# Patient Record
Sex: Female | Born: 1945 | Race: White | Hispanic: No | Smoking: Former smoker
Health system: Southern US, Community
[De-identification: ages and names within clinical notes are randomized; demographics above are authoritative.]

## PROBLEM LIST (undated history)

## (undated) DIAGNOSIS — N390 Urinary tract infection, site not specified: Secondary | ICD-10-CM

## (undated) DIAGNOSIS — M25519 Pain in unspecified shoulder: Secondary | ICD-10-CM

## (undated) DIAGNOSIS — M26609 Unspecified temporomandibular joint disorder, unspecified side: Secondary | ICD-10-CM

## (undated) DIAGNOSIS — E663 Overweight: Secondary | ICD-10-CM

## (undated) DIAGNOSIS — Z1211 Encounter for screening for malignant neoplasm of colon: Secondary | ICD-10-CM

## (undated) DIAGNOSIS — E785 Hyperlipidemia, unspecified: Secondary | ICD-10-CM

## (undated) DIAGNOSIS — K219 Gastro-esophageal reflux disease without esophagitis: Secondary | ICD-10-CM

## (undated) DIAGNOSIS — M1990 Unspecified osteoarthritis, unspecified site: Secondary | ICD-10-CM

## (undated) DIAGNOSIS — R739 Hyperglycemia, unspecified: Secondary | ICD-10-CM

## (undated) DIAGNOSIS — M542 Cervicalgia: Secondary | ICD-10-CM

## (undated) DIAGNOSIS — R945 Abnormal results of liver function studies: Secondary | ICD-10-CM

## (undated) DIAGNOSIS — N200 Calculus of kidney: Secondary | ICD-10-CM

## (undated) DIAGNOSIS — Z8619 Personal history of other infectious and parasitic diseases: Secondary | ICD-10-CM

## (undated) HISTORY — DX: Calculus of kidney: N20.0

## (undated) HISTORY — DX: Encounter for screening for malignant neoplasm of colon: Z12.11

## (undated) HISTORY — DX: Hyperlipidemia, unspecified: E78.5

## (undated) HISTORY — DX: Personal history of other infectious and parasitic diseases: Z86.19

## (undated) HISTORY — DX: Pain in unspecified shoulder: M25.519

## (undated) HISTORY — DX: Cervicalgia: M54.2

## (undated) HISTORY — DX: Unspecified temporomandibular joint disorder, unspecified side: M26.609

## (undated) HISTORY — DX: Gastro-esophageal reflux disease without esophagitis: K21.9

## (undated) HISTORY — DX: Urinary tract infection, site not specified: N39.0

## (undated) HISTORY — DX: Unspecified osteoarthritis, unspecified site: M19.90

## (undated) HISTORY — DX: Overweight: E66.3

## (undated) HISTORY — DX: Hyperglycemia, unspecified: R73.9

## (undated) HISTORY — DX: Abnormal results of liver function studies: R94.5

---

## 1964-11-29 HISTORY — PX: APPENDECTOMY: SHX54

## 1997-08-28 HISTORY — PX: OTHER SURGICAL HISTORY: SHX169

## 2000-07-22 ENCOUNTER — Emergency Department (HOSPITAL_COMMUNITY): Admission: EM | Admit: 2000-07-22 | Discharge: 2000-07-22 | Payer: Self-pay | Admitting: Emergency Medicine

## 2000-07-23 ENCOUNTER — Encounter: Payer: Self-pay | Admitting: Emergency Medicine

## 2002-08-28 HISTORY — PX: ULNAR SHORTENING WITH BONE GRAFT: SHX6330

## 2004-09-15 HISTORY — PX: CARPAL TUNNEL RELEASE: SHX101

## 2005-06-21 HISTORY — PX: ENDOSCOPIC PLANTAR FASCIOTOMY: SUR443

## 2006-05-12 HISTORY — PX: OTHER SURGICAL HISTORY: SHX169

## 2007-11-02 HISTORY — PX: OTHER SURGICAL HISTORY: SHX169

## 2008-08-07 ENCOUNTER — Encounter
Admission: RE | Admit: 2008-08-07 | Discharge: 2008-09-04 | Payer: Self-pay | Admitting: Physical Medicine and Rehabilitation

## 2008-10-02 ENCOUNTER — Encounter
Admission: RE | Admit: 2008-10-02 | Discharge: 2008-11-27 | Payer: Self-pay | Admitting: Physical Medicine & Rehabilitation

## 2008-12-04 ENCOUNTER — Encounter
Admission: RE | Admit: 2008-12-04 | Discharge: 2009-03-04 | Payer: Self-pay | Admitting: Physical Medicine & Rehabilitation

## 2009-02-27 HISTORY — PX: EXTRACORPOREAL SHOCK WAVE LITHOTRIPSY: SHX1557

## 2009-03-03 HISTORY — PX: CATARACT EXTRACTION: SUR2

## 2009-03-03 HISTORY — PX: OTHER SURGICAL HISTORY: SHX169

## 2009-03-17 HISTORY — PX: CATARACT EXTRACTION: SUR2

## 2009-04-07 ENCOUNTER — Encounter
Admission: RE | Admit: 2009-04-07 | Discharge: 2009-07-06 | Payer: Self-pay | Admitting: Physical Medicine & Rehabilitation

## 2010-03-11 HISTORY — PX: SEPTOPLASTY: SUR1290

## 2010-12-09 HISTORY — PX: SHOULDER SURGERY: SHX246

## 2010-12-22 ENCOUNTER — Encounter
Admission: RE | Admit: 2010-12-22 | Discharge: 2010-12-29 | Payer: Self-pay | Source: Home / Self Care | Attending: Sports Medicine | Admitting: Sports Medicine

## 2010-12-31 ENCOUNTER — Ambulatory Visit: Payer: Managed Care, Other (non HMO) | Attending: Sports Medicine | Admitting: Physical Therapy

## 2010-12-31 DIAGNOSIS — IMO0001 Reserved for inherently not codable concepts without codable children: Secondary | ICD-10-CM | POA: Insufficient documentation

## 2010-12-31 DIAGNOSIS — M6281 Muscle weakness (generalized): Secondary | ICD-10-CM | POA: Insufficient documentation

## 2010-12-31 DIAGNOSIS — M25519 Pain in unspecified shoulder: Secondary | ICD-10-CM | POA: Insufficient documentation

## 2010-12-31 DIAGNOSIS — M542 Cervicalgia: Secondary | ICD-10-CM | POA: Insufficient documentation

## 2010-12-31 DIAGNOSIS — M25619 Stiffness of unspecified shoulder, not elsewhere classified: Secondary | ICD-10-CM | POA: Insufficient documentation

## 2011-01-05 ENCOUNTER — Ambulatory Visit: Payer: Managed Care, Other (non HMO) | Admitting: Physical Therapy

## 2011-01-08 ENCOUNTER — Ambulatory Visit: Payer: Managed Care, Other (non HMO) | Admitting: Physical Therapy

## 2011-01-11 ENCOUNTER — Ambulatory Visit: Payer: Managed Care, Other (non HMO) | Admitting: Physical Therapy

## 2011-01-12 ENCOUNTER — Ambulatory Visit: Payer: Managed Care, Other (non HMO) | Admitting: Physical Therapy

## 2011-01-15 ENCOUNTER — Ambulatory Visit: Payer: Managed Care, Other (non HMO) | Admitting: Physical Therapy

## 2011-01-20 ENCOUNTER — Ambulatory Visit: Payer: Managed Care, Other (non HMO) | Admitting: Physical Therapy

## 2011-01-22 ENCOUNTER — Ambulatory Visit: Payer: Managed Care, Other (non HMO) | Admitting: Physical Therapy

## 2011-01-25 ENCOUNTER — Ambulatory Visit: Payer: Managed Care, Other (non HMO) | Admitting: Physical Therapy

## 2011-01-27 ENCOUNTER — Ambulatory Visit: Payer: Managed Care, Other (non HMO) | Admitting: Physical Therapy

## 2011-02-01 ENCOUNTER — Ambulatory Visit: Attending: Sports Medicine | Admitting: Physical Therapy

## 2011-02-01 DIAGNOSIS — M4802 Spinal stenosis, cervical region: Secondary | ICD-10-CM | POA: Insufficient documentation

## 2011-02-01 DIAGNOSIS — M25519 Pain in unspecified shoulder: Secondary | ICD-10-CM | POA: Insufficient documentation

## 2011-02-01 DIAGNOSIS — IMO0001 Reserved for inherently not codable concepts without codable children: Secondary | ICD-10-CM | POA: Insufficient documentation

## 2011-02-01 DIAGNOSIS — M6281 Muscle weakness (generalized): Secondary | ICD-10-CM | POA: Insufficient documentation

## 2011-02-01 DIAGNOSIS — M25619 Stiffness of unspecified shoulder, not elsewhere classified: Secondary | ICD-10-CM | POA: Insufficient documentation

## 2011-02-03 ENCOUNTER — Ambulatory Visit: Admitting: Physical Therapy

## 2011-02-08 ENCOUNTER — Ambulatory Visit: Admitting: Physical Therapy

## 2011-02-10 ENCOUNTER — Ambulatory Visit: Admitting: Physical Therapy

## 2011-02-15 ENCOUNTER — Ambulatory Visit: Admitting: Physical Therapy

## 2011-02-17 ENCOUNTER — Ambulatory Visit: Admitting: Physical Therapy

## 2011-02-23 ENCOUNTER — Ambulatory Visit: Admitting: Physical Therapy

## 2011-02-26 ENCOUNTER — Ambulatory Visit: Admitting: Physical Therapy

## 2011-03-15 ENCOUNTER — Ambulatory Visit: Attending: Physical Medicine & Rehabilitation | Admitting: Physical Therapy

## 2011-03-15 DIAGNOSIS — M25519 Pain in unspecified shoulder: Secondary | ICD-10-CM | POA: Insufficient documentation

## 2011-03-15 DIAGNOSIS — M4802 Spinal stenosis, cervical region: Secondary | ICD-10-CM | POA: Insufficient documentation

## 2011-03-15 DIAGNOSIS — M6281 Muscle weakness (generalized): Secondary | ICD-10-CM | POA: Insufficient documentation

## 2011-03-15 DIAGNOSIS — IMO0001 Reserved for inherently not codable concepts without codable children: Secondary | ICD-10-CM | POA: Insufficient documentation

## 2011-03-15 DIAGNOSIS — M25619 Stiffness of unspecified shoulder, not elsewhere classified: Secondary | ICD-10-CM | POA: Insufficient documentation

## 2011-03-19 ENCOUNTER — Ambulatory Visit: Admitting: Physical Therapy

## 2011-03-22 ENCOUNTER — Ambulatory Visit: Admitting: Physical Therapy

## 2011-03-25 ENCOUNTER — Ambulatory Visit: Admitting: Physical Therapy

## 2011-03-30 ENCOUNTER — Ambulatory Visit: Attending: Physical Medicine & Rehabilitation | Admitting: Physical Therapy

## 2011-03-30 DIAGNOSIS — M25619 Stiffness of unspecified shoulder, not elsewhere classified: Secondary | ICD-10-CM | POA: Insufficient documentation

## 2011-03-30 DIAGNOSIS — M542 Cervicalgia: Secondary | ICD-10-CM | POA: Insufficient documentation

## 2011-03-30 DIAGNOSIS — IMO0001 Reserved for inherently not codable concepts without codable children: Secondary | ICD-10-CM | POA: Insufficient documentation

## 2011-03-30 DIAGNOSIS — M25519 Pain in unspecified shoulder: Secondary | ICD-10-CM | POA: Insufficient documentation

## 2011-03-30 DIAGNOSIS — M6281 Muscle weakness (generalized): Secondary | ICD-10-CM | POA: Insufficient documentation

## 2011-03-30 DIAGNOSIS — M4802 Spinal stenosis, cervical region: Secondary | ICD-10-CM | POA: Insufficient documentation

## 2011-04-02 ENCOUNTER — Ambulatory Visit: Admitting: Physical Therapy

## 2011-04-05 ENCOUNTER — Ambulatory Visit: Attending: Physical Medicine & Rehabilitation | Admitting: Physical Therapy

## 2011-04-07 ENCOUNTER — Ambulatory Visit: Admitting: Physical Therapy

## 2011-05-10 ENCOUNTER — Ambulatory Visit: Attending: Physical Medicine & Rehabilitation | Admitting: Physical Therapy

## 2011-05-10 DIAGNOSIS — M25619 Stiffness of unspecified shoulder, not elsewhere classified: Secondary | ICD-10-CM | POA: Insufficient documentation

## 2011-05-10 DIAGNOSIS — M6281 Muscle weakness (generalized): Secondary | ICD-10-CM | POA: Insufficient documentation

## 2011-05-10 DIAGNOSIS — M25519 Pain in unspecified shoulder: Secondary | ICD-10-CM | POA: Insufficient documentation

## 2011-05-10 DIAGNOSIS — M4802 Spinal stenosis, cervical region: Secondary | ICD-10-CM | POA: Insufficient documentation

## 2011-05-10 DIAGNOSIS — IMO0001 Reserved for inherently not codable concepts without codable children: Secondary | ICD-10-CM | POA: Insufficient documentation

## 2011-05-13 ENCOUNTER — Ambulatory Visit: Admitting: Physical Therapy

## 2011-05-18 ENCOUNTER — Ambulatory Visit: Admitting: Physical Therapy

## 2011-05-20 ENCOUNTER — Ambulatory Visit: Admitting: Physical Therapy

## 2011-05-24 ENCOUNTER — Ambulatory Visit: Admitting: Physical Therapy

## 2011-05-25 ENCOUNTER — Encounter: Payer: Self-pay | Admitting: Physical Therapy

## 2011-05-27 ENCOUNTER — Ambulatory Visit: Admitting: Physical Therapy

## 2011-06-01 ENCOUNTER — Ambulatory Visit: Attending: Physical Medicine & Rehabilitation | Admitting: Physical Therapy

## 2011-06-01 DIAGNOSIS — M4802 Spinal stenosis, cervical region: Secondary | ICD-10-CM | POA: Insufficient documentation

## 2011-06-01 DIAGNOSIS — M25519 Pain in unspecified shoulder: Secondary | ICD-10-CM | POA: Insufficient documentation

## 2011-06-01 DIAGNOSIS — M25619 Stiffness of unspecified shoulder, not elsewhere classified: Secondary | ICD-10-CM | POA: Insufficient documentation

## 2011-06-01 DIAGNOSIS — M6281 Muscle weakness (generalized): Secondary | ICD-10-CM | POA: Insufficient documentation

## 2011-06-01 DIAGNOSIS — IMO0001 Reserved for inherently not codable concepts without codable children: Secondary | ICD-10-CM | POA: Insufficient documentation

## 2011-06-03 ENCOUNTER — Ambulatory Visit: Admitting: Physical Therapy

## 2011-07-27 ENCOUNTER — Ambulatory Visit: Attending: Physical Medicine & Rehabilitation | Admitting: Physical Therapy

## 2011-07-27 DIAGNOSIS — M25619 Stiffness of unspecified shoulder, not elsewhere classified: Secondary | ICD-10-CM | POA: Insufficient documentation

## 2011-07-27 DIAGNOSIS — M6281 Muscle weakness (generalized): Secondary | ICD-10-CM | POA: Insufficient documentation

## 2011-07-27 DIAGNOSIS — M542 Cervicalgia: Secondary | ICD-10-CM | POA: Insufficient documentation

## 2011-07-27 DIAGNOSIS — M25519 Pain in unspecified shoulder: Secondary | ICD-10-CM | POA: Insufficient documentation

## 2011-07-27 DIAGNOSIS — M4802 Spinal stenosis, cervical region: Secondary | ICD-10-CM | POA: Insufficient documentation

## 2011-07-27 DIAGNOSIS — IMO0001 Reserved for inherently not codable concepts without codable children: Secondary | ICD-10-CM | POA: Insufficient documentation

## 2011-07-29 ENCOUNTER — Ambulatory Visit: Attending: Physical Medicine & Rehabilitation | Admitting: Physical Therapy

## 2011-07-29 DIAGNOSIS — M6281 Muscle weakness (generalized): Secondary | ICD-10-CM | POA: Insufficient documentation

## 2011-07-29 DIAGNOSIS — R293 Abnormal posture: Secondary | ICD-10-CM | POA: Insufficient documentation

## 2011-07-29 DIAGNOSIS — M546 Pain in thoracic spine: Secondary | ICD-10-CM | POA: Insufficient documentation

## 2011-07-29 DIAGNOSIS — M4802 Spinal stenosis, cervical region: Secondary | ICD-10-CM | POA: Insufficient documentation

## 2011-07-29 DIAGNOSIS — M542 Cervicalgia: Secondary | ICD-10-CM | POA: Insufficient documentation

## 2011-07-29 DIAGNOSIS — IMO0001 Reserved for inherently not codable concepts without codable children: Secondary | ICD-10-CM | POA: Insufficient documentation

## 2011-08-03 ENCOUNTER — Ambulatory Visit: Attending: Physical Medicine & Rehabilitation | Admitting: Physical Therapy

## 2011-08-03 DIAGNOSIS — R293 Abnormal posture: Secondary | ICD-10-CM | POA: Insufficient documentation

## 2011-08-03 DIAGNOSIS — M546 Pain in thoracic spine: Secondary | ICD-10-CM | POA: Insufficient documentation

## 2011-08-03 DIAGNOSIS — IMO0001 Reserved for inherently not codable concepts without codable children: Secondary | ICD-10-CM | POA: Insufficient documentation

## 2011-08-03 DIAGNOSIS — M542 Cervicalgia: Secondary | ICD-10-CM | POA: Insufficient documentation

## 2011-08-03 DIAGNOSIS — M6281 Muscle weakness (generalized): Secondary | ICD-10-CM | POA: Insufficient documentation

## 2011-08-03 DIAGNOSIS — M4802 Spinal stenosis, cervical region: Secondary | ICD-10-CM | POA: Insufficient documentation

## 2011-08-06 ENCOUNTER — Ambulatory Visit: Admitting: Physical Therapy

## 2011-08-10 ENCOUNTER — Ambulatory Visit: Admitting: Physical Therapy

## 2011-08-12 ENCOUNTER — Ambulatory Visit: Admitting: Physical Therapy

## 2011-08-16 ENCOUNTER — Ambulatory Visit: Admitting: Physical Therapy

## 2011-08-19 ENCOUNTER — Ambulatory Visit: Admitting: Physical Therapy

## 2011-08-24 ENCOUNTER — Ambulatory Visit: Admitting: Physical Therapy

## 2011-08-26 ENCOUNTER — Ambulatory Visit: Admitting: Physical Therapy

## 2011-08-31 ENCOUNTER — Ambulatory Visit: Attending: Physical Medicine & Rehabilitation | Admitting: Physical Therapy

## 2011-08-31 DIAGNOSIS — R293 Abnormal posture: Secondary | ICD-10-CM | POA: Insufficient documentation

## 2011-08-31 DIAGNOSIS — M6281 Muscle weakness (generalized): Secondary | ICD-10-CM | POA: Insufficient documentation

## 2011-08-31 DIAGNOSIS — M546 Pain in thoracic spine: Secondary | ICD-10-CM | POA: Insufficient documentation

## 2011-08-31 DIAGNOSIS — M4802 Spinal stenosis, cervical region: Secondary | ICD-10-CM | POA: Insufficient documentation

## 2011-08-31 DIAGNOSIS — M542 Cervicalgia: Secondary | ICD-10-CM | POA: Insufficient documentation

## 2011-08-31 DIAGNOSIS — IMO0001 Reserved for inherently not codable concepts without codable children: Secondary | ICD-10-CM | POA: Insufficient documentation

## 2011-09-02 ENCOUNTER — Ambulatory Visit: Admitting: Physical Therapy

## 2011-09-07 ENCOUNTER — Ambulatory Visit: Admitting: Physical Therapy

## 2011-11-03 DIAGNOSIS — M25562 Pain in left knee: Secondary | ICD-10-CM | POA: Insufficient documentation

## 2012-03-02 DIAGNOSIS — S4392XA Sprain of unspecified parts of left shoulder girdle, initial encounter: Secondary | ICD-10-CM | POA: Insufficient documentation

## 2012-03-02 DIAGNOSIS — IMO0002 Reserved for concepts with insufficient information to code with codable children: Secondary | ICD-10-CM | POA: Insufficient documentation

## 2012-03-09 ENCOUNTER — Ambulatory Visit: Attending: Physical Medicine & Rehabilitation | Admitting: Physical Therapy

## 2012-03-09 DIAGNOSIS — M2560 Stiffness of unspecified joint, not elsewhere classified: Secondary | ICD-10-CM | POA: Insufficient documentation

## 2012-03-09 DIAGNOSIS — M4802 Spinal stenosis, cervical region: Secondary | ICD-10-CM | POA: Insufficient documentation

## 2012-03-09 DIAGNOSIS — IMO0001 Reserved for inherently not codable concepts without codable children: Secondary | ICD-10-CM | POA: Insufficient documentation

## 2012-03-09 DIAGNOSIS — M2550 Pain in unspecified joint: Secondary | ICD-10-CM | POA: Insufficient documentation

## 2012-03-09 DIAGNOSIS — M6281 Muscle weakness (generalized): Secondary | ICD-10-CM | POA: Insufficient documentation

## 2012-03-09 DIAGNOSIS — R293 Abnormal posture: Secondary | ICD-10-CM | POA: Insufficient documentation

## 2012-03-14 ENCOUNTER — Ambulatory Visit: Attending: Physical Medicine & Rehabilitation | Admitting: Physical Therapy

## 2012-03-14 DIAGNOSIS — M2550 Pain in unspecified joint: Secondary | ICD-10-CM | POA: Insufficient documentation

## 2012-03-14 DIAGNOSIS — M4802 Spinal stenosis, cervical region: Secondary | ICD-10-CM | POA: Insufficient documentation

## 2012-03-14 DIAGNOSIS — IMO0001 Reserved for inherently not codable concepts without codable children: Secondary | ICD-10-CM | POA: Insufficient documentation

## 2012-03-14 DIAGNOSIS — R293 Abnormal posture: Secondary | ICD-10-CM | POA: Insufficient documentation

## 2012-03-14 DIAGNOSIS — M2560 Stiffness of unspecified joint, not elsewhere classified: Secondary | ICD-10-CM | POA: Insufficient documentation

## 2012-03-14 DIAGNOSIS — M6281 Muscle weakness (generalized): Secondary | ICD-10-CM | POA: Insufficient documentation

## 2012-03-16 ENCOUNTER — Ambulatory Visit: Admitting: Physical Therapy

## 2012-03-21 ENCOUNTER — Ambulatory Visit: Admitting: Physical Therapy

## 2012-03-23 ENCOUNTER — Ambulatory Visit: Admitting: Physical Therapy

## 2012-03-28 ENCOUNTER — Ambulatory Visit: Admitting: Physical Therapy

## 2012-03-30 ENCOUNTER — Ambulatory Visit: Attending: Physical Medicine & Rehabilitation | Admitting: Physical Therapy

## 2012-03-30 DIAGNOSIS — R293 Abnormal posture: Secondary | ICD-10-CM | POA: Insufficient documentation

## 2012-03-30 DIAGNOSIS — IMO0001 Reserved for inherently not codable concepts without codable children: Secondary | ICD-10-CM | POA: Insufficient documentation

## 2012-03-30 DIAGNOSIS — M2560 Stiffness of unspecified joint, not elsewhere classified: Secondary | ICD-10-CM | POA: Insufficient documentation

## 2012-03-30 DIAGNOSIS — M2550 Pain in unspecified joint: Secondary | ICD-10-CM | POA: Insufficient documentation

## 2012-03-30 DIAGNOSIS — M6281 Muscle weakness (generalized): Secondary | ICD-10-CM | POA: Insufficient documentation

## 2012-03-30 DIAGNOSIS — M4802 Spinal stenosis, cervical region: Secondary | ICD-10-CM | POA: Insufficient documentation

## 2012-04-03 ENCOUNTER — Ambulatory Visit: Admitting: Physical Therapy

## 2012-10-19 DIAGNOSIS — M5412 Radiculopathy, cervical region: Secondary | ICD-10-CM | POA: Insufficient documentation

## 2012-10-19 DIAGNOSIS — G250 Essential tremor: Secondary | ICD-10-CM | POA: Insufficient documentation

## 2012-10-24 ENCOUNTER — Ambulatory Visit: Attending: Physical Medicine & Rehabilitation | Admitting: Physical Therapy

## 2012-10-24 ENCOUNTER — Ambulatory Visit: Payer: Self-pay | Admitting: Physical Therapy

## 2012-10-24 DIAGNOSIS — R209 Unspecified disturbances of skin sensation: Secondary | ICD-10-CM | POA: Insufficient documentation

## 2012-10-24 DIAGNOSIS — M6281 Muscle weakness (generalized): Secondary | ICD-10-CM | POA: Insufficient documentation

## 2012-10-24 DIAGNOSIS — M542 Cervicalgia: Secondary | ICD-10-CM | POA: Insufficient documentation

## 2012-10-24 DIAGNOSIS — M4802 Spinal stenosis, cervical region: Secondary | ICD-10-CM | POA: Insufficient documentation

## 2012-10-24 DIAGNOSIS — IMO0001 Reserved for inherently not codable concepts without codable children: Secondary | ICD-10-CM | POA: Insufficient documentation

## 2012-10-24 DIAGNOSIS — R293 Abnormal posture: Secondary | ICD-10-CM | POA: Insufficient documentation

## 2012-10-30 ENCOUNTER — Ambulatory Visit: Attending: Physical Medicine & Rehabilitation | Admitting: Physical Therapy

## 2012-10-30 DIAGNOSIS — M6281 Muscle weakness (generalized): Secondary | ICD-10-CM | POA: Insufficient documentation

## 2012-10-30 DIAGNOSIS — M542 Cervicalgia: Secondary | ICD-10-CM | POA: Insufficient documentation

## 2012-10-30 DIAGNOSIS — R209 Unspecified disturbances of skin sensation: Secondary | ICD-10-CM | POA: Insufficient documentation

## 2012-10-30 DIAGNOSIS — M4802 Spinal stenosis, cervical region: Secondary | ICD-10-CM | POA: Insufficient documentation

## 2012-10-30 DIAGNOSIS — R293 Abnormal posture: Secondary | ICD-10-CM | POA: Insufficient documentation

## 2012-10-30 DIAGNOSIS — IMO0001 Reserved for inherently not codable concepts without codable children: Secondary | ICD-10-CM | POA: Insufficient documentation

## 2012-11-02 ENCOUNTER — Ambulatory Visit: Admitting: Physical Therapy

## 2012-11-06 ENCOUNTER — Ambulatory Visit: Attending: Physical Medicine & Rehabilitation | Admitting: Physical Therapy

## 2012-11-06 DIAGNOSIS — R209 Unspecified disturbances of skin sensation: Secondary | ICD-10-CM | POA: Insufficient documentation

## 2012-11-06 DIAGNOSIS — IMO0001 Reserved for inherently not codable concepts without codable children: Secondary | ICD-10-CM | POA: Insufficient documentation

## 2012-11-06 DIAGNOSIS — M6281 Muscle weakness (generalized): Secondary | ICD-10-CM | POA: Insufficient documentation

## 2012-11-06 DIAGNOSIS — M542 Cervicalgia: Secondary | ICD-10-CM | POA: Insufficient documentation

## 2012-11-06 DIAGNOSIS — R293 Abnormal posture: Secondary | ICD-10-CM | POA: Insufficient documentation

## 2012-11-06 DIAGNOSIS — M4802 Spinal stenosis, cervical region: Secondary | ICD-10-CM | POA: Insufficient documentation

## 2012-11-08 ENCOUNTER — Ambulatory Visit: Attending: Physical Medicine & Rehabilitation | Admitting: Physical Therapy

## 2012-11-08 DIAGNOSIS — IMO0001 Reserved for inherently not codable concepts without codable children: Secondary | ICD-10-CM | POA: Insufficient documentation

## 2012-11-08 DIAGNOSIS — M4802 Spinal stenosis, cervical region: Secondary | ICD-10-CM | POA: Insufficient documentation

## 2012-11-08 DIAGNOSIS — R209 Unspecified disturbances of skin sensation: Secondary | ICD-10-CM | POA: Insufficient documentation

## 2012-11-08 DIAGNOSIS — R293 Abnormal posture: Secondary | ICD-10-CM | POA: Insufficient documentation

## 2012-11-08 DIAGNOSIS — M542 Cervicalgia: Secondary | ICD-10-CM | POA: Insufficient documentation

## 2012-11-08 DIAGNOSIS — M6281 Muscle weakness (generalized): Secondary | ICD-10-CM | POA: Insufficient documentation

## 2013-03-27 HISTORY — PX: TMJ ARTHROPLASTY: SHX1066

## 2013-05-07 DIAGNOSIS — R252 Cramp and spasm: Secondary | ICD-10-CM | POA: Insufficient documentation

## 2013-10-09 ENCOUNTER — Ambulatory Visit (INDEPENDENT_AMBULATORY_CARE_PROVIDER_SITE_OTHER): Payer: PRIVATE HEALTH INSURANCE

## 2013-10-09 VITALS — Ht 66.0 in | Wt 157.0 lb

## 2013-10-09 DIAGNOSIS — R52 Pain, unspecified: Secondary | ICD-10-CM

## 2013-10-09 DIAGNOSIS — M722 Plantar fascial fibromatosis: Secondary | ICD-10-CM

## 2013-10-09 MED ORDER — PREDNISONE 10 MG PO KIT: 1.0000 | PACK | Freq: Four times a day (QID) | ORAL | Status: DC

## 2013-10-09 NOTE — Progress Notes (Signed)
  Subjective:    Patient ID: Rebecca Lewis, female    DOB: 05-14-1946, 67 y.o.   MRN: [id number]  HPI Comments: '' THE RT BOTTOM OF THE HEEL IS HURTING SINCE MAY OF THIS YEAR. FLEXING AND PRESSURE  MAKE IT WORSE. I TREAT WITH ICE FOR 20 MINUTES, BUT NO RELIEF.''     Review of Systems  All other systems reviewed and are negative.       Objective:   Physical Exam Neurovascular status is intact. Pedal pulses palpable epicritic and proprioceptive sensations intact. X-rays taken this time reveal mild inferior calcaneal spurring no fracture roughly high arch cavovarus foot type with mild flexible digital contractures noted on projection patient still wearing orthoses which actually fit and contour well although maybe flattening somewhat over time. 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 Also recommended warm compress ice pack to the inferior heel area a. Milt Coye shell arch pad is also applied to her right orthotic to assist with he'll support this may indicate she may need new orthoses based on progress recheck in the next one to 2 months for followup and reevaluation.  Alvan Dame DPM

## 2013-10-09 NOTE — Patient Instructions (Signed)
ICE INSTRUCTIONS  Apply ice or cold pack to the affected area at least 3 times a day for 10-15 minutes each time.  You should also use ice after prolonged activity or vigorous exercise.  Do not apply ice longer than 20 minutes at one time.  Always keep a cloth between your skin and the ice pack to prevent burns.  Being consistent and following these instructions will help control your symptoms.  We suggest you purchase a gel ice pack because they are reusable and do bit leak.  Some of them are designed to wrap around the area.  Use the method that works best for you.  Here are some other suggestions for icing.   Use a frozen bag of peas or corn-inexpensive and molds well to your body, usually stays frozen for 10 to 20 minutes.  Wet a towel with cold water and squeeze out the excess until it's damp.  Place in a bag in the freezer for 20 minutes. Then remove and use.   Alternate hot and cold compresses: Apply a washcloth moistened with hot water for 10 minutes then apply ice pack for 10 minutes repeat this 2-3 times. Again alternate hot and cold hot and cold always finish with cold

## 2013-11-06 ENCOUNTER — Ambulatory Visit: Payer: PRIVATE HEALTH INSURANCE

## 2013-11-14 ENCOUNTER — Ambulatory Visit (INDEPENDENT_AMBULATORY_CARE_PROVIDER_SITE_OTHER): Payer: PRIVATE HEALTH INSURANCE

## 2013-11-14 VITALS — BP 116/60 | HR 74 | Resp 16 | Ht 66.5 in | Wt 160.0 lb

## 2013-11-14 DIAGNOSIS — R52 Pain, unspecified: Secondary | ICD-10-CM

## 2013-11-14 DIAGNOSIS — M722 Plantar fascial fibromatosis: Secondary | ICD-10-CM

## 2013-11-14 MED ORDER — MELOXICAM 15 MG PO TABS: 15.0000 mg | ORAL_TABLET | Freq: Every day | ORAL | Status: DC

## 2013-11-14 NOTE — Progress Notes (Signed)
   Subjective:    Patient ID: Ariday Brinker, female    DOB: 1946/05/08, 67 y.o.   MRN: [id number]                                   "It's doing better but it's still there.  I got the results for the Uric acid, it was 3.6.  I want him to make an adjustment to my old pair of orthotics too."  HPI    Review of Systems  All other systems reviewed and are negative.       Objective:   Physical Exam Neurovascular status is intact. Patient continues to have some mild plantar fascial pain medial right arch the freezer tonic was adjusted this time felt had corporations and into the medial arch of the right orthotic another pair of orthotics. Maintain orthotics also maintain heat and ice therapy every evening. There is no other significant changes to have some improvement with the orthotic adjustment. Discussed positive for steroid injection however we'll consider this on an as-needed basis in the future Dosepak definitely helped in reducing the symptomology.       Assessment & Plan:  Assessment plantar fasciitis/heel spur syndrome right heel exacerbated recently treated with an Dosepak and some Desitin orthotics which are carried out at this time. Patient will recheck in the future and as-needed basis suggest a possible 3 months for long-term followup.  For Mobic is given as alternative to naproxen sodium patient been using over-the-counter Aleve utilizing high doses of sodium with her medications as doubled doubling on the proximal and suggested as Mobic as alternative 50 mg once daily reduce the amount sodium and reducible swelling in her ankles as well.  Alvan Dame DPM

## 2013-11-14 NOTE — Patient Instructions (Signed)

## 2013-12-10 ENCOUNTER — Telehealth: Payer: Self-pay | Admitting: *Deleted

## 2013-12-10 NOTE — Telephone Encounter (Signed)
9:57am Patient called and stated I'm having side effects from the Mobic so I stopped taking it.  I had some Voltaren Gel that was prescribed from another doctor that I've been using.  It seems to help.  My question is does he think physical therapy will help.  I have someone in mind I'd like to use if so.  Or, does he want me to come in to see him again?  I called and informed her I'm waiting on a response from Dr. Ralene CorkSikora.

## 2013-12-10 NOTE — Telephone Encounter (Signed)
Entered in error

## 2013-12-11 NOTE — Telephone Encounter (Signed)
Yes physical therapy for her heel and arch is may be beneficial. If she has some edema in the can provide physical therapy be a good option clubbing noted anything that can do to help with that therapy.  I agree discontinue the Mobic if is causing any GI side effects or other symptoms. Use the Voltaren gel as alternative.

## 2013-12-13 ENCOUNTER — Telehealth: Payer: Self-pay | Admitting: *Deleted

## 2013-12-13 DIAGNOSIS — M722 Plantar fascial fibromatosis: Secondary | ICD-10-CM

## 2013-12-13 MED ORDER — DICLOFENAC SODIUM 1 % TD GEL: 4.0000 g | Freq: Two times a day (BID) | TRANSDERMAL | Status: DC

## 2013-12-13 NOTE — Telephone Encounter (Signed)
I called and informed the patient that Dr. Ralene CorkSikora said it was good to have stopped the Mobic.  Voltaren Gel is good to use.  Physical therapy is a good option.  Patient requested a prescription for Voltaren Gel to [facility] [address]  [place], [place] [phone].  She also requests physical therapy from [facility] located at [address] [place], [place] phone # [phone], fax [phone].  She asked that we make sure physical therapy knows that it's workers comp.  I informed her I would get it taken care of as soon as Dr. Ralene CorkSikora gives me details for scheduling and okay for the prescription.

## 2013-12-13 NOTE — Telephone Encounter (Signed)
Blood Lydia as far as Ms. Silverio DecampLinda Mose  Voltaren gel apply a 4 mg to the affected area every 12 hours dispensed 30 day supply with 2 refill.  For physical therapy arrange physical therapy to evaluate and treat the plantar fascial area as follows ultrasound to the heel and arch. Also recommended iontophoresis with dexamethasone 4 mg per mL. Also stretching and range of motion exercises. Recommend physical therapy treat times a week for 4 week duration.  Alvan Dameichard Beverly Suriano DPM

## 2013-12-14 ENCOUNTER — Telehealth: Payer: Self-pay | Admitting: *Deleted

## 2013-12-14 NOTE — Telephone Encounter (Signed)
LM for pt. Informed her that Dr. Ralene CorkSikora did think that PT would help and to let us know if we needed to call the facility of her choice to set her up. Also informed pt that the discontinue Mobic and use Voltaren gel as an alternative per Dr Ralene CorkSikora.

## 2013-12-19 ENCOUNTER — Telehealth: Payer: Self-pay | Admitting: *Deleted

## 2013-12-20 NOTE — Telephone Encounter (Signed)
Faxed Rx for PT - Physical Therapy to include Iontophoresis with Dexamethasone 4mg  per ml 3 times a week for 4 weeks duration for right foot plantar fasciitis, to [phone].

## 2013-12-25 ENCOUNTER — Telehealth: Payer: Self-pay | Admitting: *Deleted

## 2013-12-25 NOTE — Telephone Encounter (Addendum)
Rebecca Lewis states they need more information for pt's PT for the [employer].  I left message to call and we would go over one-on-one.  Rebecca Lewis states [employer] requires specific orders from the referring doctor - doctor's handwritten signature, evaluate and treat for plantar fasciitis of designated foot to include Iontophoresis with Dexamethasone, and ICD9 code.  I wrote as:  Evaluate and treat for plantar fasciitis of the right foot to include Iontophoresis wiith Dexamethasone.  ICD9 code 22728.71.  I will have Rebecca Lewis sign.  See scanned rx dated 12/26/2013

## 2014-01-15 ENCOUNTER — Ambulatory Visit: Payer: Self-pay | Admitting: Physical Therapy

## 2014-01-17 ENCOUNTER — Ambulatory Visit (INDEPENDENT_AMBULATORY_CARE_PROVIDER_SITE_OTHER): Admitting: Physical Therapy

## 2014-01-17 DIAGNOSIS — M25579 Pain in unspecified ankle and joints of unspecified foot: Secondary | ICD-10-CM

## 2014-01-17 DIAGNOSIS — M6281 Muscle weakness (generalized): Secondary | ICD-10-CM

## 2014-01-17 DIAGNOSIS — M722 Plantar fascial fibromatosis: Secondary | ICD-10-CM

## 2014-01-17 DIAGNOSIS — M25673 Stiffness of unspecified ankle, not elsewhere classified: Secondary | ICD-10-CM

## 2014-01-17 DIAGNOSIS — M25676 Stiffness of unspecified foot, not elsewhere classified: Secondary | ICD-10-CM

## 2014-01-18 ENCOUNTER — Encounter: Payer: Self-pay | Admitting: Physical Therapy

## 2014-01-18 ENCOUNTER — Encounter (INDEPENDENT_AMBULATORY_CARE_PROVIDER_SITE_OTHER): Admitting: Physical Therapy

## 2014-01-18 DIAGNOSIS — M25673 Stiffness of unspecified ankle, not elsewhere classified: Secondary | ICD-10-CM

## 2014-01-18 DIAGNOSIS — M25579 Pain in unspecified ankle and joints of unspecified foot: Secondary | ICD-10-CM

## 2014-01-18 DIAGNOSIS — M722 Plantar fascial fibromatosis: Secondary | ICD-10-CM

## 2014-01-18 DIAGNOSIS — M6281 Muscle weakness (generalized): Secondary | ICD-10-CM

## 2014-01-18 DIAGNOSIS — M25676 Stiffness of unspecified foot, not elsewhere classified: Secondary | ICD-10-CM

## 2014-01-21 ENCOUNTER — Encounter (INDEPENDENT_AMBULATORY_CARE_PROVIDER_SITE_OTHER): Admitting: Physical Therapy

## 2014-01-21 DIAGNOSIS — M6281 Muscle weakness (generalized): Secondary | ICD-10-CM

## 2014-01-21 DIAGNOSIS — M25676 Stiffness of unspecified foot, not elsewhere classified: Secondary | ICD-10-CM

## 2014-01-21 DIAGNOSIS — M25579 Pain in unspecified ankle and joints of unspecified foot: Secondary | ICD-10-CM

## 2014-01-21 DIAGNOSIS — M25673 Stiffness of unspecified ankle, not elsewhere classified: Secondary | ICD-10-CM

## 2014-01-23 ENCOUNTER — Encounter (INDEPENDENT_AMBULATORY_CARE_PROVIDER_SITE_OTHER): Admitting: Physical Therapy

## 2014-01-23 DIAGNOSIS — M25673 Stiffness of unspecified ankle, not elsewhere classified: Secondary | ICD-10-CM

## 2014-01-23 DIAGNOSIS — M25676 Stiffness of unspecified foot, not elsewhere classified: Secondary | ICD-10-CM

## 2014-01-23 DIAGNOSIS — M722 Plantar fascial fibromatosis: Secondary | ICD-10-CM

## 2014-01-23 DIAGNOSIS — M6281 Muscle weakness (generalized): Secondary | ICD-10-CM

## 2014-01-23 DIAGNOSIS — M25579 Pain in unspecified ankle and joints of unspecified foot: Secondary | ICD-10-CM

## 2014-01-25 ENCOUNTER — Encounter (INDEPENDENT_AMBULATORY_CARE_PROVIDER_SITE_OTHER): Admitting: Physical Therapy

## 2014-01-25 DIAGNOSIS — M6281 Muscle weakness (generalized): Secondary | ICD-10-CM

## 2014-01-25 DIAGNOSIS — M25673 Stiffness of unspecified ankle, not elsewhere classified: Secondary | ICD-10-CM

## 2014-01-25 DIAGNOSIS — M12819 Other specific arthropathies, not elsewhere classified, unspecified shoulder: Secondary | ICD-10-CM

## 2014-01-25 DIAGNOSIS — M722 Plantar fascial fibromatosis: Secondary | ICD-10-CM

## 2014-01-25 DIAGNOSIS — M25676 Stiffness of unspecified foot, not elsewhere classified: Secondary | ICD-10-CM

## 2014-01-28 ENCOUNTER — Encounter (INDEPENDENT_AMBULATORY_CARE_PROVIDER_SITE_OTHER): Admitting: Physical Therapy

## 2014-01-28 DIAGNOSIS — M25676 Stiffness of unspecified foot, not elsewhere classified: Secondary | ICD-10-CM

## 2014-01-28 DIAGNOSIS — M25579 Pain in unspecified ankle and joints of unspecified foot: Secondary | ICD-10-CM

## 2014-01-28 DIAGNOSIS — M6281 Muscle weakness (generalized): Secondary | ICD-10-CM

## 2014-01-28 DIAGNOSIS — M722 Plantar fascial fibromatosis: Secondary | ICD-10-CM

## 2014-01-28 DIAGNOSIS — M25673 Stiffness of unspecified ankle, not elsewhere classified: Secondary | ICD-10-CM

## 2014-01-30 ENCOUNTER — Encounter (INDEPENDENT_AMBULATORY_CARE_PROVIDER_SITE_OTHER): Admitting: Physical Therapy

## 2014-01-30 DIAGNOSIS — M25579 Pain in unspecified ankle and joints of unspecified foot: Secondary | ICD-10-CM

## 2014-01-30 DIAGNOSIS — M722 Plantar fascial fibromatosis: Secondary | ICD-10-CM

## 2014-01-30 DIAGNOSIS — M25673 Stiffness of unspecified ankle, not elsewhere classified: Secondary | ICD-10-CM

## 2014-01-30 DIAGNOSIS — M6281 Muscle weakness (generalized): Secondary | ICD-10-CM

## 2014-01-30 DIAGNOSIS — M25676 Stiffness of unspecified foot, not elsewhere classified: Secondary | ICD-10-CM

## 2014-01-31 ENCOUNTER — Encounter (INDEPENDENT_AMBULATORY_CARE_PROVIDER_SITE_OTHER)

## 2014-01-31 DIAGNOSIS — M6281 Muscle weakness (generalized): Secondary | ICD-10-CM

## 2014-01-31 DIAGNOSIS — M722 Plantar fascial fibromatosis: Secondary | ICD-10-CM

## 2014-01-31 DIAGNOSIS — M25579 Pain in unspecified ankle and joints of unspecified foot: Secondary | ICD-10-CM

## 2014-01-31 DIAGNOSIS — M25673 Stiffness of unspecified ankle, not elsewhere classified: Secondary | ICD-10-CM

## 2014-01-31 DIAGNOSIS — M25676 Stiffness of unspecified foot, not elsewhere classified: Secondary | ICD-10-CM

## 2014-02-01 ENCOUNTER — Encounter: Admitting: Physical Therapy

## 2014-02-04 ENCOUNTER — Encounter (INDEPENDENT_AMBULATORY_CARE_PROVIDER_SITE_OTHER): Admitting: Physical Therapy

## 2014-02-04 DIAGNOSIS — M6281 Muscle weakness (generalized): Secondary | ICD-10-CM

## 2014-02-04 DIAGNOSIS — M25676 Stiffness of unspecified foot, not elsewhere classified: Secondary | ICD-10-CM

## 2014-02-04 DIAGNOSIS — M25673 Stiffness of unspecified ankle, not elsewhere classified: Secondary | ICD-10-CM

## 2014-02-04 DIAGNOSIS — M722 Plantar fascial fibromatosis: Secondary | ICD-10-CM

## 2014-02-04 DIAGNOSIS — M25579 Pain in unspecified ankle and joints of unspecified foot: Secondary | ICD-10-CM

## 2014-02-05 ENCOUNTER — Ambulatory Visit: Payer: PRIVATE HEALTH INSURANCE

## 2014-02-08 ENCOUNTER — Ambulatory Visit (INDEPENDENT_AMBULATORY_CARE_PROVIDER_SITE_OTHER): Payer: PRIVATE HEALTH INSURANCE

## 2014-02-08 VITALS — BP 124/62 | HR 81 | Resp 16

## 2014-02-08 DIAGNOSIS — M1990 Unspecified osteoarthritis, unspecified site: Secondary | ICD-10-CM

## 2014-02-08 DIAGNOSIS — M778 Other enthesopathies, not elsewhere classified: Secondary | ICD-10-CM

## 2014-02-08 DIAGNOSIS — M779 Enthesopathy, unspecified: Secondary | ICD-10-CM

## 2014-02-08 DIAGNOSIS — M722 Plantar fascial fibromatosis: Secondary | ICD-10-CM

## 2014-02-08 NOTE — Patient Instructions (Signed)
ICE INSTRUCTIONS  Apply ice or cold pack to the affected area at least 3 times a day for 10-15 minutes each time.  You should also use ice after prolonged activity or vigorous exercise.  Do not apply ice longer than 20 minutes at one time.  Always keep a cloth between your skin and the ice pack to prevent burns.  Being consistent and following these instructions will help control your symptoms.  We suggest you purchase a gel ice pack because they are reusable and do bit leak.  Some of them are designed to wrap around the area.  Use the method that works best for you.  Here are some other suggestions for icing.   Use a frozen bag of peas or corn-inexpensive and molds well to your body, usually stays frozen for 10 to 20 minutes.  Wet a towel with cold water and squeeze out the excess until it's damp.  Place in a bag in the freezer for 20 minutes. Then remove and use    Recommended continue with alternating hot and cold compresses warm compress for 10 minutes the ice pack for 10 minutes. Also may continue to apply Voltaren gel to the affected areas 2 or 3 times daily. Recommend followup in 6-12 months for reevaluation of feet and orthotics..Marland Kitchen

## 2014-02-08 NOTE — Progress Notes (Signed)
   Subjective:    Patient ID: Rebecca Lewis, female    DOB: 07/23/1946, 68 y.o.   MRN: [id number]  HPI Comments: "It is so much better than it was"  Follow up heel right     Review of Systems no new changes or find    Objective:   Physical Exam Neurovascular status is intact pedal pulses palpable patient continue physical therapy and is using the diclofenac gel as well as hot and cold compresses to the plantar fascia had improvement although will always have some degree of them discomfort and continued need for functional orthotic support both pairs recurred orthotics that she work and fit and contour well views are examined testing today suggest a 6-12 month long-term followup. 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 Followup in 6-12 months  Harriet Masson DPM

## 2014-04-09 HISTORY — PX: LITHOTRIPSY: SUR834

## 2014-11-26 ENCOUNTER — Ambulatory Visit (INDEPENDENT_AMBULATORY_CARE_PROVIDER_SITE_OTHER)

## 2014-11-26 ENCOUNTER — Ambulatory Visit

## 2014-11-26 ENCOUNTER — Other Ambulatory Visit: Payer: Self-pay

## 2014-11-26 VITALS — BP 118/69 | HR 82 | Resp 12

## 2014-11-26 DIAGNOSIS — S93401A Sprain of unspecified ligament of right ankle, initial encounter: Secondary | ICD-10-CM | POA: Diagnosis not present

## 2014-11-26 DIAGNOSIS — R52 Pain, unspecified: Secondary | ICD-10-CM

## 2014-11-26 DIAGNOSIS — R609 Edema, unspecified: Secondary | ICD-10-CM | POA: Diagnosis not present

## 2014-11-26 DIAGNOSIS — M722 Plantar fascial fibromatosis: Secondary | ICD-10-CM | POA: Diagnosis not present

## 2014-11-26 MED ORDER — MELOXICAM 15 MG PO TABS: 15.0000 mg | ORAL_TABLET | Freq: Every day | ORAL | Status: DC

## 2014-11-26 NOTE — Progress Notes (Signed)
   Subjective:    Patient ID: Rebecca DecampLinda Lewis, female    DOB: Aug 03, 1946, 68 y.o.   MRN: [id number]  HPI PT STATED INJURED STEP DOWN THE SIDE WALK AND RT FOOT ANKLE STILL SWOLLEN AND PAINFUL FOR 2 WEEKS. THE FOOT IS GETTING A LITTLE BETTER BUT SOMETIMES WORSE. THE FOOT GET AGGRAVATED BY PRESSURE. TRIED ICE, VOLTAREN GEL AND ALEVE  IT HELP SOME.   Review of Systems  Musculoskeletal: Positive for gait problem.  All other systems reviewed and are negative.      Objective:   Physical Exam  Neurovascular status is intact pedal pulses are palpable DP and PT +2 over 4 Refill time 3 seconds 2 [place] and inversion-type ankle sprain rolled her ankle is pain in the damage edema and ecchymosis lateral malleoli are area bilateral ankle right most significant pain in the sinus tarsi area overlying the anterior talofibular right. X-rays reveal no fracture no cysts no tumors no displacements soft tissue swelling in the sinus tarsi identified clinically and radiographically. Tenderness on Palpation plantar flexion and inversion in particular significant painful and applying ice and topical pain ligament and Voltaren gel.      Assessment & Plan:  Assessment moderate ankle sprain lateral ankle sprain likely anterior talofibular possible calcaneal fib ligament injury lateral right ankle plan at this time patient is placed and ankle stabilizer recommended ice and upper scripture for Modic is given recheck in 4 weeks or one month for follow-up maintain immobilization ice moderate activities may resume swimming for therapeutic benefits within a week otherwise 1 month follow-up for re-x-ray and reevaluation maintain ankle stabilizer at all times.  Alvan Dameichard Davita Sublett DPM

## 2014-11-26 NOTE — Patient Instructions (Signed)

## 2014-12-05 ENCOUNTER — Telehealth: Payer: Self-pay | Admitting: *Deleted

## 2014-12-05 NOTE — Telephone Encounter (Signed)
Maintain the ankle stabilizer for at least 3-4 weeks following an ankle sprain of the swelling has gone down the ligaments are still weak and can be easily reinjured next  Alvan Dameichard Yarden Manuelito DPM

## 2014-12-05 NOTE — Telephone Encounter (Signed)
Could you ask him please.  I think he's there on Thursdays.  Could you ask him, he put me in one of those compression cast last week.  I didn't know if the swelling has gone down if he wants me to continue wearing the compression cast until I see him at the end of this month.  Please call me back.  Thank you.

## 2014-12-06 NOTE — Telephone Encounter (Signed)
I returned her call and informed her to continue wearing the ankle stabilizer for 3-4 weeks per Dr. Ralene CorkSikora.  He said you ligaments are still week and can be easily re-injured.  "Okay, boy do I know that!  I took the brace off for a day and boy could I tell a difference.  Thanks for calling me back."

## 2014-12-24 ENCOUNTER — Ambulatory Visit (INDEPENDENT_AMBULATORY_CARE_PROVIDER_SITE_OTHER): Payer: Managed Care, Other (non HMO)

## 2014-12-24 ENCOUNTER — Ambulatory Visit: Payer: PRIVATE HEALTH INSURANCE

## 2014-12-24 DIAGNOSIS — M722 Plantar fascial fibromatosis: Secondary | ICD-10-CM | POA: Diagnosis not present

## 2014-12-24 DIAGNOSIS — S93401A Sprain of unspecified ligament of right ankle, initial encounter: Secondary | ICD-10-CM

## 2014-12-24 DIAGNOSIS — M25371 Other instability, right ankle: Secondary | ICD-10-CM | POA: Diagnosis not present

## 2014-12-24 DIAGNOSIS — M158 Other polyosteoarthritis: Secondary | ICD-10-CM

## 2014-12-24 DIAGNOSIS — R52 Pain, unspecified: Secondary | ICD-10-CM

## 2014-12-24 NOTE — Progress Notes (Signed)
   Subjective:    Patient ID: Rebecca Lewis, female    DOB: 02-21-1946, 69 y.o.   MRN: [id number]  HPI  ''RT FOOT IS DOING A LITTLE BETTER BUT STILL PAINFUL WHEN WALKING.''  Review of Systems no new findings or systemic changes noted     Objective:   Physical Exam neurovascular status is intact pedal pulses are palpable continues to be swelling lateral ankle medial ankle is unremarkable however lateral ankle swelling posterior and anterior to the malleoli are area in particular along the sinus tarsi region. X-rays reveal no fractures no cysts tumors generalized mild osteopenic changes are noted no displacements in the ankle mortise her tib-fib syndesmosis. Tenderness on direct palpation and plantar flexion and inversion.      Assessment & Plan:  Assessment persistent ankle instability patient's had a history of ankle sprains lateral right ankle sprains in particular most recent with the past 6 weeks. Is improved with the ankle stabilizer however we'll continue with stabilization cane use a high top hiking boot or shoe rather than the stabilizer when available. Also continue with an NSAID or meloxicam on an as-needed basis for flareup. Follow-up in a month or 2 if not resolved or improved may be candidate for MRI reevaluation. Next  Alvan Dameichard Jaylinn Hellenbrand DPM

## 2014-12-24 NOTE — Patient Instructions (Signed)
ICE INSTRUCTIONS  Apply ice or cold pack to the affected area at least 3 times a day for 10-15 minutes each time.  You should also use ice after prolonged activity or vigorous exercise.  Do not apply ice longer than 20 minutes at one time.  Always keep a cloth between your skin and the ice pack to prevent burns.  Being consistent and following these instructions will help control your symptoms.  We suggest you purchase a gel ice pack because they are reusable and do bit leak.  Some of them are designed to wrap around the area.  Use the method that works best for you.  Here are some other suggestions for icing.   Use a frozen bag of peas or corn-inexpensive and molds well to your body, usually stays frozen for 10 to 20 minutes.  Wet a towel with cold water and squeeze out the excess until it's damp.  Place in a bag in the freezer for 20 minutes. Then remove and use.  Use meloxicam on an as-needed basis for pain   Maintain ankle stabilizer for at least another 30-60 days as needed Ashleigh weaning off of the stabilizer.  If wearing a hightop hiking shoe may delete stabilizer

## 2015-02-25 ENCOUNTER — Ambulatory Visit: Payer: PRIVATE HEALTH INSURANCE

## 2015-03-03 ENCOUNTER — Ambulatory Visit: Payer: Managed Care, Other (non HMO)

## 2015-04-14 ENCOUNTER — Ambulatory Visit: Payer: Managed Care, Other (non HMO) | Admitting: Podiatry

## 2015-04-27 DIAGNOSIS — S96911D Strain of unspecified muscle and tendon at ankle and foot level, right foot, subsequent encounter: Secondary | ICD-10-CM | POA: Insufficient documentation

## 2015-05-01 HISTORY — PX: OTHER SURGICAL HISTORY: SHX169

## 2015-05-12 ENCOUNTER — Ambulatory Visit (INDEPENDENT_AMBULATORY_CARE_PROVIDER_SITE_OTHER): Payer: 59 | Admitting: Family Medicine

## 2015-05-12 ENCOUNTER — Encounter: Payer: Self-pay | Admitting: Family Medicine

## 2015-05-12 VITALS — BP 120/68 | HR 79 | Temp 98.1°F | Ht 66.5 in | Wt 163.0 lb

## 2015-05-12 DIAGNOSIS — M25512 Pain in left shoulder: Secondary | ICD-10-CM | POA: Diagnosis not present

## 2015-05-12 DIAGNOSIS — M542 Cervicalgia: Secondary | ICD-10-CM | POA: Diagnosis not present

## 2015-05-12 DIAGNOSIS — K219 Gastro-esophageal reflux disease without esophagitis: Secondary | ICD-10-CM | POA: Diagnosis not present

## 2015-05-12 DIAGNOSIS — Z8619 Personal history of other infectious and parasitic diseases: Secondary | ICD-10-CM

## 2015-05-12 DIAGNOSIS — M25579 Pain in unspecified ankle and joints of unspecified foot: Secondary | ICD-10-CM

## 2015-05-12 DIAGNOSIS — M2660 Temporomandibular joint disorder, unspecified: Secondary | ICD-10-CM

## 2015-05-12 DIAGNOSIS — M26609 Unspecified temporomandibular joint disorder, unspecified side: Secondary | ICD-10-CM

## 2015-05-12 DIAGNOSIS — M25519 Pain in unspecified shoulder: Secondary | ICD-10-CM | POA: Insufficient documentation

## 2015-05-12 DIAGNOSIS — Z1211 Encounter for screening for malignant neoplasm of colon: Secondary | ICD-10-CM

## 2015-05-12 DIAGNOSIS — E785 Hyperlipidemia, unspecified: Secondary | ICD-10-CM

## 2015-05-12 DIAGNOSIS — N200 Calculus of kidney: Secondary | ICD-10-CM | POA: Insufficient documentation

## 2015-05-12 DIAGNOSIS — R739 Hyperglycemia, unspecified: Secondary | ICD-10-CM

## 2015-05-12 DIAGNOSIS — M1990 Unspecified osteoarthritis, unspecified site: Secondary | ICD-10-CM

## 2015-05-12 HISTORY — DX: Hyperglycemia, unspecified: R73.9

## 2015-05-12 HISTORY — DX: Pain in unspecified shoulder: M25.519

## 2015-05-12 HISTORY — DX: Cervicalgia: M54.2

## 2015-05-12 NOTE — Progress Notes (Signed)
Pre visit review using our clinic review tool, if applicable. No additional management support is needed unless otherwise documented below in the visit note. 

## 2015-05-12 NOTE — Assessment & Plan Note (Signed)
Improving slowly after surgery

## 2015-05-12 NOTE — Patient Instructions (Signed)
Cologuard to screen for colon cancer   Preventive Care for Adults A healthy lifestyle and preventive care can promote health and wellness. Preventive health guidelines for women include the following key practices.  A routine yearly physical is a good way to check with your health care provider about your health and preventive screening. It is a chance to share any concerns and updates on your health and to receive a thorough exam.  Visit your dentist for a routine exam and preventive care every 6 months. Brush your teeth twice a day and floss once a day. Good oral hygiene prevents tooth decay and gum disease.  The frequency of eye exams is based on your age, health, family medical history, use of contact lenses, and other factors. Follow your health care provider's recommendations for frequency of eye exams.  Eat a healthy diet. Foods like vegetables, fruits, whole grains, low-fat dairy products, and lean protein foods contain the nutrients you need without too many calories. Decrease your intake of foods high in solid fats, added sugars, and salt. Eat the right amount of calories for you.Get information abo  ut a proper diet from your health care provider, if necessary.  Regular physical exercise is one of the most important things you can do for your health. Most adults should get at least 150 minutes of moderate-intensity exercise (any activity that increases your heart rate and causes you to sweat) each week. In addition, most adults need muscle-strengthening exercises on 2 or more days a week.  Maintain a healthy weight. The body mass index (BMI) is a screening tool to identify possible weight problems. It provides an estimate of body fat based on height and weight. Your health care provider can find your BMI and can help you achieve or maintain a healthy weight.For adults 20 years and older:  A BMI below 18.5 is considered underweight.  A BMI of 18.5 to 24.9 is normal.  A BMI of 25  to 29.9 is considered overweight.  A BMI of 30 and above is considered obese.  Maintain normal blood lipids and cholesterol levels by exercising and minimizing your intake of saturated fat. Eat a balanced diet with plenty of fruit and vegetables. Blood tests for lipids and cholesterol should begin at age 53 and be repeated every 5 years. If your lipid or cholesterol levels are high, you are over 50, or you are at high risk for heart disease, you may need your cholesterol levels checked more frequently.Ongoing high lipid and cholesterol levels should be treated with medicines if diet and exercise are not working.  If you smoke, find out from your health care provider how to quit. If you do not use tobacco, do not start.  Lung cancer screening is recommended for adults aged 81-80 years who are at high risk for developing lung cancer because of a history of smoking. A yearly low-dose CT scan of the lungs is recommended for people who have at least a 30-pack-year history of smoking and are a current smoker or have quit within the past 15 years. A pack year of smoking is smoking an average of 1 pack of cigarettes a day for 1 year (for example: 1 pack a day for 30 years or 2 packs a day for 15 years). Yearly screening should continue until the smoker has stopped smoking for at least 15 years. Yearly screening should be stopped for people who develop a health problem that would prevent them from having lung cancer treatment.  If you  are pregnant, do not drink alcohol. If you are breastfeeding, be very cautious about drinking alcohol. If you are not pregnant and choose to drink alcohol, do not have more than 1 drink per day. One drink is considered to be 12 ounces (355 mL) of beer, 5 ounces (148 mL) of wine, or 1.5 ounces (44 mL) of liquor.  Avoid use of street drugs. Do not share needles with anyone. Ask for help if you need support or instructions about stopping the use of drugs.  High blood pressure causes  heart disease and increases the risk of stroke. Your blood pressure should be checked at least every 1 to 2 years. Ongoing high blood pressure should be treated with medicines if weight loss and exercise do not work.  If you are 39-45 years old, ask your health care provider if you should take aspirin to prevent strokes.  Diabetes screening involves taking a blood sample to check your fasting blood sugar level. This should be done once every 3 years, after age 44, if you are within normal weight and without risk factors for diabetes. Testing should be considered at a younger age or be carried out more frequently if you are overweight and have at least 1 risk factor for diabetes.  Breast cancer screening is essential preventive care for women. You should practice "breast self-awareness." This means understanding the normal appearance and feel of your breasts and may include breast self-examination. Any changes detected, no matter how small, should be reported to a health care provider. Women in their 23s and 30s should have a clinical breast exam (CBE) by a health care provider as part of a regular health exam every 1 to 3 years. After age 72, women should have a CBE every year. Starting at age 67, women should consider having a mammogram (breast X-ray test) every year. Women who have a family history of breast cancer should talk to their health care provider about genetic screening. Women at a high risk of breast cancer should talk to their health care providers about having an MRI and a mammogram every year.  Breast cancer gene (BRCA)-related cancer risk assessment is recommended for women who have family members with BRCA-related cancers. BRCA-related cancers include breast, ovarian, tubal, and peritoneal cancers. Having family members with these cancers may be associated with an increased risk for harmful changes (mutations) in the breast cancer genes BRCA1 and BRCA2. Results of the assessment will  determine the need for genetic counseling and BRCA1 and BRCA2 testing.  Routine pelvic exams to screen for cancer are no longer recommended for nonpregnant women who are considered low risk for cancer of the pelvic organs (ovaries, uterus, and vagina) and who do not have symptoms. Ask your health care provider if a screening pelvic exam is right for you.  If you have had past treatment for cervical cancer or a condition that could lead to cancer, you need Pap tests and screening for cancer for at least 20 years after your treatment. If Pap tests have been discontinued, your risk factors (such as having a new sexual partner) need to be reassessed to determine if screening should be resumed. Some women have medical problems that increase the chance of getting cervical cancer. In these cases, your health care provider may recommend more frequent screening and Pap tests.  The HPV test is an additional test that may be used for cervical cancer screening. The HPV test looks for the virus that can cause the cell changes on the  cervix. The cells collected during the Pap test can be tested for HPV. The HPV test could be used to screen women aged 68 years and older, and should be used in women of any age who have unclear Pap test results. After the age of 26, women should have HPV testing at the same frequency as a Pap test.  Colorectal cancer can be detected and often prevented. Most routine colorectal cancer screening begins at the age of 21 years and continues through age 19 years. However, your health care provider may recommend screening at an earlier age if you have risk factors for colon cancer. On a yearly basis, your health care provider may provide home test kits to check for hidden blood in the stool. Use of a small camera at the end of a tube, to directly examine the colon (sigmoidoscopy or colonoscopy), can detect the earliest forms of colorectal cancer. Talk to your health care provider about this at age  39, when routine screening begins. Direct exam of the colon should be repeated every 5-10 years through age 8 years, unless early forms of pre-cancerous polyps or small growths are found.  People who are at an increased risk for hepatitis B should be screened for this virus. You are considered at high risk for hepatitis B if:  You were born in a country where hepatitis B occurs often. Talk with your health care provider about which countries are considered high risk.  Your parents were born in a high-risk country and you have not received a shot to protect against hepatitis B (hepatitis B vaccine).  You have HIV or AIDS.  You use needles to inject street drugs.  You live with, or have sex with, someone who has hepatitis B.  You get hemodialysis treatment.  You take certain medicines for conditions like cancer, organ transplantation, and autoimmune conditions.  Hepatitis C blood testing is recommended for all people born from [birth year] through 1965 and any individual with known risks for hepatitis C.  Practice safe sex. Use condoms and avoid high-risk sexual practices to reduce the spread of sexually transmitted infections (STIs). STIs include gonorrhea, chlamydia, syphilis, trichomonas, herpes, HPV, and human immunodeficiency virus (HIV). Herpes, HIV, and HPV are viral illnesses that have no cure. They can result in disability, cancer, and death.  You should be screened for sexually transmitted illnesses (STIs) including gonorrhea and chlamydia if:  You are sexually active and are younger than 24 years.  You are older than 24 years and your health care provider tells you that you are at risk for this type of infection.  Your sexual activity has changed since you were last screened and you are at an increased risk for chlamydia or gonorrhea. Ask your health care provider if you are at risk.  If you are at risk of being infected with HIV, it is recommended that you take a prescription  medicine daily to prevent HIV infection. This is called preexposure prophylaxis (PrEP). You are considered at risk if:  You are a heterosexual woman, are sexually active, and are at increased risk for HIV infection.  You take drugs by injection.  You are sexually active with a partner who has HIV.  Talk with your health care provider about whether you are at high risk of being infected with HIV. If you choose to begin PrEP, you should first be tested for HIV. You should then be tested every 3 months for as long as you are taking PrEP.  Osteoporosis is  a disease in which the bones lose minerals and strength with aging. This can result in serious bone fractures or breaks. The risk of osteoporosis can be identified using a bone density scan. Women ages 65 years and over and women at risk for fractures or osteoporosis should discuss screening with their health care providers. Ask your health care provider whether you should take a calcium supplement or vitamin D to reduce the rate of osteoporosis.  Menopause can be associated with physical symptoms and risks. Hormone replacement therapy is available to decrease symptoms and risks. You should talk to your health care provider about whether hormone replacement therapy is right for you.  Use sunscreen. Apply sunscreen liberally and repeatedly throughout the day. You should seek shade when your shadow is shorter than you. Protect yourself by wearing long sleeves, pants, a wide-brimmed hat, and sunglasses year round, whenever you are outdoors.  Once a month, do a whole body skin exam, using a mirror to look at the skin on your back. Tell your health care provider of new moles, moles that have irregular borders, moles that are larger than a pencil eraser, or moles that have changed in shape or color.  Stay current with required vaccines (immunizations).  Influenza vaccine. All adults should be immunized every year.  Tetanus, diphtheria, and acellular  pertussis (Td, Tdap) vaccine. Pregnant women should receive 1 dose of Tdap vaccine during each pregnancy. The dose should be obtained regardless of the length of time since the last dose. Immunization is preferred during the 27th-36th week of gestation. An adult who has not previously received Tdap or who does not know her vaccine status should receive 1 dose of Tdap. This initial dose should be followed by tetanus and diphtheria toxoids (Td) booster doses every 10 years. Adults with an unknown or incomplete history of completing a 3-dose immunization series with Td-containing vaccines should begin or complete a primary immunization series including a Tdap dose. Adults should receive a Td booster every 10 years.  Varicella vaccine. An adult without evidence of immunity to varicella should receive 2 doses or a second dose if she has previously received 1 dose. Pregnant females who do not have evidence of immunity should receive the first dose after pregnancy. This first dose should be obtained before leaving the health care facility. The second dose should be obtained 4-8 weeks after the first dose.  Human papillomavirus (HPV) vaccine. Females aged 13-26 years who have not received the vaccine previously should obtain the 3-dose series. The vaccine is not recommended for use in pregnant females. However, pregnancy testing is not needed before receiving a dose. If a female is found to be pregnant after receiving a dose, no treatment is needed. In that case, the remaining doses should be delayed until after the pregnancy. Immunization is recommended for any person with an immunocompromised condition through the age of 26 years if she did not get any or all doses earlier. During the 3-dose series, the second dose should be obtained 4-8 weeks after the first dose. The third dose should be obtained 24 weeks after the first dose and 16 weeks after the second dose.  Zoster vaccine. One dose is recommended for adults  aged 60 years or older unless certain conditions are present.  Measles, mumps, and rubella (MMR) vaccine. Adults born before 1957 generally are considered immune to measles and mumps. Adults born in 1957 or later should have 1 or more doses of MMR vaccine unless there is a contraindication to   the vaccine or there is laboratory evidence of immunity to each of the three diseases. A routine second dose of MMR vaccine should be obtained at least 28 days after the first dose for students attending postsecondary schools, health care workers, or international travelers. People who received inactivated measles vaccine or an unknown type of measles vaccine during 1963-1967 should receive 2 doses of MMR vaccine. People who received inactivated mumps vaccine or an unknown type of mumps vaccine before 1979 and are at high risk for mumps infection should consider immunization with 2 doses of MMR vaccine. For females of childbearing age, rubella immunity should be determined. If there is no evidence of immunity, females who are not pregnant should be vaccinated. If there is no evidence of immunity, females who are pregnant should delay immunization until after pregnancy. Unvaccinated health care workers born before 1957 who lack laboratory evidence of measles, mumps, or rubella immunity or laboratory confirmation of disease should consider measles and mumps immunization with 2 doses of MMR vaccine or rubella immunization with 1 dose of MMR vaccine.  Pneumococcal 13-valent conjugate (PCV13) vaccine. When indicated, a person who is uncertain of her immunization history and has no record of immunization should receive the PCV13 vaccine. An adult aged 19 years or older who has certain medical conditions and has not been previously immunized should receive 1 dose of PCV13 vaccine. This PCV13 should be followed with a dose of pneumococcal polysaccharide (PPSV23) vaccine. The PPSV23 vaccine dose should be obtained at least 8 weeks  after the dose of PCV13 vaccine. An adult aged 19 years or older who has certain medical conditions and previously received 1 or more doses of PPSV23 vaccine should receive 1 dose of PCV13. The PCV13 vaccine dose should be obtained 1 or more years after the last PPSV23 vaccine dose.  Pneumococcal polysaccharide (PPSV23) vaccine. When PCV13 is also indicated, PCV13 should be obtained first. All adults aged 65 years and older should be immunized. An adult younger than age 65 years who has certain medical conditions should be immunized. Any person who resides in a nursing home or long-term care facility should be immunized. An adult smoker should be immunized. People with an immunocompromised condition and certain other conditions should receive both PCV13 and PPSV23 vaccines. People with human immunodeficiency virus (HIV) infection should be immunized as soon as possible after diagnosis. Immunization during chemotherapy or radiation therapy should be avoided. Routine use of PPSV23 vaccine is not recommended for American Indians, [place] Natives, or people younger than 65 years unless there are medical conditions that require PPSV23 vaccine. When indicated, people who have unknown immunization and have no record of immunization should receive PPSV23 vaccine. One-time revaccination 5 years after the first dose of PPSV23 is recommended for people aged 19-64 years who have chronic kidney failure, nephrotic syndrome, asplenia, or immunocompromised conditions. People who received 1-2 doses of PPSV23 before age 65 years should receive another dose of PPSV23 vaccine at age 65 years or later if at least 5 years have passed since the previous dose. Doses of PPSV23 are not needed for people immunized with PPSV23 at or after age 65 years.  Meningococcal vaccine. Adults with asplenia or persistent complement component deficiencies should receive 2 doses of quadrivalent meningococcal conjugate (MenACWY-D) vaccine. The doses  should be obtained at least 2 months apart. Microbiologists working with certain meningococcal bacteria, military recruits, people at risk during an outbreak, and people who travel to or live in countries with a high rate of meningitis   should be immunized. A first-year college student up through age 21 years who is living in a residence hall should receive a dose if she did not receive a dose on or after her 16th birthday. Adults who have certain high-risk conditions should receive one or more doses of vaccine.  Hepatitis A vaccine. Adults who wish to be protected from this disease, have certain high-risk conditions, work with hepatitis A-infected animals, work in hepatitis A research labs, or travel to or work in countries with a high rate of hepatitis A should be immunized. Adults who were previously unvaccinated and who anticipate close contact with an international adoptee during the first 60 days after arrival in the [place] from a country with a high rate of hepatitis A should be immunized.  Hepatitis B vaccine. Adults who wish to be protected from this disease, have certain high-risk conditions, may be exposed to blood or other infectious body fluids, are household contacts or sex partners of hepatitis B positive people, are clients or workers in certain care facilities, or travel to or work in countries with a high rate of hepatitis B should be immunized.  Haemophilus influenzae type b (Hib) vaccine. A previously unvaccinated person with asplenia or sickle cell disease or having a scheduled splenectomy should receive 1 dose of Hib vaccine. Regardless of previous immunization, a recipient of a hematopoietic stem cell transplant should receive a 3-dose series 6-12 months after her successful transplant. Hib vaccine is not recommended for adults with HIV infection. Preventive Services / Frequency Ages 19 to 39 years  Blood pressure check.** / Every 1 to 2 years.  Lipid and cholesterol check.**  / Every 5 years beginning at age 20.  Clinical breast exam.** / Every 3 years for women in their 20s and 30s.  BRCA-related cancer risk assessment.** / For women who have family members with a BRCA-related cancer (breast, ovarian, tubal, or peritoneal cancers).  Pap test.** / Every 2 years from ages 21 through 29. Every 3 years starting at age 30 through age 65 or 70 with a history of 3 consecutive normal Pap tests.  HPV screening.** / Every 3 years from ages 30 through ages 65 to 70 with a history of 3 consecutive normal Pap tests.  Hepatitis C blood test.** / For any individual with known risks for hepatitis C.  Skin self-exam. / Monthly.  Influenza vaccine. / Every year.  Tetanus, diphtheria, and acellular pertussis (Tdap, Td) vaccine.** / Consult your health care provider. Pregnant women should receive 1 dose of Tdap vaccine during each pregnancy. 1 dose of Td every 10 years.  Varicella vaccine.** / Consult your health care provider. Pregnant females who do not have evidence of immunity should receive the first dose after pregnancy.  HPV vaccine. / 3 doses over 6 months, if 26 and younger. The vaccine is not recommended for use in pregnant females. However, pregnancy testing is not needed before receiving a dose.  Measles, mumps, rubella (MMR) vaccine.** / You need at least 1 dose of MMR if you were born in 1957 or later. You may also need a 2nd dose. For females of childbearing age, rubella immunity should be determined. If there is no evidence of immunity, females who are not pregnant should be vaccinated. If there is no evidence of immunity, females who are pregnant should delay immunization until after pregnancy.  Pneumococcal 13-valent conjugate (PCV13) vaccine.** / Consult your health care provider.  Pneumococcal polysaccharide (PPSV23) vaccine.** / 1 to 2 doses if you smoke   cigarettes or if you have certain conditions.  Meningococcal vaccine.** / 1 dose if you are age 19 to 21  years and a first-year college student living in a residence hall, or have one of several medical conditions, you need to get vaccinated against meningococcal disease. You may also need additional booster doses.  Hepatitis A vaccine.** / Consult your health care provider.  Hepatitis B vaccine.** / Consult your health care provider.  Haemophilus influenzae type b (Hib) vaccine.** / Consult your health care provider. Ages 40 to 64 years  Blood pressure check.** / Every 1 to 2 years.  Lipid and cholesterol check.** / Every 5 years beginning at age 20 years.  Lung cancer screening. / Every year if you are aged 55-80 years and have a 30-pack-year history of smoking and currently smoke or have quit within the past 15 years. Yearly screening is stopped once you have quit smoking for at least 15 years or develop a health problem that would prevent you from having lung cancer treatment.  Clinical breast exam.** / Every year after age 40 years.  BRCA-related cancer risk assessment.** / For women who have family members with a BRCA-related cancer (breast, ovarian, tubal, or peritoneal cancers).  Mammogram.** / Every year beginning at age 40 years and continuing for as long as you are in good health. Consult with your health care provider.  Pap test.** / Every 3 years starting at age 30 years through age 65 or 70 years with a history of 3 consecutive normal Pap tests.  HPV screening.** / Every 3 years from ages 30 years through ages 65 to 70 years with a history of 3 consecutive normal Pap tests.  Fecal occult blood test (FOBT) of stool. / Every year beginning at age 50 years and continuing until age 75 years. You may not need to do this test if you get a colonoscopy every 10 years.  Flexible sigmoidoscopy or colonoscopy.** / Every 5 years for a flexible sigmoidoscopy or every 10 years for a colonoscopy beginning at age 50 years and continuing until age 75 years.  Hepatitis C blood test.** / For  all people born from 1945 through 1965 and any individual with known risks for hepatitis C.  Skin self-exam. / Monthly.  Influenza vaccine. / Every year.  Tetanus, diphtheria, and acellular pertussis (Tdap/Td) vaccine.** / Consult your health care provider. Pregnant women should receive 1 dose of Tdap vaccine during each pregnancy. 1 dose of Td every 10 years.  Varicella vaccine.** / Consult your health care provider. Pregnant females who do not have evidence of immunity should receive the first dose after pregnancy.  Zoster vaccine.** / 1 dose for adults aged 60 years or older.  Measles, mumps, rubella (MMR) vaccine.** / You need at least 1 dose of MMR if you were born in 1957 or later. You may also need a 2nd dose. For females of childbearing age, rubella immunity should be determined. If there is no evidence of immunity, females who are not pregnant should be vaccinated. If there is no evidence of immunity, females who are pregnant should delay immunization until after pregnancy.  Pneumococcal 13-valent conjugate (PCV13) vaccine.** / Consult your health care provider.  Pneumococcal polysaccharide (PPSV23) vaccine.** / 1 to 2 doses if you smoke cigarettes or if you have certain conditions.  Meningococcal vaccine.** / Consult your health care provider.  Hepatitis A vaccine.** / Consult your health care provider.  Hepatitis B vaccine.** / Consult your health care provider.  Haemophilus influenzae type   b (Hib) vaccine.** / Consult your health care provider. Ages 65 years and over  Blood pressure check.** / Every 1 to 2 years.  Lipid and cholesterol check.** / Every 5 years beginning at age 20 years.  Lung cancer screening. / Every year if you are aged 55-80 years and have a 30-pack-year history of smoking and currently smoke or have quit within the past 15 years. Yearly screening is stopped once you have quit smoking for at least 15 years or develop a health problem that would prevent  you from having lung cancer treatment.  Clinical breast exam.** / Every year after age 40 years.  BRCA-related cancer risk assessment.** / For women who have family members with a BRCA-related cancer (breast, ovarian, tubal, or peritoneal cancers).  Mammogram.** / Every year beginning at age 40 years and continuing for as long as you are in good health. Consult with your health care provider.  Pap test.** / Every 3 years starting at age 30 years through age 65 or 70 years with 3 consecutive normal Pap tests. Testing can be stopped between 65 and 70 years with 3 consecutive normal Pap tests and no abnormal Pap or HPV tests in the past 10 years.  HPV screening.** / Every 3 years from ages 30 years through ages 65 or 70 years with a history of 3 consecutive normal Pap tests. Testing can be stopped between 65 and 70 years with 3 consecutive normal Pap tests and no abnormal Pap or HPV tests in the past 10 years.  Fecal occult blood test (FOBT) of stool. / Every year beginning at age 50 years and continuing until age 75 years. You may not need to do this test if you get a colonoscopy every 10 years.  Flexible sigmoidoscopy or colonoscopy.** / Every 5 years for a flexible sigmoidoscopy or every 10 years for a colonoscopy beginning at age 50 years and continuing until age 75 years.  Hepatitis C blood test.** / For all people born from 1945 through 1965 and any individual with known risks for hepatitis C.  Osteoporosis screening.** / A one-time screening for women ages 65 years and over and women at risk for fractures or osteoporosis.  Skin self-exam. / Monthly.  Influenza vaccine. / Every year.  Tetanus, diphtheria, and acellular pertussis (Tdap/Td) vaccine.** / 1 dose of Td every 10 years.  Varicella vaccine.** / Consult your health care provider.  Zoster vaccine.** / 1 dose for adults aged 60 years or older.  Pneumococcal 13-valent conjugate (PCV13) vaccine.** / Consult your health care  provider.  Pneumococcal polysaccharide (PPSV23) vaccine.** / 1 dose for all adults aged 65 years and older.  Meningococcal vaccine.** / Consult your health care provider.  Hepatitis A vaccine.** / Consult your health care provider.  Hepatitis B vaccine.** / Consult your health care provider.  Haemophilus influenzae type b (Hib) vaccine.** / Consult your health care provider. ** Family history and personal history of risk and conditions may change your health care provider's recommendations. Document Released: 01/11/2002 Document Revised: 04/01/2014 Document Reviewed: 04/12/2011 [employer] Patient Information 2015 [employer]. This information is not intended to replace advice given to you by your health care provider. Make sure you discuss any questions you have with your health care provider.  

## 2015-05-12 NOTE — Assessment & Plan Note (Signed)
Avoid offending foods, start probiotics. Do not eat large meals in late evening and consider raising head of bed.  

## 2015-05-13 ENCOUNTER — Encounter: Payer: Self-pay | Admitting: Family Medicine

## 2015-05-18 ENCOUNTER — Encounter: Payer: Self-pay | Admitting: Family Medicine

## 2015-05-18 DIAGNOSIS — Z1211 Encounter for screening for malignant neoplasm of colon: Secondary | ICD-10-CM

## 2015-05-18 DIAGNOSIS — Z8619 Personal history of other infectious and parasitic diseases: Secondary | ICD-10-CM | POA: Insufficient documentation

## 2015-05-18 DIAGNOSIS — M26609 Unspecified temporomandibular joint disorder, unspecified side: Secondary | ICD-10-CM

## 2015-05-18 HISTORY — DX: Unspecified temporomandibular joint disorder, unspecified side: M26.609

## 2015-05-18 HISTORY — DX: Personal history of other infectious and parasitic diseases: Z86.19

## 2015-05-18 HISTORY — DX: Encounter for screening for malignant neoplasm of colon: Z12.11

## 2015-05-18 NOTE — Progress Notes (Signed)
Rebecca Lewis  [id number] 1946/05/30 05/18/2015      Progress Note-Follow Up  Subjective  Chief Complaint  Chief Complaint  Patient presents with  . Establish Care    HPI  Patient is a 69 y.o. female in today for routine medical care. Patient is in today to establish care and has, in the past medical history that includes hyperlipidemia, reflux, TMJ, chickenpox, arthritis, hyperglycemia. She has chronic ongoing complaints that include numerous sources of pain in neck, shoulders, jaw and more. No acute illness or complaint today. Denies CP/palp/SOB/HA/congestion/fevers/GI or GU c/o. Taking meds as prescribed  Past Medical History  Diagnosis Date  . UTI (lower urinary tract infection)   . Arthritis   . Hyperlipidemia   . Kidney stones   . GERD (gastroesophageal reflux disease)   . Pain in joint, shoulder region 05/12/2015    right  . Neck pain 05/12/2015  . Hyperglycemia 05/12/2015  . History of chicken pox 05/18/2015  . H/O mumps   . H/O measles   . Screen for colon cancer 05/18/2015    Past Surgical History  Procedure Laterality Date  . Appendectomy  1966  . Epicondylitis  08/28/97    Left arm, tennis elbow release  . Carpal tunnel release  09/15/04  . Ulnar shortening with bone graft  08/28/02  . Endoscopic plantar fasciotomy Right 06/21/05  . Neck fusion  6/14.07    C5, C6, C7  . Right ulna shortening Right 11/02/07  . Extracorporeal shock wave lithotripsy Right 02/27/09  . Cataract extraction Right 03/03/09  . Hammer toes Right 03/03/09    right foot 2 hammer toes corrected  . Cataract extraction Left 03/17/09  . Septoplasty  03/11/10  . Shoulder surgery Right 12/09/10  . Tmj arthroplasty Left 03/27/13  . Lithotripsy Right 04/09/14  . Arthrotomy and menisectomy Right 05/01/15    Family History  Problem Relation Age of Onset  . Cancer Mother     lymphoma, melanoma  . Heart disease Father 61    MI, arteriothrombosis  . Cancer Sister     lung?  . Heart disease Brother   .  Heart disease Son   . Heart disease Paternal Uncle   . Alcohol abuse Maternal Grandfather   . Heart disease Maternal Grandfather   . Dementia Paternal Grandmother   . Dementia Paternal Grandfather     History   Social History  . Marital Status: Divorced    Spouse Name: N/A  . Number of Children: N/A  . Years of Education: N/A   Occupational History  . retired    Social History Main Topics  . Smoking status: Former Games developer  . Smokeless tobacco: Not on file     Comment: quit in 1997  . Alcohol Use: No  . Drug Use: No  . Sexual Activity: Yes    Birth Control/ Protection: None     Comment: lives alon, no dietary restrictions   Other Topics Concern  . Not on file   Social History Narrative    Current Outpatient Prescriptions on File Prior to Visit  Medication Sig Dispense Refill  . FA-Pyridoxine-Cyancobalamin (FOLTX PO) Take by mouth.    . rosuvastatin (CRESTOR) 5 MG tablet Take 5 mg by mouth daily.    Marland Kitchen esomeprazole (NEXIUM) 40 MG capsule Take 40 mg by mouth daily at 12 noon.     No current facility-administered medications on file prior to visit.    Allergies  Allergen Reactions  . Poison Ivy Extract [employer]  Poison Ivy]   . Vicodin [Hydrocodone-Acetaminophen]     Review of Systems  Review of Systems  Constitutional: Negative for fever, chills and malaise/fatigue.  HENT: Negative for congestion, hearing loss and nosebleeds.   Eyes: Negative for discharge.  Respiratory: Negative for cough, sputum production, shortness of breath and wheezing.   Cardiovascular: Negative for chest pain, palpitations and leg swelling.  Gastrointestinal: Negative for heartburn, nausea, vomiting, abdominal pain, diarrhea, constipation and blood in stool.  Genitourinary: Negative for dysuria, urgency, frequency and hematuria.  Musculoskeletal: Positive for back pain and joint pain. Negative for myalgias and falls.  Skin: Negative for rash.  Neurological: Negative for dizziness,  tremors, sensory change, focal weakness, loss of consciousness, weakness and headaches.  Endo/Heme/Allergies: Negative for polydipsia. Does not bruise/bleed easily.  Psychiatric/Behavioral: Negative for depression and suicidal ideas. The patient is not nervous/anxious and does not have insomnia.     Objective  BP 120/68 mmHg  Pulse 79  Temp(Src) 98.1 F (36.7 C) (Oral)  Ht 5' 6.5" (1.689 m)  Wt 163 lb (73.936 kg)  BMI 25.92 kg/m2  SpO2 95%  Physical Exam  Physical Exam  Constitutional: She is oriented to person, place, and time and well-developed, well-nourished, and in no distress. No distress.  HENT:  Head: Normocephalic and atraumatic.  Right Ear: External ear normal.  Left Ear: External ear normal.  Nose: Nose normal.  Mouth/Throat: Oropharynx is clear and moist. No oropharyngeal exudate.  Eyes: Conjunctivae are normal. Pupils are equal, round, and reactive to light. Right eye exhibits no discharge. Left eye exhibits no discharge. No scleral icterus.  Neck: Normal range of motion. Neck supple. No thyromegaly present.  Cardiovascular: Normal rate, regular rhythm, normal heart sounds and intact distal pulses.   No murmur heard. Pulmonary/Chest: Effort normal and breath sounds normal. No respiratory distress. She has no wheezes. She has no rales.  Abdominal: Soft. Bowel sounds are normal. She exhibits no distension and no mass. There is no tenderness.  Musculoskeletal: Normal range of motion. She exhibits no edema or tenderness.  Lymphadenopathy:    She has no cervical adenopathy.  Neurological: She is alert and oriented to person, place, and time. She has normal reflexes. No cranial nerve deficit. Coordination normal.  Skin: Skin is warm and dry. No rash noted. She is not diaphoretic.  Psychiatric: Mood, memory and affect normal.       Assessment & Plan  GERD (gastroesophageal reflux disease) Avoid offending foods, start probiotics. Do not eat large meals in late  evening and consider raising head of bed.   Arthritis Improving slowly after surgery  Hyperlipidemia Tolerating statin, encouraged heart healthy diet, avoid trans fats, minimize simple carbs and saturated fats. Increase exercise as tolerated  Hyperglycemia  minimize simple carbs. Increase exercise as tolerated.   Screen for colon cancer Declined referral for colonoscopy but agrees to Cologuard.  Patient declines MGM and paps  TMJ (temporomandibular joint syndrome) Has been following with Neurology for chronic pain

## 2015-05-18 NOTE — Assessment & Plan Note (Signed)
minimize simple carbs. Increase exercise as tolerated.  

## 2015-05-18 NOTE — Assessment & Plan Note (Addendum)
Declined referral for colonoscopy but agrees to Cologuard.  Patient declines MGM and paps

## 2015-05-18 NOTE — Assessment & Plan Note (Signed)
Tolerating statin, encouraged heart healthy diet, avoid trans fats, minimize simple carbs and saturated fats. Increase exercise as tolerated 

## 2015-05-18 NOTE — Assessment & Plan Note (Signed)
Has been following with Neurology for chronic pain

## 2015-07-08 ENCOUNTER — Telehealth: Payer: Self-pay | Admitting: *Deleted

## 2015-07-10 NOTE — Telephone Encounter (Signed)
"  I'm calling to see who is taking over Dr. Dionne Bucy patients.  I'm sure going to miss Dr. Ralene Cork."  Dr. Ardelle Anton and Dr. Stacie Acres are taking Dr. Dionne Bucy patients.  I would probably suggest Dr.Wagoner for you, Dr. Stacie Acres handles mostly debriding of toenails.  "Will I need to contact the department of labor to tell them about doctor change or is that something you all will do?"  You will have to contact them.  "Okay, thank you so much Elfida Shimada."

## 2015-08-05 LAB — CBC AND DIFFERENTIAL
HCT: 40 % (ref 36–46)
Hemoglobin: 13.7 g/dL (ref 12.0–16.0)
Neutrophils Absolute: 56 /uL
PLATELETS: 208 10*3/uL (ref 150–399)
WBC: 4.6 10^3/mL

## 2015-08-05 LAB — BASIC METABOLIC PANEL
BUN: 12 mg/dL (ref 4–21)
CREATININE: 0.7 mg/dL (ref <=1.1)
Glucose: 101 mg/dL
POTASSIUM: 4.8 mmol/L (ref 3.4–5.3)
Sodium: 147 mmol/L (ref 137–147)

## 2015-08-05 LAB — LIPID PANEL
Cholesterol: 210 mg/dL — AB (ref 0–200)
HDL: 78 mg/dL — AB (ref 35–70)
LDL Cholesterol: 103 mg/dL
TRIGLYCERIDES: 147 mg/dL (ref 40–160)

## 2015-08-05 LAB — HEPATIC FUNCTION PANEL
ALT: 39 U/L — AB (ref 7–35)
AST: 33 U/L (ref 13–35)
Alkaline Phosphatase: 99 U/L (ref 25–125)
BILIRUBIN, TOTAL: 0.5 mg/dL

## 2015-08-05 LAB — TSH: TSH: 1.38 u[IU]/mL (ref <=5.90)

## 2015-08-05 LAB — HEMOGLOBIN A1C: HEMOGLOBIN A1C: 6 % (ref 4.0–6.0)

## 2015-08-08 ENCOUNTER — Encounter: Payer: Self-pay | Admitting: Family Medicine

## 2015-08-12 ENCOUNTER — Ambulatory Visit (INDEPENDENT_AMBULATORY_CARE_PROVIDER_SITE_OTHER): Payer: 59 | Admitting: Family Medicine

## 2015-08-12 ENCOUNTER — Encounter: Payer: Self-pay | Admitting: Family Medicine

## 2015-08-12 VITALS — BP 130/82 | HR 81 | Temp 98.3°F | Ht 66.5 in | Wt 166.4 lb

## 2015-08-12 DIAGNOSIS — E663 Overweight: Secondary | ICD-10-CM

## 2015-08-12 DIAGNOSIS — E785 Hyperlipidemia, unspecified: Secondary | ICD-10-CM | POA: Diagnosis not present

## 2015-08-12 DIAGNOSIS — K219 Gastro-esophageal reflux disease without esophagitis: Secondary | ICD-10-CM

## 2015-08-12 DIAGNOSIS — R739 Hyperglycemia, unspecified: Secondary | ICD-10-CM

## 2015-08-12 DIAGNOSIS — M1990 Unspecified osteoarthritis, unspecified site: Secondary | ICD-10-CM

## 2015-08-12 DIAGNOSIS — R7989 Other specified abnormal findings of blood chemistry: Secondary | ICD-10-CM

## 2015-08-12 DIAGNOSIS — R945 Abnormal results of liver function studies: Secondary | ICD-10-CM

## 2015-08-12 HISTORY — DX: Abnormal results of liver function studies: R94.5

## 2015-08-12 HISTORY — DX: Overweight: E66.3

## 2015-08-12 HISTORY — DX: Other specified abnormal findings of blood chemistry: R79.89

## 2015-08-12 MED ORDER — RANITIDINE HCL 150 MG PO TABS: 150.0000 mg | ORAL_TABLET | Freq: Every day | ORAL | Status: DC | PRN

## 2015-08-12 NOTE — Assessment & Plan Note (Signed)
Encouraged heart healthy diet, increase exercise, avoid trans fats, consider a krill oil cap daily 

## 2015-08-12 NOTE — Patient Instructions (Signed)
Avoid offending foods, start probiotics. Do not eat large meals in late evening and consider raising head of bed.  Consider a probiotic daily such as [employer] or [facility] or online [employer] has a 10 strain probiotic by the [employer] company.  Food Choices for Gastroesophageal Reflux Disease When you have gastroesophageal reflux disease (GERD), the foods you eat and your eating habits are very important. Choosing the right foods can help ease your discomfort.  WHAT GUIDELINES DO I NEED TO FOLLOW?   Choose fruits, vegetables, whole grains, and low-fat dairy products.   Choose low-fat meat, fish, and poultry.  Limit fats such as oils, salad dressings, butter, nuts, and avocado.   Keep a food diary. This helps you identify foods that cause symptoms.   Avoid foods that cause symptoms. These may be different for everyone.   Eat small meals often instead of 3 large meals a day.   Eat your meals slowly, in a place where you are relaxed.   Limit fried foods.   Cook foods using methods other than frying.   Avoid drinking alcohol.   Avoid drinking large amounts of liquids with your meals.   Avoid bending over or lying down until 2-3 hours after eating.  WHAT FOODS ARE NOT RECOMMENDED?  These are some foods and drinks that may make your symptoms worse: Vegetables Tomatoes. Tomato juice. Tomato and spaghetti sauce. Chili peppers. Onion and garlic. Horseradish. Fruits Oranges, grapefruit, and lemon (fruit and juice). Meats High-fat meats, fish, and poultry. This includes hot dogs, ribs, ham, sausage, salami, and bacon. Dairy Whole milk and chocolate milk. Sour cream. Cream. Butter. Ice cream. Cream cheese.  Drinks Coffee and tea. Bubbly (carbonated) drinks or energy drinks. Condiments Hot sauce. Barbecue sauce.  Sweets/Desserts Chocolate and cocoa. Donuts. Peppermint and spearmint. Fats and Oils High-fat foods. This includes [place] fries and  potato chips. Other Vinegar. Strong spices. This includes black pepper, white pepper, red pepper, cayenne, curry powder, cloves, ginger, and chili powder. The items listed above may not be a complete list of foods and drinks to avoid. Contact your dietitian for more information. Document Released: 05/16/2012 Document Revised: 11/20/2013 Document Reviewed: 09/19/2013 [facility] Patient Information 2015 [place], [place]. This information is not intended to replace advice given to you by your health care provider. Make sure you discuss any questions you have with your health care provider.

## 2015-08-12 NOTE — Assessment & Plan Note (Signed)
hgba1c acceptable, minimize simple carbs. Increase exercise as tolerated.  

## 2015-08-12 NOTE — Progress Notes (Signed)
Subjective:    Patient ID: Rebecca Lewis, female    DOB: February 10, 1946, 69 y.o.   MRN: [id number]  Chief Complaint  Patient presents with  . Follow-up    HPI Patient is in today for follow-up on numerous concerns. She is complaining of diffuse joint pains. Complains of pain in her thumbs which she actually says has improved since stopping Nexium. She also has pains in numerous joints including hips, low back, knees. She complains of myalgias regularly as well. She's had numerous surgeries this past year including a foot surgery which have decreased her ability to exercise. As a result she is frustrated with weight gain. Notes historically having trouble with some elevated liver functions with weight gain. Denies any recent febrile illness. Tried taking non-steroidal anti-inflammatory prescription for pain but developed a headache. Had some tramadol leftover from surgery and that was more helpful. Denies CP/palp/SOB/HA/congestion/fevers/GI or GU c/o. Taking meds as prescribed  Past Medical History  Diagnosis Date  . UTI (lower urinary tract infection)   . Arthritis   . Hyperlipidemia   . Kidney stones   . GERD (gastroesophageal reflux disease)   . Pain in joint, shoulder region 05/12/2015    right  . Neck pain 05/12/2015  . Hyperglycemia 05/12/2015  . History of chicken pox 05/18/2015  . H/O mumps   . H/O measles   . Screen for colon cancer 05/18/2015  . TMJ (temporomandibular joint syndrome) 05/18/2015    Past Surgical History  Procedure Laterality Date  . Appendectomy  1966  . Epicondylitis  08/28/97    Left arm, tennis elbow release  . Carpal tunnel release  09/15/04  . Ulnar shortening with bone graft  08/28/02  . Endoscopic plantar fasciotomy Right 06/21/05  . Neck fusion  6/14.07    C5, C6, C7  . Right ulna shortening Right 11/02/07  . Extracorporeal shock wave lithotripsy Right 02/27/09  . Cataract extraction Right 03/03/09  . Hammer toes Right 03/03/09    right foot 2 hammer toes  corrected  . Cataract extraction Left 03/17/09  . Septoplasty  03/11/10  . Shoulder surgery Right 12/09/10  . Tmj arthroplasty Left 03/27/13  . Lithotripsy Right 04/09/14  . Arthrotomy and menisectomy Right 05/01/15    Family History  Problem Relation Age of Onset  . Cancer Mother     lymphoma, melanoma  . Heart disease Father 39    MI, arteriothrombosis  . Cancer Sister     lung?  . Heart disease Brother   . Heart disease Son   . Heart disease Paternal Uncle   . Alcohol abuse Maternal Grandfather   . Heart disease Maternal Grandfather   . Dementia Paternal Grandmother   . Dementia Paternal Grandfather     Social History   Social History  . Marital Status: Divorced    Spouse Name: N/A  . Number of Children: N/A  . Years of Education: N/A   Occupational History  . retired    Social History Main Topics  . Smoking status: Former Games developer  . Smokeless tobacco: Not on file     Comment: quit in 1997  . Alcohol Use: No  . Drug Use: No  . Sexual Activity: Yes    Birth Control/ Protection: None     Comment: lives alon, no dietary restrictions   Other Topics Concern  . Not on file   Social History Narrative    Outpatient Prescriptions Prior to Visit  Medication Sig Dispense Refill  . Black Cohosh Extract  80 MG CAPS Take 1 tablet by mouth.    . Cholecalciferol (D3 DOTS) 2000 UNITS TBDP Take 1 tablet by mouth daily.    . Chromium Picolinate 500 MCG CAPS Take 1,000 mcg by mouth daily.    Marland Kitchen esomeprazole (NEXIUM) 40 MG capsule Take 40 mg by mouth daily at 12 noon.    Marland Kitchen FA-Pyridoxine-Cyancobalamin (FOLTX PO) Take by mouth.    . Ferrous Sulfate (IRON) 325 (65 FE) MG TABS Take 1 tablet by mouth every 3 (three) days.    . Flaxseed, Linseed, (FLAXSEED OIL) 1000 MG CAPS Take 1 tablet by mouth daily.    . folic acid-pyridoxine-cyancobalamin (FOLTX) 2.5-25-2 MG TABS Take 1 tablet by mouth daily.    . Magnesium Citrate 100 MG TABS Take 200 mg by mouth 2 (two) times daily.    . Omega-3  Fatty Acids (FISH OIL) 1000 MG CAPS Take 950 mg by mouth daily.    . Potassium 99 MG TABS Take 1 tablet by mouth daily.    . ranitidine (ZANTAC) 150 MG tablet Take 150 mg by mouth daily.    . rosuvastatin (CRESTOR) 5 MG tablet Take 5 mg by mouth daily.    . TURMERIC CURCUMIN PO Take 1000 mg daily    . vitamin B-12 (CYANOCOBALAMIN) 1000 MCG tablet Take 1,000 mcg by mouth every 3 (three) days.    Marland Kitchen oxyCODONE (ROXICODONE) 5 MG immediate release tablet Take 5 mg by mouth every 4 (four) hours as needed.     No facility-administered medications prior to visit.    Allergies  Allergen Reactions  . Aleve [Naproxen Sodium]     Pain across Forehead  . Ibuprofen     Pain across forhead  . Poison Ivy Extract [employer] Poison Ivy]   . Vicodin [Hydrocodone-Acetaminophen]     Review of Systems  Constitutional: Negative for fever and malaise/fatigue.  HENT: Negative for congestion.   Eyes: Negative for discharge.  Respiratory: Negative for shortness of breath.   Cardiovascular: Negative for chest pain, palpitations and leg swelling.  Gastrointestinal: Positive for heartburn. Negative for nausea, vomiting and abdominal pain.  Genitourinary: Negative for dysuria.  Musculoskeletal: Positive for joint pain. Negative for falls.       Thumbs, knees, hips, low back. She stopped Nexium and she feels the thumbs are better since then.   Skin: Negative for rash.  Neurological: Negative for loss of consciousness and headaches.  Endo/Heme/Allergies: Negative for environmental allergies.  Psychiatric/Behavioral: Negative for depression. The patient is not nervous/anxious.        Objective:    Physical Exam  Constitutional: She is oriented to person, place, and time. She appears well-developed and well-nourished. No distress.  HENT:  Head: Normocephalic and atraumatic.  Nose: Nose normal.  Eyes: Right eye exhibits no discharge. Left eye exhibits no discharge.  Neck: Normal range of motion. Neck  supple.  Cardiovascular: Normal rate and regular rhythm.   No murmur heard. Pulmonary/Chest: Effort normal and breath sounds normal.  Abdominal: Soft. Bowel sounds are normal. There is no tenderness.  Musculoskeletal: She exhibits no edema.  Neurological: She is alert and oriented to person, place, and time.  Skin: Skin is warm and dry.  Psychiatric: She has a normal mood and affect.  Nursing note and vitals reviewed.   BP 146/73 mmHg  Pulse 81  Temp(Src) 98.3 F (36.8 C) (Oral)  Ht 5' 6.5" (1.689 m)  Wt 166 lb 6.4 oz (75.479 kg)  BMI 26.46 kg/m2  SpO2 98% Wt Readings  from Last 3 Encounters:  08/12/15 166 lb 6.4 oz (75.479 kg)  05/12/15 163 lb (73.936 kg)  11/14/13 160 lb (72.576 kg)     Lab Results  Component Value Date   WBC 4.6 08/05/2015   HGB 13.7 08/05/2015   HCT 40 08/05/2015   PLT 208 08/05/2015   CHOL 210* 08/05/2015   TRIG 147 08/05/2015   HDL 78* 08/05/2015   LDLCALC 103 08/05/2015   ALT 39* 08/05/2015   AST 33 08/05/2015   NA 147 08/05/2015   K 4.8 08/05/2015   CREATININE 0.7 08/05/2015   BUN 12 08/05/2015   TSH 1.38 08/05/2015   HGBA1C 6.0 08/05/2015    Lab Results  Component Value Date   TSH 1.38 08/05/2015   Lab Results  Component Value Date   WBC 4.6 08/05/2015   HGB 13.7 08/05/2015   HCT 40 08/05/2015   PLT 208 08/05/2015   Lab Results  Component Value Date   NA 147 08/05/2015   K 4.8 08/05/2015   BUN 12 08/05/2015   CREATININE 0.7 08/05/2015   ALKPHOS 99 08/05/2015   AST 33 08/05/2015   ALT 39* 08/05/2015   Lab Results  Component Value Date   CHOL 210* 08/05/2015   Lab Results  Component Value Date   HDL 78* 08/05/2015   Lab Results  Component Value Date   LDLCALC 103 08/05/2015   Lab Results  Component Value Date   TRIG 147 08/05/2015   No results found for: CHOLHDL Lab Results  Component Value Date   HGBA1C 6.0 08/05/2015       Assessment & Plan:  Hyperlipidemia Encouraged heart healthy diet, increase  exercise, avoid trans fats, consider a krill oil cap daily  GERD (gastroesophageal reflux disease) Stopped Nexium due to worries about side effects, she feels her thumbs are better. Was taking Ranitidine after that. Avoid offending foods, start probiotics. Do not eat large meals in late evening and consider raising head of bed.   Arthritis Complains of chronic diffuse pain. NSAIDs (Ibuprofen and Aleve) have caused headaches recently. She had some left over Tramadol from a surgery 2 years ago and she says that was helpful. She will contact us if pain persists and she chooses to use Tramadol sparingly. She is warned that if we decide to keep this med in her med list for occasional use it will require a contract, drug testing and a visit every 6 months  Hyperglycemia hgba1c acceptable, minimize simple carbs. Increase exercise as tolerated.  Overweight Encouraged heart healthydiet, decrease po intake and increase exercise as tolerated. Needs 7-8 hours of sleep nightly. Avoid trans fats, eat small, frequent meals every 4-5 hours with lean proteins, complex carbs and healthy fats. Minimize simple carbs  Abnormal liver function test Mild, likely fatty liver disease. Discussed need to minimize simple carbs and fatty foods, attempt mild weight loss and increase exercise, recheck levels with next blood draw   I have discontinued Ms. Lineman's oxyCODONE. I am also having her maintain her rosuvastatin, esomeprazole, FA-Pyridoxine-Cyancobalamin (FOLTX PO), Black Cohosh Extract, Cholecalciferol, Chromium Picolinate, Iron, Flaxseed Oil, Magnesium Citrate, Fish Oil, Potassium, vitamin B-12, folic acid-pyridoxine-cyancobalamin, ranitidine, and TURMERIC CURCUMIN PO.  No orders of the defined types were placed in this encounter.     Danise Edge, MD

## 2015-08-12 NOTE — Assessment & Plan Note (Signed)
Stopped Nexium due to worries about side effects, she feels her thumbs are better. Was taking Ranitidine after that. Avoid offending foods, start probiotics. Do not eat large meals in late evening and consider raising head of bed.

## 2015-08-12 NOTE — Assessment & Plan Note (Signed)
Complains of chronic diffuse pain. NSAIDs (Ibuprofen and Aleve) have caused headaches recently. She had some left over Tramadol from a surgery 2 years ago and she says that was helpful. She will contact us if pain persists and she chooses to use Tramadol sparingly. She is warned that if we decide to keep this med in her med list for occasional use it will require a contract, drug testing and a visit every 6 months

## 2015-08-12 NOTE — Assessment & Plan Note (Signed)
Mild, likely fatty liver disease. Discussed need to minimize simple carbs and fatty foods, attempt mild weight loss and increase exercise, recheck levels with next blood draw

## 2015-08-12 NOTE — Assessment & Plan Note (Signed)
Encouraged heart healthy diet, decrease po intake and increase exercise as tolerated. Needs 7-8 hours of sleep nightly. Avoid trans fats, eat small, frequent meals every 4-5 hours with lean proteins, complex carbs and healthy fats. Minimize simple carbs 

## 2015-08-15 ENCOUNTER — Ambulatory Visit (INDEPENDENT_AMBULATORY_CARE_PROVIDER_SITE_OTHER): Payer: Managed Care, Other (non HMO) | Admitting: Podiatry

## 2015-08-15 ENCOUNTER — Encounter: Payer: Self-pay | Admitting: Podiatry

## 2015-08-15 VITALS — BP 137/67 | HR 80 | Resp 14

## 2015-08-15 DIAGNOSIS — M158 Other polyosteoarthritis: Secondary | ICD-10-CM

## 2015-08-15 DIAGNOSIS — Q667 Congenital pes cavus: Secondary | ICD-10-CM

## 2015-08-15 DIAGNOSIS — M216X9 Other acquired deformities of unspecified foot: Secondary | ICD-10-CM

## 2015-08-15 NOTE — Progress Notes (Signed)
   Subjective:    Patient ID: Rebecca Lewis, female    DOB: August 08, 1946, 69 y.o.   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 She states that she checks in with Dr. Ralene Cork every 6 months or so. She had multiple injuries at work approximate 17 years ago which has resulted in multiple foot issues. She is recently had orthotics made in the last 2 years and at last appointment with Dr. Ralene Cork they were modified. She did sustain a fall in December 2015 prior to when she saw Dr. Ralene Cork last. In February 2016 she underwent a right peroneal tendon repair in June 2016. She's been released by other physician she states that the area is doing well. She. She's had plantar fascial surgery as well. She currently denies any swelling or any redness to her feet. No tingling or numbness. No other complaints at this time.   Review of Systems  All other systems reviewed and are negative.      Objective:   Physical Exam AAO 3, NAD DP/PT pulses palpable, CRT less than 3 seconds Protective sensation appears to be intact with Simms once monofilament Incision along the lateral aspect of the right ankle is well coapted and the scar is formed. There is no pain to palpation over the surgical site. There is no pain along the course of the peroneal tendons. There is no areas of tenderness to bilateral lower extremities. There is no open edema, erythema, increase in warmth. There is an increase in calcaneal inclination angle. MMT 5/5, ROM WNL. There are no open lesions or pre-ulcerative lesions. There is no pain with calf compression, swelling, warmth, erythema. Her orthotics were evaluated and found to be fitting well on the right side. The left side did not appear to be fitting well into the medial arch.    Assessment & Plan:  70 year old female follow up evaluation of right foot pain, and doing  well. -Treatment options discussed including all alternatives, risks, and complications -The orthotic on the left side was modified. A pad was placed with medial arch to fit her arch better. I believe that she may need new orthotics in the near future . If they continue to be modified will make an new pair. -Continue supportive shoe gear. -Review of exercises for peroneal tendon repair. -Follow-up in 6 months or sooner if any problems arise. In the meantime, encouraged to call the office with any questions, concerns, change in symptoms.   Ovid Curd, dPM

## 2015-10-06 ENCOUNTER — Ambulatory Visit (INDEPENDENT_AMBULATORY_CARE_PROVIDER_SITE_OTHER): Admitting: Podiatry

## 2015-10-06 ENCOUNTER — Encounter: Payer: Self-pay | Admitting: Podiatry

## 2015-10-06 VITALS — BP 141/68 | HR 85 | Resp 18

## 2015-10-06 DIAGNOSIS — M722 Plantar fascial fibromatosis: Secondary | ICD-10-CM

## 2015-10-06 DIAGNOSIS — M216X9 Other acquired deformities of unspecified foot: Secondary | ICD-10-CM

## 2015-10-06 DIAGNOSIS — M25371 Other instability, right ankle: Secondary | ICD-10-CM

## 2015-10-06 NOTE — Progress Notes (Signed)
Patient ID: Rebecca DecampLinda Lewis, female   DOB: 1946/04/08, 69 y.o.   MRN: [id number]  Subjective:  69 year old female presents the office today concerns of pain to the office aspect of her right ankle. She previously had a peroneal tendon repair done earlier this year by another physician. Over the last couple weeks she states that she did increase her activity in one day she did start to have increasing pain on the outside portion of her ankle as she didn't work out quite a bit one day. She did follow-up with the surgeon who did appear nail repair she was concerned that she re-tore. He did refer her to physical therapy for which she has been doing. She does continue the orthotics. She has not had an MRI. She'll discussed possible further treatments. No other complaints at this time.   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 There is a scar on lateral aspect of the right ankle from previous nail tendon repair. There is no patella course the peroneal tendon. There is discomfort on the course the ATFL and the CFL on the right side. There is no pain on the course of the PTFL.  There does appear to be a slight increase in anterior drawer compared to the contralateral extremity. Talar tilt test was negative.  There is no pinpoint bony tenderness on the fibula, tibia, syndesmosis or to other areas of the foot.There is a very small amount of edema overlying the lateral aspect of the ankle along the ATFL. There is no  Significant erythema or increase in warmth. There is hammertoe contractures present bilaterally. There is no other areas of tenderness to bilateral lower extremities. No open lesions or pre-ulcerative lesions. No pain with calf compression, swelling, warmth, erythema.  Assessment:  69 year old female with right lateral ankle instability, possible ATFL tear.    Plan: -Treatment options discussed including all alternatives, risks, and complications - I discussed the possible MRI how she wishes to hold off on this time. She screws had orthotics made but we have modify them on multiple attempts. I discussed her a new pair of orthotics to help control her foot type given her cavus foot. I believe the reason why she had increased pain in the outside portion of her ankle as she was rolling off of her orthotic and she rolls her foot when she walks. He'll continue orthotic and help stabilize her foot more. She wishes to try orthotics before proceeding with MRI or surgery. -She was scanned for [facility] today and [facility] -Follow-up in 3 weeks to pick up orthotics or sooner if any problems arise. In the meantime, encouraged to call the office with any questions, concerns, change in symptoms.   Ovid CurdMatthew Qunisha Bryk, DPM

## 2015-10-14 DIAGNOSIS — R6889 Other general symptoms and signs: Secondary | ICD-10-CM

## 2015-10-14 DIAGNOSIS — IMO0001 Reserved for inherently not codable concepts without codable children: Secondary | ICD-10-CM | POA: Insufficient documentation

## 2015-10-20 ENCOUNTER — Telehealth: Payer: Self-pay | Admitting: *Deleted

## 2015-10-20 DIAGNOSIS — M25371 Other instability, right ankle: Secondary | ICD-10-CM

## 2015-10-20 NOTE — Telephone Encounter (Signed)
MRI right ankle to rule out tear without contrast. Thanks.

## 2015-10-20 NOTE — Telephone Encounter (Addendum)
Pt states she had discussed the MRI of the right ankle, and now she would like to get one.  Orders faxed and pt informed.  Prior authorization started for MRI right ankle 73721, dx M25.371, faxed to ATTN:  Nurse Reviewer [phone], reference [phone], clinical from Dr. Ardelle AntonWagoner, and MRI orders.  Cindy from [facility] asked if x-rays were performed, I told her 12/24/2014.  PRIOR AUTHORIZATION FAX received for [id number] MRI right lower extremity, AUTH:  [id number] VALID 10/21/2015 - 01/21/2016.  PRIOR AUTH FAXED TO [facility].

## 2015-10-31 ENCOUNTER — Ambulatory Visit: Payer: Self-pay | Admitting: *Deleted

## 2015-10-31 DIAGNOSIS — M722 Plantar fascial fibromatosis: Secondary | ICD-10-CM

## 2015-10-31 NOTE — Progress Notes (Signed)
Patient ID: Rebecca DecampLinda Lewis, female   DOB: 10/06/1946, 69 y.o.   MRN: [id number] Patient presents for orthotic pick up.  Patient states that they are immediately uncomfortable and that is feels like the arch is too high.  Will send the orthotics back and have the arches lowered slightly.

## 2015-10-31 NOTE — Patient Instructions (Signed)

## 2015-11-04 ENCOUNTER — Ambulatory Visit
Admission: RE | Admit: 2015-11-04 | Discharge: 2015-11-04 | Disposition: A | Discharge status: Home / Self Care | Payer: 59 | Source: Ambulatory Visit | Attending: Podiatry | Admitting: Podiatry

## 2015-11-04 DIAGNOSIS — M25371 Other instability, right ankle: Secondary | ICD-10-CM

## 2015-11-10 ENCOUNTER — Telehealth: Payer: Self-pay | Admitting: Family Medicine

## 2015-11-10 ENCOUNTER — Telehealth: Payer: Self-pay | Admitting: Podiatry

## 2015-11-10 NOTE — Telephone Encounter (Signed)
Patient signed a form requesting her medical records be mailed to her. However, she did not write down what dates of her medical records she is requesting. I called and left a message asking her to call back to let me know does she want all of the dates or just certain ones.

## 2015-11-10 NOTE — Telephone Encounter (Signed)
Left message on VM for patient to call about Flu Shot °

## 2015-11-11 NOTE — Telephone Encounter (Signed)
Patient called back to state that she never takes the [employer] Shot.

## 2015-11-11 NOTE — Telephone Encounter (Signed)
Documented that patient declined flu shot in her overdue health maintenance.

## 2015-11-14 ENCOUNTER — Encounter: Payer: Self-pay | Admitting: Podiatry

## 2015-11-14 ENCOUNTER — Ambulatory Visit (INDEPENDENT_AMBULATORY_CARE_PROVIDER_SITE_OTHER): Payer: 59 | Admitting: Podiatry

## 2015-11-14 VITALS — BP 145/78 | HR 74 | Resp 18

## 2015-11-14 DIAGNOSIS — T148 Other injury of unspecified body region: Secondary | ICD-10-CM

## 2015-11-14 DIAGNOSIS — M216X9 Other acquired deformities of unspecified foot: Secondary | ICD-10-CM | POA: Diagnosis not present

## 2015-11-14 DIAGNOSIS — M25371 Other instability, right ankle: Secondary | ICD-10-CM | POA: Diagnosis not present

## 2015-11-14 DIAGNOSIS — T148XXA Other injury of unspecified body region, initial encounter: Secondary | ICD-10-CM

## 2015-11-14 NOTE — Telephone Encounter (Signed)
Patient has an appointment with Dr. Ardelle AntonWagoner this morning. I have put in the ov note to have whoever check's her in to verify what dates she is wanting her Medical Records on so that I can complete them.

## 2015-11-17 NOTE — Progress Notes (Signed)
Patient ID: Rebecca DecampLinda Lewis, female   DOB: 01/24/1946, 69 y.o.   MRN: [id number]  Subjective: 69 year old female presents the office today for follow-up evaluation of pain to her right ankle. She previously had a peroneal tendon repair done earlier this year by another physician. Over the last couple weeks she states that she did increase her activity in one day she did start to have increasing pain on the outside portion of her ankle as she didn't work out quite a bit one day. She did follow-up with the surgeon who did the peroneal repair she was concerned that she re-tore. He did refer her to physical therapy for which she has been doing. She also presents today to pick up orthotics. No other complaints at this time.  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 There is a scar on lateral aspect of the right ankle from previous nail tendon repair. There is mild pain along the course the peroneal tendon. There is discomfort on the course the ATFL on the right side. There is no pain on the course of the CFL or PTFL.  There does appear to be a slight increase in anterior drawer compared to the contralateral extremity. Talar tilt test was negative.  There is no pinpoint bony tenderness on the fibula, tibia, syndesmosis or to other areas of the foot.There is a very small amount of edema overlying the lateral aspect of the ankle along the ATFL. There is no  There is no significant erythema or increase in warmth. There is hammertoe contractures present bilaterally. There is no other areas of tenderness to bilateral lower extremities. No open lesions or pre-ulcerative lesions. No pain with calf compression, swelling, warmth, erythema.  Assessment: 69 year old female with right lateral ankle pain   Plan: -Treatment options discussed including all alternatives, risks, and  complications -MRI results were discussed the patient. At this point I recommended her to go back to physical therapy. A prescription was given for this today. Discussed possible surgical intervention if needed in the future. Also follow up with another physician who performed the repair. -Orthotics were dispensed. On the left side she felt that she was rolling out the side source of the orthotic back to lower the arch slightly into deep in the heel cup. -Follow-up to pick up orthotics or sooner if any problems arise. In the meantime, encouraged to call the office with any questions, concerns, change in symptoms.   Ovid CurdMatthew Wagoner, DPM

## 2015-11-24 ENCOUNTER — Telehealth: Payer: Self-pay | Admitting: *Deleted

## 2015-11-24 NOTE — Telephone Encounter (Signed)
I briefly talked to her about surgery, but I would need to see her again to decide on the procedure. Also, she should follow-up with the doctor that did her peroneal repair over the summer.

## 2015-11-24 NOTE — Telephone Encounter (Addendum)
Pt states she is looking to get the ankle surgery before her mission trip in June 2017, and was looking at the recovery time as cutting it very close to the June time period.  Pt states she would like to get the paperwork and insurance started now, because she knows it will take 2-3 weeks to be pre-certed and wanted to know what could be done now.   I told pt I would inform Dr. Ardelle AntonWagoner and see if there was anything to do now, before her 12/15/2015 appt.  Left message informing pt of Dr. Gabriel RungWagoner's recommendations.

## 2015-11-27 ENCOUNTER — Telehealth: Payer: Self-pay | Admitting: *Deleted

## 2015-11-28 NOTE — Telephone Encounter (Signed)
I'm returning your call.  How can I help you.  "When I saw Dr. Ardelle AntonWagoner last he said I was going to have to have surgery.  My question is how long will it take for [employer] to approve my surgery?"  It could take 2-3 weeks.  "That's what I thought.  I need to go ahead and get it scheduled because I'm supposed to do a mission trip this summer with my daughter and she will not forgive me if I can't do it because of my foot.  So, I want to get things moving.  Where does he do surgeries?"  He does surgery at [facility] located on [address]  "Okay, that is the same place I had it before.  I'm scheduled to see him on Tuesday I guess to sign consent forms and everything."

## 2015-11-28 NOTE — Telephone Encounter (Signed)
"  Hi Rebecca Lewis, could you give me a call?  I have a few questions I need to ask."

## 2015-12-02 ENCOUNTER — Encounter: Payer: Self-pay | Admitting: Podiatry

## 2015-12-02 ENCOUNTER — Ambulatory Visit (INDEPENDENT_AMBULATORY_CARE_PROVIDER_SITE_OTHER): Payer: 59 | Admitting: Podiatry

## 2015-12-02 ENCOUNTER — Telehealth: Payer: Self-pay | Admitting: *Deleted

## 2015-12-02 VITALS — BP 145/74 | HR 76 | Resp 12

## 2015-12-02 DIAGNOSIS — T148 Other injury of unspecified body region: Secondary | ICD-10-CM

## 2015-12-02 DIAGNOSIS — T148XXA Other injury of unspecified body region, initial encounter: Secondary | ICD-10-CM

## 2015-12-02 NOTE — Telephone Encounter (Signed)
"  I scheduled surgery for 12/17/2015.  Do I have to pay any money up front?  If so, how much?"  We will file it with your insurance first then send you a statement.  I am not sure about the facility.  You will need to call them regarding their process.  "Will you all let me know if it can't be authorized before surgery?"  Yes, we will see if it needs to be pre-certified prior to surgery and take care of it.  If not able to be done prior to surgery, we will let you know.

## 2015-12-02 NOTE — Patient Instructions (Signed)

## 2015-12-03 ENCOUNTER — Telehealth: Payer: Self-pay | Admitting: *Deleted

## 2015-12-03 DIAGNOSIS — T148XXA Other injury of unspecified body region, initial encounter: Secondary | ICD-10-CM

## 2015-12-03 NOTE — Progress Notes (Signed)
Patient ID: Rebecca DecampLinda Biggins, female   DOB: [birth year], 70 y.o.   MRN: [id number]  Subjective: 70 year old female presents the office today for follow-up evaluation of pain to her right ankle. She states that she can use have pain in the outside part of the ankle and she is concerned that she did feel that she previously has torn the tendon after was repaired given the MRI findings shouldn't to proceed with surgical intervention at this time. She has continue with the brace as needed as well as orthotics and supportive shoe gear which she continues to have pain. She has not followed the physician U to the original surgery recently just discharged from their care. No other complaints at this time.  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 There is a scar on lateral aspect of the right ankle from previous nail tendon repair. There is mild pain along the course the peroneal tendon. The majority pain seems to be localized on the distal portion incision just distally to the incision proximal to the fifth metatarsal base. The peroneal tendons appear to be intact. There is no pain on the course the ATFL or to the other ankle ligaments. There is a slight positive anterior drawer compared to contralateral extremity however mild. There is no area pinpoint tenderness or pain the vibratory sensation. No open lesions or pre-ulcerative lesions. No pain with calf compression, swelling, warmth, erythema.   Assessment: 70 year old female with right lateral ankle pain; peroneal tendon injury   Plan: -Treatment options discussed including all alternatives, risks, and complications -Due to her continued pain MRI findings she is requesting surgical intervention to repair the peroneal tendons. This is the majority of her pain is localized. -Discussed the peroneal tendon repair and she wishes to  proceed. -The incision placement as well as the postoperative course was discussed with the patient. I discussed risks of the surgery which include, but not limited to, infection, bleeding, pain, swelling, need for further surgery, delayed or nonhealing, painful or ugly scar, numbness or sensation changes, over/under correction, recurrence, transfer lesions, further deformity, hardware failure, DVT/PE, loss of toe/foot. Patient understands these risks and wishes to proceed with surgery. The surgical consent was reviewed with the patient all 3 pages were signed. No promises or guarantees were given to the outcome of the procedure. All questions were answered to the best of my ability. Before the surgery the patient was encouraged to call the office if there is any further questions. [facility] basis.  *He would like to pick up Percocet 10/325 couple days before surgery to get that filled that she is unable to do it after surgery. I told her we can do that for her.  Ovid CurdMatthew Cheri Ayotte, DPM

## 2015-12-03 NOTE — Telephone Encounter (Addendum)
Pt request Dr. Ardelle AntonWagoner order a knee scooter for DOS 12/17/2015, and to have it and her Percocet available for pick up 12/15/2015.  12/09/2015 - pt [place] and would like to pick up her pain medication prescriptions and prescription for the knee scooter.

## 2015-12-05 NOTE — Addendum Note (Signed)
Addended by: Hadley PenOX, Ryeleigh Santore R on: 12/05/2015 08:33 AM   Modules accepted: Orders

## 2015-12-09 MED ORDER — OXYCODONE-ACETAMINOPHEN 10-325 MG PO TABS: 1.0000 | ORAL_TABLET | Freq: Four times a day (QID) | ORAL | Status: DC | PRN

## 2015-12-09 NOTE — Telephone Encounter (Signed)
I'm calling to let you know we sent your order for the knee scooter to [facility].  You should receive a call from them.  You will have to come by to pick up your prescription.  It cannot be called into the pharmacy.  "Okay, I will come by Friday to pick it up."  Will they file the scooter with my insurance?"  I assume they will but you can ask them when they call.  "Will they deliver it?"  I don't know, you will have to ask them.  "Do you know when they will call me?"  I can't give you a date or time.  "Okay, I'll be waiting for their call.  Thanks for helping me."

## 2015-12-12 ENCOUNTER — Telehealth: Payer: Self-pay | Admitting: *Deleted

## 2015-12-12 NOTE — Telephone Encounter (Signed)
We will start with this and if she needs it then it can be refilled

## 2015-12-12 NOTE — Telephone Encounter (Signed)
"  I spoke to the patient and she stated she may need to cancel her surgery. She stated she was given a prescription for pain medication.  It is not going to last her after surgery.  She stated she needed more.  Patient is having a block so she should be numb for a while.   I just wanted to let you all know.  Please call patient."  I think Joya SanValery is addressing this with patient, she spoke to Dr. Ardelle AntonWagoner regarding this situation.

## 2015-12-12 NOTE — Telephone Encounter (Addendum)
Pt PRESENTED to the office stating that #30 Percocet is not enough to cover her pain after surgery.  Pt states she has had 21 surgeries and if anyone would be addicted to Percocet it would be her, she stated she also take 3 pills a day.  Pt states she will not be able to get the Percocet filled on Wednesday, because she has 2 church people carrying her into her house.  I told pt I would inform you and call for her to pick up rx on Monday, but the prescribed rx is what is routinely prescribed by the doctors for this type of surgery.  Pt CALLED states will fill the Percocet she has now, so as not to make it confusing when we write her for more Percocet.  Pt asked if she needed to speak or write Dr. Ardelle AntonWagoner a letter to discuss the amount of Percocet she was to be prescribed.  Delydia asked me if I was working with pt, that [facility] had called and she was complaining to them about the amount of Percocet that had been prescribed.  Pt CALLED states she has been to her medical doctor and had blood work and he states he would prefer she was ordered Oxycodone 10, NOT the Percocet that has Acetaminophen.  Pt states she will keep the Percocet prescription to give back to us on Monday. 12/15/2015 - I called pt to inform that Dr. Ardelle AntonWagoner would change the Percocet to Oxycodone 10mg , for her to bring the Percocet rx in to exchange and shred.  Pt asked how many Dr. Ardelle AntonWagoner was going to dispense, I told her #40, and she said that was not enough.  Pt CALLED again and stated she takes 8 pain pills a day and would need 80 pain pills to get her to her 1st POV, she will not have available transportation to come back and forth for prescriptions.  Pt CALLED states she will pick up the #80 Oxycodone and is aware this will be the only rx of this medication for her post op recovery per Dr. Ardelle AntonWagoner.  Pt PRESENTS to office to pick up surgical post op rx for Oxycodone 10mg  one tablet every 4-6 hours prn foot pain #80  to be taken over the course of pt's post op recovery period without future refills, these orders are per Dr. Ardelle AntonWagoner.  12/23/2015 - DR. Ardelle AntonWAGONER ordered 2 view chest x-rays r/o aspiration and r/o pulmonary emboli, to be performed at [facility] as requested by pt.  I spoke with Shon HaleMary Beth at [facility], she states pt may walk-in and not schedule an appt for chest x-rays if orders are faxed to [facility] [phone] and to her facility [phone].  Left message for pt to go to [facility] at [address] [place] and appt line [phone].  [facility] to [facility].  Faxed to [facility]. 12/26/2015 - PT ASKED if she need to ice every hour after the 1st 3 days, and does she need to continue to elevate her foot.  I told pt that icing and elevating after the 1st 3 days was basically for comfort and to reduce edema, that dangling was a NO-NO, and being up on the foot more than 5 minutes an hour was a NO-NO too, level on with the hip on the bed was best.  12/30/2015 - INFORMED pt the chest x-ray of 12/24/2015 were negative.  Pt states good and is doing good otherwise.

## 2015-12-14 NOTE — Telephone Encounter (Signed)
Oxycodone 10mg  1 tablet PO q4-6 hr prn pain. You can dispense #40.

## 2015-12-15 ENCOUNTER — Ambulatory Visit: Payer: Self-pay | Admitting: Podiatry

## 2015-12-15 MED ORDER — OXYCODONE HCL 10 MG PO TABS: ORAL_TABLET | ORAL | Status: DC

## 2015-12-15 NOTE — Telephone Encounter (Signed)
She will have to make an appointment to come in

## 2015-12-16 ENCOUNTER — Ambulatory Visit: Payer: Self-pay | Admitting: Family Medicine

## 2015-12-16 ENCOUNTER — Other Ambulatory Visit: Payer: Self-pay | Admitting: Podiatry

## 2015-12-16 MED ORDER — PROMETHAZINE HCL 25 MG PO TABS: 25.0000 mg | ORAL_TABLET | Freq: Three times a day (TID) | ORAL | Status: DC | PRN

## 2015-12-16 MED ORDER — CEPHALEXIN 500 MG PO CAPS: 500.0000 mg | ORAL_CAPSULE | Freq: Three times a day (TID) | ORAL | Status: DC

## 2015-12-17 ENCOUNTER — Encounter: Payer: Self-pay | Admitting: Podiatry

## 2015-12-17 DIAGNOSIS — T148 Other injury of unspecified body region: Secondary | ICD-10-CM | POA: Diagnosis not present

## 2015-12-23 ENCOUNTER — Ambulatory Visit (INDEPENDENT_AMBULATORY_CARE_PROVIDER_SITE_OTHER): Payer: 59 | Admitting: Podiatry

## 2015-12-23 ENCOUNTER — Encounter: Payer: Self-pay | Admitting: Podiatry

## 2015-12-23 VITALS — BP 159/86 | HR 85 | Temp 99.5°F | Resp 16

## 2015-12-23 DIAGNOSIS — Z9889 Other specified postprocedural states: Secondary | ICD-10-CM

## 2015-12-23 DIAGNOSIS — T148XXA Other injury of unspecified body region, initial encounter: Secondary | ICD-10-CM

## 2015-12-23 DIAGNOSIS — T148 Other injury of unspecified body region: Secondary | ICD-10-CM

## 2015-12-26 ENCOUNTER — Telehealth: Payer: Self-pay

## 2015-12-26 NOTE — Telephone Encounter (Signed)
Returned pt call, left vm to return at her convenience

## 2015-12-29 NOTE — Progress Notes (Signed)
Patient ID: Rebecca Lewis, female   DOB: 1946/06/06, 70 y.o.   MRN: [id number]  Subjective: Rebecca Lewis is a 70 y.o. is seen today in office s/p right peroneal tendon repair preformed on 12/17/15. They state their pain is improved. She fell on 12/20/15 and she heard a "pop".  She's entrance a nonweightbearing as possible. Also the Friday after her surgery she develop sudden chest pain as well as shortness of breath which lasted for about 1 hour. She states that she did not want to call 911 because she did not want to bother anybody. She states the symptoms are resolving she's not had any symptoms since then. She has called her primary care physician but she has not yet heard back. I strongly encouraged her that of the symptoms occur again she is to go immediately to the emergency room or call 911. Denies any systemic complaints such as fevers, chills, nausea, vomiting. No calf pain, chest pain, shortness of breath.   Objective: General: No acute distress, AAOx3  DP/PT pulses palpable 2/4, CRT < 3 sec to all digits.  Protective sensation intact. Motor function intact.  Right foot:  Cast is intact with minimal weight on the bottom. The cast was removed today. Incision is well coapted without any evidence of dehiscence  And staples are intact. There is no surrounding erythema, ascending cellulitis, fluctuance, crepitus, malodor, drainage/purulence. There is minimal edema around the surgical site. There is mild pain along the surgical site.  No other areas of tenderness to bilateral lower extremity is. No other areas of tenderness to bilateral lower extremities.  No other open lesions or pre-ulcerative lesions.  No pain with calf compression, swelling, warmth, erythema.   Assessment and Plan:  Status post right peroneal tendon repair  -Treatment options discussed including all alternatives, risks, and complications -Ice/elevation -X-rays were obtained and reviewed with the patient.  -Will order chest x-ray   However strongly encouraged her she is a fall with her primary care physician this week. - The cast was removed today. Antibiotic ointment placed of the incision followed by dry dressing and a well-padded below-knee fiberglass cast applied and made sure to pad all bony prominences. -Pain medication as needed. -Monitor for any clinical signs or symptoms of infection and DVT/PE and directed to call the office immediately should any occur or go to the ER. -Follow-up in 1 week for cast change and possible staple removal or sooner if any problems arise. In the meantime, encouraged to call the office with any questions, concerns, change in symptoms.   Ovid Curd, DPM

## 2016-01-05 ENCOUNTER — Ambulatory Visit (INDEPENDENT_AMBULATORY_CARE_PROVIDER_SITE_OTHER): Payer: 59 | Admitting: Podiatry

## 2016-01-05 DIAGNOSIS — Z9889 Other specified postprocedural states: Secondary | ICD-10-CM

## 2016-01-05 DIAGNOSIS — T148 Other injury of unspecified body region: Secondary | ICD-10-CM

## 2016-01-05 DIAGNOSIS — T148XXA Other injury of unspecified body region, initial encounter: Secondary | ICD-10-CM

## 2016-01-05 MED ORDER — OXYCODONE HCL 10 MG PO TABS: ORAL_TABLET | ORAL | Status: DC

## 2016-01-09 NOTE — Progress Notes (Signed)
Patient ID: Rebecca Lewis, female   DOB: 1946/01/25, 70 y.o.   MRN: [id number]  Subjective: Rebecca Lewis is a 70 y.o. is seen today in office s/p right peroneal tendon repair preformed on 12/17/15. She states that she is doing much better  Than she did atlast appointment. She has not had any recurrence of the symptoms that she was having previously. She states that she is taking 3 Percocet a day and she has decreasing amounts she is taking. She has been nonweightbearing as much as possible. She's had no other recent injury or trauma. There's been no other complaints at this time. Denies any systemic complaints such as fevers, chills, nausea, vomiting. No calf pain, chest pain, shortness of breath.   Objective: General: No acute distress, AAOx3  DP/PT pulses palpable 2/4, CRT < 3 sec to all digits.  Protective sensation intact. Motor function intact.  Right foot:  Cast is intact with minimal wear on the bottom. The cast was removed today. Incision is well coapted without any evidence of dehiscence and staples are intact. There is no surrounding erythema, ascending cellulitis, fluctuance, crepitus, malodor, drainage/purulence. There is minimal edema around the surgical site. There is mild pain along the surgical site.  No other areas of tenderness to bilateral lower extremities.  No other areas of tenderness to bilateral lower extremities.  No other open lesions or pre-ulcerative lesions.  No pain with calf compression, swelling, warmth, erythema.   Assessment and Plan:  Status post right peroneal tendon repair; doing well.   -Treatment options discussed including all alternatives, risks, and complications -The cast was removed today and a radical no splint followed by dressing. The staples were also removed. A well-padded below-knee fiberglass cast was applied mixture to pack all bony prominences. Continue nonweightbearing all times. Aspirin daily. -Pain medication as needed. Did refill today. -CXR  negative -Monitor for any clinical signs or symptoms of infection and DVT/PE and directed to call the office immediately should any occur or go to the ER. -Follow-up in 2 weeks or sooner if any problems arise. In the meantime, encouraged to call the office with any questions, concerns, change in symptoms.   Ovid Curd, DPM

## 2016-01-19 ENCOUNTER — Encounter: Payer: Self-pay | Admitting: Podiatry

## 2016-01-19 ENCOUNTER — Ambulatory Visit (INDEPENDENT_AMBULATORY_CARE_PROVIDER_SITE_OTHER): Payer: 59 | Admitting: Podiatry

## 2016-01-19 VITALS — BP 156/75 | HR 68 | Resp 18

## 2016-01-19 DIAGNOSIS — T148 Other injury of unspecified body region: Secondary | ICD-10-CM

## 2016-01-19 DIAGNOSIS — Z9889 Other specified postprocedural states: Secondary | ICD-10-CM

## 2016-01-19 DIAGNOSIS — T148XXA Other injury of unspecified body region, initial encounter: Secondary | ICD-10-CM

## 2016-01-19 MED ORDER — OXYCODONE HCL 10 MG PO TABS: ORAL_TABLET | ORAL | Status: DC

## 2016-01-20 DIAGNOSIS — T148XXA Other injury of unspecified body region, initial encounter: Secondary | ICD-10-CM | POA: Insufficient documentation

## 2016-01-20 NOTE — Progress Notes (Signed)
Patient ID: Rogue Pautler, female   DOB: Mar 21, 1946, 70 y.o.   MRN: [id number]  Subjective: Jaida Basurto is a 70 y.o. is seen today in office s/p right peroneal tendon repair preformed on 12/17/15. She states that she is doing much better and she has decreased amount of pain medicine she has been taking. She has tried to stay nonweightbearing as much as possible. She presents a wheelchair. No recent injury or trauma. No other complaints. Denies any systemic complaints such as fevers, chills, nausea, vomiting. No calf pain, chest pain, shortness of breath.   Objective: General: No acute distress, AAOx3  DP/PT pulses palpable 2/4, CRT < 3 sec to all digits.  Protective sensation intact. Motor function intact.  Right foot:  Cast is intact with minimal wear on the bottom. The cast was removed today. Incision is well coapted without any evidence of dehiscence and scar is forming. There is no surrounding erythema, ascending cellulitis, fluctuance, crepitus, malodor, drainage/purulence. There is minimal edema around the surgical site. There is decreasedpain along the surgical site and appears to be much improved compared to prior to surgery.  No other areas of tenderness to bilateral lower extremities.  No other areas of tenderness to bilateral lower extremities.  No other open lesions or pre-ulcerative lesions.  No pain with calf compression, swelling, warmth, erythema.   Assessment and Plan:  Status post right peroneal tendon repair; doing well.   -Treatment options discussed including all alternatives, risks, and complications -The cast was removed today and she was placed into a cam boot. Antibiotic Kensli was applied over the incision followed by dressing. She can start to shower. Continue with antibiotic ointment overlying the incision daily. -Continue nonweightbearing. -Aspirin daily. -Start gentle range of motion exercises. -Wear cam boot at all times, even at night. -Pain medication as needed. Did  refill today. -Monitor for any clinical signs or symptoms of infection and DVT/PE and directed to call the office immediately should any occur or go to the ER. -Follow-up in 2 weeks or sooner if any problems arise. In the meantime, encouraged to call the office with any questions, concerns, change in symptoms.  *will likely transition to WBAT in CAM boot next appointment and PT consult  Ovid Curd, DPM

## 2016-02-04 ENCOUNTER — Encounter: Payer: Self-pay | Admitting: Podiatry

## 2016-02-04 ENCOUNTER — Ambulatory Visit (INDEPENDENT_AMBULATORY_CARE_PROVIDER_SITE_OTHER): Payer: 59 | Admitting: Podiatry

## 2016-02-04 DIAGNOSIS — T148XXA Other injury of unspecified body region, initial encounter: Secondary | ICD-10-CM

## 2016-02-04 DIAGNOSIS — T148 Other injury of unspecified body region: Secondary | ICD-10-CM

## 2016-02-04 DIAGNOSIS — Z9889 Other specified postprocedural states: Secondary | ICD-10-CM

## 2016-02-04 MED ORDER — CEPHALEXIN 500 MG PO CAPS: 500.0000 mg | ORAL_CAPSULE | Freq: Three times a day (TID) | ORAL | Status: DC

## 2016-02-04 NOTE — Patient Instructions (Signed)
Peroneal Tendinitis With Rehab Tendonitis is inflammation of a tendon. Inflammation of the tendons on the back of the outer ankle (peroneal tendons) is known as peroneal tendonitis. The peroneal tendons are responsible for connecting the muscles that allow you to stand on your tiptoes to the bones of the ankle. For this reason, peroneal tendonitis often causes pain when trying to complete such motions. Peroneal tendonitis often involves a tear (strain) of the peroneal tendons. Strains are classified into three categories. Grade 1 strains cause pain, but the tendon is not lengthened. Grade 2 strains include a lengthened ligament, due to the ligament being stretched or partially ruptured. With grade 2 strains there is still function, although function may be decreased. Grade 3 strains involve a complete tear of the tendon or muscle, and function is usually impaired. SYMPTOMS   Pain, tenderness, swelling, warmth, or redness over the back of the outer side of the ankle, the outer part of the mid-foot, or the bottom of the arch.  Pain that gets worse with ankle motion (especially when pushing off or pushing down with the front of the foot), or when standing on the ball of the foot or pushing the foot outward.  Crackling sound (crepitation) when the tendon is moved or touched. CAUSES  Peroneal tendinitis occurs when injury to the peroneal tendons causes the body to respond with inflammation. Common causes of injury include:  An overuse injury, in which the groove behind the outer ankle (where the tendon is located) causes wear on the tendon.  A sudden stress placed on the tendon, such as from an increase in the intensity, frequency, or duration of training.  Direct hit (trauma) to the tendon.  Return to activity too soon after a previous ankle injury. RISK INCREASES WITH:  Sports that require sudden, repetitive pushing off of the foot, such as jumping or quick starts.  Kicking and running sports,  especially running down hills or long distances.  Poor strength and flexibility.  Previous injury to the foot, ankle, or leg. PREVENTION  Warm up and stretch properly before activity.  Allow for adequate recovery between workouts.  Maintain physical fitness:  Strength, flexibility, and endurance.  Cardiovascular fitness.  Complete rehabilitation after previous injury. PROGNOSIS  If treated properly, peroneal tendonitis usually heals within 6 weeks.  RELATED COMPLICATIONS  Longer healing time, if not properly treated or if not given enough time to heal.  Recurring symptoms if activity is resumed too soon, with overuse, or when using poor technique.  If untreated, tendinitis may result in tendon rupture, requiring surgery. TREATMENT  Treatment first involves the use of ice and medicine to reduce pain and inflammation. The use of strengthening and stretching exercises may help reduce pain with activity. These exercises may be performed at unsuccessful, surgery to remove the inflamed tendon lining (sheath) may be advised.  MEDICATION   If pain medicine is needed, nonsteroidal anti-inflammatory medicines (aspirin and ibuprofen), or other minor pain relievers (acetaminophen), are often advised.  Do not take pain medicine for 7 days before surgery.  Prescription pain relievers may be given, if your caregiver thinks they are needed. Use only as directed and only as much as you need. HEAT AND COLD  Cold treatment (icing) should be applied for 10 to 15 minutes every 2 to 3 hours for inflammation and pain, and immediately after activity that aggravates your symptoms. Use ice packs or an ice massage.  Heat treatment may be used before performing stretching and strengthening activities prescribed by your   caregiver, physical therapist, or athletic trainer. Use a heat pack or a warm water soak. SEEK MEDICAL CARE IF:  Symptoms get worse or do not improve in 2 to 4 weeks, despite  treatment.  New, unexplained symptoms develop. (Drugs used in treatment may produce side effects.) EXERCISES RANGE OF MOTION (ROM) AND STRETCHING EXERCISES - Peroneal Tendinitis These exercises may help you when beginning to rehabilitate your injury. Your symptoms may resolve with or without further involvement from your physician, physical therapist or athletic trainer. While completing these exercises, remember:   Restoring tissue flexibility helps normal motion to return to the joints. This allows healthier, less painful movement and activity.  An effective stretch should be held for at least 30 seconds.  A stretch should never be painful. You should only feel a gentle lengthening or release in the stretched tissue. RANGE OF MOTION - Ankle Eversion  Sit with your right / left ankle crossed over your opposite knee.  Grip your foot with your opposite hand, placing your thumb on the top of your foot and your fingers across the bottom of your foot.  Gently push your foot downward with a slight rotation, so your littlest toes rise slightly toward the ceiling.  You should feel a gentle stretch on the inside of your ankle. Hold the stretch for __________ seconds. Repeat __________ times. Complete this exercise __________ times per day.  RANGE OF MOTION - Ankle Inversion  Sit with your right / left ankle crossed over your opposite knee.  Grip your foot with your opposite hand, placing your thumb on the bottom of your foot and your fingers across the top of your foot.  Gently pull your foot so the smallest toe comes toward you and your thumb pushes the inside of the ball of your foot away from you.  You should feel a gentle stretch on the outside of your ankle. Hold the stretch for __________ seconds. Repeat __________ times. Complete this exercise __________ times per day.  RANGE OF MOTION - Ankle Plantar Flexion  Sit with your right / left leg crossed over your opposite knee.  Use  your opposite hand to pull the top of your foot and toes toward you.  You should feel a gentle stretch on the top of your foot and ankle. Hold this position for __________ seconds. Repeat __________ times. Complete __________ times per day.  STRETCH - Gastroc, Standing  Place your hands on a wall.  Extend your right / left leg behind you, keeping the front knee somewhat bent.  Slightly point your toes inward on your back foot.  Keeping your right / left heel on the floor and your knee straight, shift your weight toward the wall, not allowing your back to arch.  You should feel a gentle stretch in the calf. Hold this position for __________ seconds. Repeat __________ times. Complete this stretch __________ times per day. STRETCH - Soleus, Standing  Place your hands on a wall.  Extend your right / left leg behind you, keeping the other knee somewhat bent.  Slightly point your toes inward on your back foot.  Keep your heel on the floor, bend your back knee, and slightly shift your weight over the back leg so that you feel a gentle stretch deep in your back calf.  Hold this position for __________ seconds. Repeat __________ times. Complete this stretch __________ times per day. STRETCH - Gastrocsoleus, Standing Note: This exercise can place a lot of stress on your foot and ankle. Please   complete this exercise only if specifically instructed by your caregiver.   Place the ball of your right / left foot on a step, keeping your other foot firmly on the same step.  Hold on to the wall or a rail for balance.  Slowly lift your other foot, allowing your body weight to press your heel down over the edge of the step.  You should feel a stretch in your right / left calf.  Hold this position for __________ seconds.  Repeat this exercise with a slight bend in your knee. Repeat __________ times. Complete this stretch __________ times per day.  STRENGTHENING EXERCISES - Peroneal Tendinitis   These exercises may help you when beginning to rehabilitate your injury. They may resolve your symptoms with or without further involvement from your physician, physical therapist or athletic trainer. While completing these exercises, remember:   Muscles can gain both the endurance and the strength needed for everyday activities through controlled exercises.  Complete these exercises as instructed by your physician, physical therapist or athletic trainer. Increase the resistance and repetitions only as guided by your caregiver. STRENGTH - Dorsiflexors  Secure a rubber exercise band or tubing to a fixed object (table, pole) and loop the other end around your right / left foot.  Sit on the floor facing the fixed object. The band should be slightly tense when your foot is relaxed.  Slowly draw your foot back toward you, using your ankle and toes.  Hold this position for __________ seconds. Slowly release the tension in the band and return your foot to the starting position. Repeat __________ times. Complete this exercise __________ times per day.  STRENGTH - Towel Curls  Sit in a chair, on a non-carpeted surface.  Place your foot on a towel, keeping your heel on the floor.  Pull the towel toward your heel only by curling your toes. Keep your heel on the floor.  If instructed by your physician, physical therapist or athletic trainer, add weight to the end of the towel. Repeat __________ times. Complete this exercise __________ times per day. STRENGTH - Ankle Eversion   Secure one end of a rubber exercise band or tubing to a fixed object (table, pole). Loop the other end around your foot, just before your toes.  Place your fists between your knees. This will focus your strengthening at your ankle.  Drawing the band across your opposite foot, away from the pole, slowly, pull your little toe out and up. Make sure the band is positioned to resist the entire motion.  Hold this position for  __________ seconds.  Have your muscles resist the band, as it slowly pulls your foot back to the starting position. Repeat __________ times. Complete this exercise __________ times per day.    This information is not intended to replace advice given to you by your health care provider. Make sure you discuss any questions you have with your health care provider.   Document Released: 11/15/2005 Document Revised: 04/01/2015 Document Reviewed: 02/27/2009 Elsevier Interactive Patient Education 2016 [employer]  

## 2016-02-04 NOTE — Progress Notes (Signed)
Patient ID: Rebecca Lewis, female   DOB: 1946/08/12, 70 y.o.   MRN: [id number]  Subjective: Rebecca Lewis is a 70 y.o. is seen today in office s/p right peroneal tendon repair preformed on 12/17/15. She presents  Today walking in a cam boot. She states that she still gets some pain on the surgical site that she has been some numbness the top of her foot. This is new. She has a states that she had some yellow drainage coming from the end of the incision last couple days but denies any swelling or redness. Denies any recent injury or trauma. No other complaints. Denies any systemic complaints such as fevers, chills, nausea, vomiting. No calf pain, chest pain, shortness of breath.   Objective: General: No acute distress, AAOx3  DP/PT pulses palpable 2/4, CRT < 3 sec to all digits.  Protective sensation intact. Motor function intact.  Right foot:   Presents today ambulating a cam boot. Dressing is removed. The incision does appear to be well coapted there is no opening or any drainage or pus expressed today. There is no edema, erythema, increase in warmth. No evidence of dehiscence. There is no surrounding erythema, ascending synovitis, fluctuance, crepitus. There is decreased tenderness on the surgical site. Denies any recent numbness the top of the foot subjectively however overall sensation appears to be intact. There is no other areas of tenderness bilateral lower extremities. No other open lesions or pre-ulcerative lesions.  No pain with calf compression, swelling, warmth, erythema.   Assessment and Plan:  Status post right peroneal tendon repair  -Treatment options discussed including all alternatives, risks, and complications -t tAhis time she can start to perform gentle range of motion, rehabilitation exercises. I discussed physical pair but she feels that she is not rates start this. We will start range of motion exercises at home until next appointment which I will refer her to physical therapy most  likely. Continuing CAM boot for now. She, next we can pick up a short cam boot if she light. I do within the numbness that she's having to her foot  Is coming from the boot and walking in the boot. -There is no drainage expressible incisions and is no signs of infection however given his history we will start Keflex just in case. -Follow-up in 2 weeks.  *PT next appointment; possible transition to shoe  Ovid CurdMatthew Maryjayne Kleven, DPM

## 2016-02-09 ENCOUNTER — Telehealth: Payer: Self-pay | Admitting: *Deleted

## 2016-02-09 NOTE — Telephone Encounter (Addendum)
Pt asked can she continue to wear the sneaker inside the house, and the boot outside, even if [facility] [place] or does she even need the 1/2 boot if she's wearing the sneakers in the house already.  I informed pt of Dr. Gabriel RungWagoner's orders to wear the sneaker with the ankle brace or stay in the boot.  Pt asked will Dr. Ardelle AntonWagoner bring [facility] and does he want her to wear the orthotic with the 1/2 boot.  Dr. Ardelle AntonWagoner states [facility] will [facility] and she doesn't need to wear the orthotics with it.  Informed pt.

## 2016-02-09 NOTE — Telephone Encounter (Signed)
I am OK if she starts to go to a shoe but with an ankle brace. Otherwise, stay in the boot.

## 2016-02-12 ENCOUNTER — Telehealth: Payer: Self-pay | Admitting: *Deleted

## 2016-02-12 NOTE — Telephone Encounter (Addendum)
Pt states she done the exercises once and iced, and then rested a day, but there was a little swelling, but wonders if you want her to continue the exercises if possibly still has a tear. She said you would possibly do another MRI.  02/13/2016-Informed pt of Dr. Gabriel RungWagoner's statement and recommendations.  Pt asked how many times daily to perform stretches, I told her to do at least in am and pm to keep areas loosened up.  Pt states understanding.

## 2016-02-12 NOTE — Telephone Encounter (Signed)
I did not say I was going to do another MRI and I do not think she still has a tear. She fell post-op and she believes she may have re torn the tendon. I would continue rehab exercises and ice.

## 2016-02-18 ENCOUNTER — Ambulatory Visit (INDEPENDENT_AMBULATORY_CARE_PROVIDER_SITE_OTHER): Payer: 59 | Admitting: Podiatry

## 2016-02-18 ENCOUNTER — Encounter: Payer: Self-pay | Admitting: Podiatry

## 2016-02-18 VITALS — BP 165/82 | HR 87 | Resp 18

## 2016-02-18 DIAGNOSIS — T148 Other injury of unspecified body region: Secondary | ICD-10-CM

## 2016-02-18 DIAGNOSIS — Z9889 Other specified postprocedural states: Secondary | ICD-10-CM

## 2016-02-18 DIAGNOSIS — T148XXA Other injury of unspecified body region, initial encounter: Secondary | ICD-10-CM

## 2016-02-18 NOTE — Progress Notes (Signed)
Patient ID: Rebecca Lewis, female   DOB: 04-07-1946, 70 y.o.   MRN: [id number]  Subjective: Rebecca DecampLinda Lewis is a 70 y.o. is seen today in office s/p right peroneal tendon repair preformed on 12/17/15. She presents today walking in a CAM boot. She states "I hope you are going to advance me today". He is doing well she's able to ambulate in a cam boot without any pain. She had 1 day Epson swelling along the surgical site but that did resolve quickly. No recent injury or trauma. No redness or warmth. No drainage or pus. No other complaints. Denies any systemic complaints such as fevers, chills, nausea, vomiting. No calf pain, chest pain, shortness of breath.   Objective: General: No acute distress, AAOx3; presents walking in CAM boot.  DP/PT pulses palpable 2/4, CRT < 3 sec to all digits.  Protective sensation intact. Motor function intact.  Right foot: The incision does appear to be well coapted and a scar has well formed. There is slight discomfort along the course of the surgical site is posterior lateral malleolus however is no specific pain. The peroneal tendons. Tendons appear to be intact. There is no overlying edema, erythema or increase in warmth.  There is no other areas of tenderness to bilateral lower extremities. No other open lesions or pre-ulcerative lesions.  No pain with calf compression, swelling, warmth, erythema.   Assessment and Plan:  Status post right peroneal tendon repair, with improved symptoms.  -Treatment options discussed including all alternatives, risks, and complications -At this time she started transition to weightbearing as tolerated in a regular shoe with ankle brace. Prescription was provided today for the brace. -She can start physical therapy. Prescription provided today (she will call [facility]) -She has her to drive as long she is not wearing the cam boot.  -Continue stretching, rehabilitation exercises at home.  -Follow-up in 4 weeks or sooner if any issues are to  arise. Call any questions concerns meantime.   Ovid CurdMatthew Wagoner, DPM

## 2016-02-25 ENCOUNTER — Ambulatory Visit: Payer: Self-pay | Admitting: Podiatry

## 2016-03-08 ENCOUNTER — Telehealth: Payer: Self-pay | Admitting: *Deleted

## 2016-03-08 DIAGNOSIS — Z9889 Other specified postprocedural states: Secondary | ICD-10-CM

## 2016-03-08 DIAGNOSIS — T148XXA Other injury of unspecified body region, initial encounter: Secondary | ICD-10-CM

## 2016-03-08 NOTE — Telephone Encounter (Addendum)
Pt states she would like to have her PT at [facility] extended 2 more weeks.  Dr. Bary CastillaWagoner okayed the extension. I faxed orders.

## 2016-03-17 ENCOUNTER — Ambulatory Visit (INDEPENDENT_AMBULATORY_CARE_PROVIDER_SITE_OTHER): Payer: 59 | Admitting: Podiatry

## 2016-03-17 ENCOUNTER — Ambulatory Visit (HOSPITAL_BASED_OUTPATIENT_CLINIC_OR_DEPARTMENT_OTHER)
Admission: RE | Admit: 2016-03-17 | Discharge: 2016-03-17 | Disposition: A | Discharge status: Home / Self Care | Payer: 59 | Source: Ambulatory Visit | Attending: Podiatry | Admitting: Podiatry

## 2016-03-17 ENCOUNTER — Encounter: Payer: Self-pay | Admitting: Podiatry

## 2016-03-17 VITALS — BP 148/76 | HR 69 | Resp 18

## 2016-03-17 DIAGNOSIS — S93401A Sprain of unspecified ligament of right ankle, initial encounter: Secondary | ICD-10-CM

## 2016-03-17 DIAGNOSIS — M25571 Pain in right ankle and joints of right foot: Secondary | ICD-10-CM | POA: Diagnosis not present

## 2016-03-17 DIAGNOSIS — Z9889 Other specified postprocedural states: Secondary | ICD-10-CM

## 2016-03-18 NOTE — Progress Notes (Signed)
Patient ID: Rebecca Lewis, female   DOB: 09-Aug-1946, 70 y.o.   MRN: [id number]  Subjective: Rebecca Lewis is a 70 y.o. is seen today in office s/p right peroneal tendon repair preformed on 12/17/15. She presents today wearing a regular shoe. She said that she was doing well however last week at physical therapy she was on a board and she rolled her ankle. She states that she is started to have recurrence of pain in the outside part of her ankle since this injury. She is still able to wear regular shoe although it is sore to walk for long periods of time. No other complaints. Denies any systemic complaints such as fevers, chills, nausea, vomiting. No calf pain, chest pain, shortness of breath.   Objective: General: No acute distress, AAOx3; presents walking in CAM boot.  DP/PT pulses palpable 2/4, CRT < 3 sec to all digits.  Protective sensation intact. Motor function intact.  Right foot: The incision does appear to be well coapted and a scar has well formed. There is mild increase in pain on the posterior portion of the lateral malleolus along the course of the peroneal tendon compared to last appointment. There is also tenderness along the course the ATFL. No area pinpoint bony tenderness. There is no pain with ankle or subtalar joint range of motion. There is no other areas of tenderness to bilateral lower extremities. No other open lesions or pre-ulcerative lesions.  No pain with calf compression, swelling, warmth, erythema.   Assessment and Plan:  Status post right peroneal tendon repair,; with acute ankle sprain  -Treatment options discussed including all alternatives, risks, and complications -X-rays ordered. -Recommended a back into ankle brace, rest, ice. He will often physical therapy or couple days. As the pain decreases she developed a physical therapy. -Follow-up in 3 weeks or sooner if any issues are to arise. Call any questions concerns meantime.   Ovid CurdMatthew Wagoner, DPM

## 2016-04-07 ENCOUNTER — Encounter: Payer: Self-pay | Admitting: Podiatry

## 2016-04-07 ENCOUNTER — Ambulatory Visit (INDEPENDENT_AMBULATORY_CARE_PROVIDER_SITE_OTHER): Payer: 59 | Admitting: Podiatry

## 2016-04-07 VITALS — BP 144/77 | HR 91 | Resp 18

## 2016-04-07 DIAGNOSIS — T148 Other injury of unspecified body region: Secondary | ICD-10-CM | POA: Diagnosis not present

## 2016-04-07 DIAGNOSIS — S93401A Sprain of unspecified ligament of right ankle, initial encounter: Secondary | ICD-10-CM | POA: Diagnosis not present

## 2016-04-07 DIAGNOSIS — T148XXA Other injury of unspecified body region, initial encounter: Secondary | ICD-10-CM

## 2016-04-08 DIAGNOSIS — Z9889 Other specified postprocedural states: Secondary | ICD-10-CM | POA: Insufficient documentation

## 2016-04-08 DIAGNOSIS — S93409A Sprain of unspecified ligament of unspecified ankle, initial encounter: Secondary | ICD-10-CM | POA: Insufficient documentation

## 2016-04-08 NOTE — Progress Notes (Signed)
Patient ID: Rebecca Lewis, female   DOB: 06/14/1946, 70 y.o.   MRN: [id number]  Subjective: Rebecca Lewis is a 70 y.o. is seen today in office s/p right peroneal tendon repair preformed on 12/17/15. She says overall she is doing better. She still gets some discomfort on the surgical site however this has improved. She is also continue with physical therapy but she is decreased but she has been doing. She does continue wear regular shoe. She has not required ankle brace. She is able to perform daily activities. There is a minimal swelling to the surgical site. No redness or warmth. No other complaints. Denies any systemic complaints such as fevers, chills, nausea, vomiting. No calf pain, chest pain, shortness of breath.   Objective: General: No acute distress, AAOx3; presents walking in CAM boot.  DP/PT pulses palpable 2/4, CRT < 3 sec to all digits.  Protective sensation intact. Motor function intact.  Right foot: The incision does appear to be well coapted and a scar has well formed. There is mild discomfort on the posterior portion of the lateral malleolus along the peroneal tendon on the surgical site. Tendon appears to be intact. The tendon appears to have no discomfort. There is very minimal edema on the surgical site and there is no associated erythema or increase in warmth. No other areas of tenderness to bilateral lower extremities. There is no pain to the ankle this time or the ankle ligmaments. 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 No pain with calf compression, swelling, warmth, erythema.   Assessment and Plan:  Status post right peroneal tendon repair  -Treatment options discussed including all alternatives, risks, and complications -Given to continue with supportive shoe gear. Ankle brace as needed to try to wean off. Continue physical therapy. Discussed aquatic therapy and iontophoresis. She'll discuss this with her physical therapist on Friday. Elevation and ice. -Follow up with me as  scheduled.  Ovid CurdMatthew Amrom Ore, DPM

## 2016-04-09 ENCOUNTER — Telehealth: Payer: Self-pay | Admitting: *Deleted

## 2016-04-09 NOTE — Telephone Encounter (Addendum)
Pt states [facility] [phone], needs a Dexamethasone cream for iontophoresis.  Left message at [facility] requesting the formula for the Dexamethasone their office uses, left my phone and fax numbers.  04/12/2016-Monica - [facility] states the referring doctor writes for the Dexamethasone cream for Iontophoresis, not the physical therapist.  I called and spoke with the receptionist at [facility], she states they have had only one other pt to have Iontophoresis and she believe they [facility] their [facility] [phone]. I explained to the receptionist that Physical Therapist often has a formula preference for the dexamethasone used in Iontophoresis, but she would not check with the Physical therapist.  I spoke with Fabio BeringLina - [facility] states they do not have and are not able to order.  Left message for pt to call or pick up the rx or call with the name of a pharmacy that carried the solution, or could use the [facility] location.  Pt called again for the name of the solution for PT. Left message informing pt of the Dexamethasone 0.4% in 16oz solution for PT with Iontophoresis, and that I would call to the [facility] if she would like.  04/13/2016-Pt states the [facility] near her home has Dexamethasone 0.5% liquid, is that okay.  Dr. Ardelle AntonWagoner states may use Dexamethasone 0.5% liquid. [facility] and to pt. 04/14/2016- Pt [facility] [place].  I reviewed the order for Dexamethasone 0.5% and it was printed rather than [facility].  I [facility] the Rx [facility]'s [facility] [phone] and left message for pt apologizing for the delay.  04/15/2016-Debbie - [facility] [place] states she is not able to fill the Dexamethasone called in yesterday, but she contacted [facility] and they can compound for pt.  I called Dexamethasone 0.4% solution rx to [facility].  Left message informing pt of rx called to [facility] and to expect a call concerning  the compound from them.  Faxed pt's insurance information and demographics to [facility].

## 2016-04-12 MED ORDER — NONFORMULARY OR COMPOUNDED ITEM: Status: DC

## 2016-04-13 MED ORDER — NONFORMULARY OR COMPOUNDED ITEM: Status: DC

## 2016-04-23 DIAGNOSIS — M8580 Other specified disorders of bone density and structure, unspecified site: Secondary | ICD-10-CM | POA: Insufficient documentation

## 2016-05-12 ENCOUNTER — Encounter: Payer: Self-pay | Admitting: Podiatry

## 2016-05-12 ENCOUNTER — Ambulatory Visit (INDEPENDENT_AMBULATORY_CARE_PROVIDER_SITE_OTHER): Payer: 59 | Admitting: Podiatry

## 2016-05-12 DIAGNOSIS — T148XXA Other injury of unspecified body region, initial encounter: Secondary | ICD-10-CM

## 2016-05-12 DIAGNOSIS — T148 Other injury of unspecified body region: Secondary | ICD-10-CM

## 2016-05-13 NOTE — Progress Notes (Signed)
Patient ID: Rebecca DecampLinda Lewis, female   DOB: [birth year], 70 y.o.   MRN: [id number]  Subjective: Rebecca DecampLinda Lewis is a 70 y.o. is seen today in office s/p right peroneal tendon repair preformed on 12/17/15. She is continuing physical therapy and she feels that she is making good progress. She's not having pain on the surgical site however she is still getting some pain recently just above the incision on the also part of her ankle. She did recently get new shoes which should help quite a bit and she is continuing the inserts. She is able to perform daily activities with minimal pain. Swelling has improved. No redness or warmth. No other complaints. Denies any systemic complaints such as fevers, chills, nausea, vomiting. No calf pain, chest pain, shortness of breath.   Objective: General: No acute distress, AAOx3; presents walking in CAM boot.  DP/PT pulses palpable 2/4, CRT < 3 sec to all digits.  Protective sensation intact. Motor function intact.  Right foot: The incision does appear to be well coapted and a scar has well formed. There is no discomfort on the surgical site on the scar how there is some mild discomfort along the peroneal tendons just superior to the incision. The tendons appear to be intact. There is minimal edema to this area for any erythema or increase in warmth.  No other areas of tenderness to bilateral lower extremities. There is no pain to the ankle this time or the ankle ligmaments. 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 No pain with calf compression, swelling, warmth, erythema.   Assessment and Plan:  Status post right peroneal tendon repair, improving  -Treatment options discussed including all alternatives, risks, and complications -Recommended continued orthotics and supportive shoes. She did purchase new shoes which are fitting much better and she feels much better wearing them. Continue physical therapy for now. She'll be going out of town for the next month or so. Also follow-up  with her which she comes back. Encouraged to call any questions or concerns. -Follow up with me as scheduled.  Ovid CurdMatthew Aviv Rota, DPM

## 2016-07-07 ENCOUNTER — Ambulatory Visit (INDEPENDENT_AMBULATORY_CARE_PROVIDER_SITE_OTHER): Payer: 59 | Admitting: Podiatry

## 2016-07-07 ENCOUNTER — Encounter: Payer: Self-pay | Admitting: Podiatry

## 2016-07-07 DIAGNOSIS — M779 Enthesopathy, unspecified: Secondary | ICD-10-CM | POA: Diagnosis not present

## 2016-07-07 DIAGNOSIS — M79671 Pain in right foot: Secondary | ICD-10-CM | POA: Diagnosis not present

## 2016-07-08 NOTE — Progress Notes (Signed)
Subjective: 70 year old female presents the office today for concerns of pain in the outside aspect of her ankle. She states that she returns from vacation and she started increasing her exercises she was doing weights and she increase her activity quickly including multiple squats with weights and other exercises. She said that she was squatting one day with weights and she felt increased pain the outside aspect of the right ankle and the surgical sites. She denies rolling her ankle or any specific injury. Denies any increase in swelling or redness. Denies any systemic complaints such as fevers, chills, nausea, vomiting. No acute changes since last appointment, and no other complaints at this time.   Objective: AAO x3, NAD DP/PT pulses palpable bilaterally, CRT less than 3 seconds Scars from prior surgeries are well healed. There is very minimal tenderness palpation along the course of the peroneal tendon however the pain now is somewhat more distal to where the incision is at the prior surgery. There is no area pinpoint tenderness or pain the vibratory sensation. There is no overlying edema, erythema, increase in warmth. No other areas of tenderness bilaterally.  No open lesions or pre-ulcerative lesions. There is no pain with calf compression, swelling, warmth, erythema.  Assessment: Right foot tendinitis  Plan: -Treatment options discussed including all alternatives, risks, and complications -At this time she wishes to hold off any further x-ray -I believe that she stretched out the tendon causing inflammation however do not think that she re-tore the tendon. Recommend her were her ankle brace and to decrease her activity and hold off any weightbearing exercises to the lower extremity this time. She should slowly increase her walking and do exercises without weights. Ice to the area. -Patient encouraged to call the office with any questions, concerns, change in symptoms.   Ovid CurdMatthew Abbagale Goguen,  DPM

## 2016-08-05 NOTE — Progress Notes (Signed)
DOS 01.18.2017 Right ankle repair of peroneal tendons, trim of callus

## 2016-09-07 ENCOUNTER — Ambulatory Visit (INDEPENDENT_AMBULATORY_CARE_PROVIDER_SITE_OTHER): Payer: 59 | Admitting: Podiatry

## 2016-09-07 ENCOUNTER — Encounter: Payer: Self-pay | Admitting: Podiatry

## 2016-09-07 DIAGNOSIS — Q828 Other specified congenital malformations of skin: Secondary | ICD-10-CM | POA: Diagnosis not present

## 2016-09-07 NOTE — Progress Notes (Signed)
Subjective: Patient presents to the office they for concerns of a wart to the bottom of the right heel which is been ongoing the last couple weeks. She is the area is somewhat tender with pressure in shoe gear. Denies any foreign objects. Denies any swelling or redness or drainage. She said no recent treatment for this. Denies any systemic complaints such as fevers, chills, nausea, vomiting. No acute changes since last appointment, and no other complaints at this time.   Objective: AAO x3, NAD DP/PT pulses palpable bilaterally, CRT less than 3 seconds On the plantar aspect of the right heel is an annular hyperkeratotic lesion. Upon debridement there is no pinpoint bleeding or evidence of verruca. This appears to be a deep punctate annular lesion consistent with a porokeratosis. No other lesions identified. There is no evidence of foreign body. No open lesions or pre-ulcerative lesions.  No pain with calf compression, swelling, warmth, erythema  Assessment: Right heel porokeratosis  Plan: -All treatment options discussed with the patient including all alternatives, risks, complications.  -Lesion was debrided to without, occasions or bleeding. The areas clean. A pad was placed. Salinocaine was applied followed by a bandage. Post procedure instructions were discussed. Monitor for infection. -Follow-up in 3-4 recent symptoms continue or sooner if any issues are to arise. -Patient encouraged to call the office with any questions, concerns, change in symptoms.   Ovid CurdMatthew Sabreen Kitchen, DPM

## 2016-10-14 DIAGNOSIS — M533 Sacrococcygeal disorders, not elsewhere classified: Secondary | ICD-10-CM | POA: Insufficient documentation

## 2017-01-14 DIAGNOSIS — M51369 Other intervertebral disc degeneration, 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 mention of lumbar back pain or lower extremity pain: Secondary | ICD-10-CM | POA: Insufficient documentation

## 2017-01-14 DIAGNOSIS — M5136 Other intervertebral disc degeneration, lumbar region: Secondary | ICD-10-CM | POA: Insufficient documentation

## 2017-01-14 DIAGNOSIS — M47816 Spondylosis without myelopathy or radiculopathy, lumbar region: Secondary | ICD-10-CM | POA: Insufficient documentation

## 2017-05-10 DIAGNOSIS — M24444 Recurrent dislocation, right finger: Secondary | ICD-10-CM | POA: Insufficient documentation

## 2017-08-11 ENCOUNTER — Encounter: Payer: Self-pay | Admitting: Podiatry

## 2017-08-11 ENCOUNTER — Ambulatory Visit (INDEPENDENT_AMBULATORY_CARE_PROVIDER_SITE_OTHER): Admitting: Podiatry

## 2017-08-11 DIAGNOSIS — Q828 Other specified congenital malformations of skin: Secondary | ICD-10-CM

## 2017-08-11 DIAGNOSIS — M779 Enthesopathy, unspecified: Secondary | ICD-10-CM

## 2017-08-11 DIAGNOSIS — M722 Plantar fascial fibromatosis: Secondary | ICD-10-CM

## 2017-08-11 DIAGNOSIS — M216X9 Other acquired deformities of unspecified foot: Secondary | ICD-10-CM

## 2017-08-12 NOTE — Progress Notes (Signed)
Subjective: Karalyne presents the also requesting new orthotics. She states that she occasionally gets some tenderness the also asked of the ankle on the right side she does a lot of exercising or walking but overall she is doing better and she's having no pain today. She does continue to walk 3 miles a day and afterwards she will do some exercises to help stretch. She has no pain with her walker. She has no severe arthritis on the top of her right foot is Somewhat Worse but Denies Any Severe Pain. Chest Has Calluses to Both of Her Heels That Should Have Trimmed Today. Denies any systemic complaints such as fevers, chills, nausea, vomiting. No acute changes since last appointment, and no other complaints at this time.   Objective: AAO x3, NAD DP/PT pulses palpable bilaterally, CRT less than 3 seconds Incision from the prior surgeries well-healed. There is minimal discomfort along the superior portion of the peroneal tendon just  posterior to the fibula. Tendon appears to be intact. Cavus foot deformities present. 2 areas of bony exostosis palpable the dorsal aspect of the right foot however there is no pain with either is no erythema or skin breakdown or swelling. Small hyperkeratotic lesions bilateral plantar heel. Mild tenderness to palpation this area and there is no underlying ulceration, drainage or any signs of infection present. At this time there is no pain on the course or insertion of the plantar fascia. She states that if she does not wear orthotics she does start to get pain to her heel. pen lesions or pre-ulcerative lesions.  No pain with calf compression, swelling, warmth, erythema  Assessment: History of tendon tear, plantar fasciitis; cavus foot type; porokeratosis   Plan: -All treatment options discussed with the patient including all alternatives, risks, complications.  -At today's appointment's were made of her feet for inserts. We will do the same prescription is previous but will  at a neuroma pad at her request the right side as she occasionally gets discomfort that area but not currently.  -Hyperkeratotic lesions were sharply debrided 2 without complications or bleeding  -Follow-up in 3 weeks to get up orthotics or sooner if any issues are to arise.  -Patient encouraged to call the office with any questions, concerns, change in symptoms.   Ovid Curd, DPM

## 2017-09-01 ENCOUNTER — Ambulatory Visit: Payer: 59 | Admitting: Orthotics

## 2017-09-01 DIAGNOSIS — M216X9 Other acquired deformities of unspecified foot: Secondary | ICD-10-CM

## 2017-09-01 DIAGNOSIS — M779 Enthesopathy, unspecified: Secondary | ICD-10-CM

## 2017-09-01 NOTE — Progress Notes (Signed)
Patient came in today to pick up custom made foot orthotics.  The goals were accomplished and the patient reported no dissatisfaction with said orthotics.  Patient was advised of breakin period and how to report any issues. 

## 2017-09-15 ENCOUNTER — Other Ambulatory Visit: Payer: Self-pay | Admitting: Orthotics

## 2017-09-22 ENCOUNTER — Other Ambulatory Visit: Payer: Self-pay | Admitting: Orthotics

## 2017-09-26 ENCOUNTER — Ambulatory Visit: Payer: 59 | Admitting: Orthotics

## 2017-09-26 DIAGNOSIS — M779 Enthesopathy, unspecified: Secondary | ICD-10-CM

## 2017-09-26 DIAGNOSIS — M79671 Pain in right foot: Secondary | ICD-10-CM

## 2017-09-26 DIAGNOSIS — M216X9 Other acquired deformities of unspecified foot: Secondary | ICD-10-CM

## 2017-09-26 DIAGNOSIS — Q828 Other specified congenital malformations of skin: Secondary | ICD-10-CM

## 2017-09-26 NOTE — Progress Notes (Signed)
Patient came in for an adjustment to f/o. Added 2nd met R PPT offload.

## 2017-10-10 ENCOUNTER — Ambulatory Visit (INDEPENDENT_AMBULATORY_CARE_PROVIDER_SITE_OTHER): Admitting: Podiatry

## 2017-10-10 DIAGNOSIS — M779 Enthesopathy, unspecified: Secondary | ICD-10-CM

## 2017-10-10 DIAGNOSIS — M216X1 Other acquired deformities of right foot: Secondary | ICD-10-CM | POA: Diagnosis not present

## 2017-10-12 NOTE — Progress Notes (Signed)
Subjective: Bonita QuinLinda presents the office today for concerns of pain to the bottom of her right foot she points to submetatarsal to area where she gets the majority of tenderness.  This is been ongoing for months and has been intermittent describes a sharp pain at times.  Next the pain level is 5/10.  She has been taking meloxicam.  She denies any recent injury or trauma.  She feels like the orthotics have been making the area worse.  She denies any recent treatment.  She has no other concerns. Denies any systemic complaints such as fevers, chills, nausea, vomiting. No acute changes since last appointment, and no other complaints at this time.   Objective: AAO x3, NAD DP/PT pulses palpable bilaterally, CRT less than 3 seconds On the right foot some metatarsal shows mild tenderness to palpation.  There is prominence of metatarsal heads plantarly with atrophy of the fat pad.  There is no area of pinpoint bony tenderness or pain to vibratory sensation.  He was foot type is present.  There is minimal discomfort in the peroneal tendon.  There is no area pinpoint tenderness there is no other area of tenderness identified at this time. Hammertoes present No open lesions or pre-ulcerative lesions.  No pain with calf compression, swelling, warmth, erythema  Assessment: Capsulitis right second MTPJ; prominent metatarsal head  Plan: -All treatment options discussed with the patient including all alternatives, risks, complications.  -I had a quick evaluate her to modify the inserts to help take pressure off the submetatarsal area.  I think modified her inserts will help quite a bit.  There is no swelling or signs of fracture there is no signs of infection.  Offloading pads were dispensed as well. -Patient encouraged to call the office with any questions, concerns, change in symptoms.   Vivi BarrackMatthew R Elianny Buxbaum DPM

## 2017-10-24 ENCOUNTER — Encounter: Payer: Self-pay | Admitting: Orthotics

## 2017-11-02 ENCOUNTER — Ambulatory Visit: Admitting: Orthotics

## 2017-11-02 DIAGNOSIS — M779 Enthesopathy, unspecified: Secondary | ICD-10-CM

## 2017-11-02 NOTE — Progress Notes (Signed)
Picked up right redone f/o (increased arch height); she seems pleased with it.

## 2017-11-07 ENCOUNTER — Other Ambulatory Visit: Payer: Self-pay | Admitting: Orthotics

## 2017-11-23 ENCOUNTER — Other Ambulatory Visit: Payer: 59 | Admitting: Orthotics

## 2017-12-05 ENCOUNTER — Ambulatory Visit (INDEPENDENT_AMBULATORY_CARE_PROVIDER_SITE_OTHER): Payer: 59 | Admitting: Orthotics

## 2017-12-05 DIAGNOSIS — M779 Enthesopathy, unspecified: Secondary | ICD-10-CM

## 2017-12-05 NOTE — Progress Notes (Signed)
Redo foot orthotics...offload 1st met, decrease arch, add met pad, scaphoid pad.

## 2017-12-19 ENCOUNTER — Other Ambulatory Visit: Payer: Self-pay | Admitting: Orthotics

## 2017-12-21 ENCOUNTER — Ambulatory Visit: Payer: 59 | Admitting: Orthotics

## 2017-12-21 DIAGNOSIS — M779 Enthesopathy, unspecified: Secondary | ICD-10-CM

## 2017-12-21 NOTE — Progress Notes (Signed)
Patient seemed to like the redone f/o..will return for adjustments if given any discomfort

## 2017-12-30 DIAGNOSIS — G4452 New daily persistent headache (NDPH): Secondary | ICD-10-CM | POA: Insufficient documentation

## 2017-12-30 DIAGNOSIS — R110 Nausea: Secondary | ICD-10-CM | POA: Insufficient documentation

## 2018-01-26 ENCOUNTER — Ambulatory Visit (INDEPENDENT_AMBULATORY_CARE_PROVIDER_SITE_OTHER): Payer: 59

## 2018-01-26 ENCOUNTER — Ambulatory Visit: Payer: 59 | Admitting: Podiatry

## 2018-01-26 DIAGNOSIS — M779 Enthesopathy, unspecified: Secondary | ICD-10-CM

## 2018-01-26 DIAGNOSIS — T148XXA Other injury of unspecified body region, initial encounter: Secondary | ICD-10-CM | POA: Diagnosis not present

## 2018-01-26 NOTE — Progress Notes (Signed)
Subjective: Rebecca Lewis presents the office today for concerns of left ankle pain which is been ongoing for the last couple of months.  She states that she has quite a bit of pain to the office of the ankle with walking and ambulation.  She has minimal pain today at rest.  She is try to modify the orthotics as well as resting it but she continues to get symptoms.  She does have a history of a fall but not recently.  She also states that she still gets some pain to the outside aspect of the ankle and away from the surgical site but more distal she points to. Denies any systemic complaints such as fevers, chills, nausea, vomiting. No acute changes since last appointment, and no other complaints at this time.   Objective: AAO x3, NAD DP/PT pulses palpable bilaterally, CRT less than 3 seconds Only concern is her left ankle there is tenderness subjectively in the course of the peroneal tendon just posterior and inferior to the lateral malleolus and just proximal to the fifth metatarsal insertion.  There is no pain today and there is no swelling.  Pain with eversion.  Mild discomfort still in the right ankle and this is along the peroneal tendon inferior to the area of the prior repair.  No significant swelling there is no erythema or increase in warmth.  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  No other areas of tenderness. No open lesions or pre-ulcerative lesions.  No pain with calf compression, swelling, warmth, erythema  Assessment: Continued right peroneal tendinitis with new symptoms on the left peroneal tendon  Plan: -All treatment options discussed with the patient including all alternatives, risks, complications.  -Given that her symptoms have been ongoing for the last couple months I recommended an MRI of the left ankle to rule out a peroneal tendon tear.  This is for surgical planning. -Regards to the right ankle will be to continue with therapy as well as bracing if needed and orthotics.  We will  get the MRI of the left ankle as this is her primary concern in which case majority of symptoms currently. -Patient encouraged to call the office with any questions, concerns, change in symptoms.   Rebecca Lewis DPM

## 2018-01-27 DIAGNOSIS — R7303 Prediabetes: Secondary | ICD-10-CM | POA: Insufficient documentation

## 2018-01-30 ENCOUNTER — Telehealth: Payer: Self-pay | Admitting: Podiatry

## 2018-01-30 ENCOUNTER — Telehealth: Payer: Self-pay | Admitting: *Deleted

## 2018-01-30 DIAGNOSIS — T148XXA Other injury of unspecified body region, initial encounter: Secondary | ICD-10-CM

## 2018-01-30 MED ORDER — DICLOFENAC SODIUM 1 % TD GEL: TRANSDERMAL | 5 refills | Status: DC

## 2018-01-30 NOTE — Telephone Encounter (Signed)
Left message informing pt the Voltaren gel had been [facility] [id number], and she could [facility] to schedule the MRI, to schedule 5-7 days from today's date to allow for pre-cert.

## 2018-01-30 NOTE — Telephone Encounter (Signed)
I feel like Dr. Ardelle AntonWagoner may have forgotten about me which he has never done before. I called my pharmacy over the weekend to see if he had called in my prescription for the Voltaren Gel and they had nothing, so I called back Sunday and they had nothing. Anyway, he said he was going to order that. Also, as a reminder if he forgot that, he might have forgotten about ordering I guess an MRI on the left foot a possible tear there. Anyway, just give me a call and tell him I don't forget him. You can call me back at [phone] and if you miss me you can just leave a message. Thanks. Bye bye.

## 2018-01-30 NOTE — Addendum Note (Signed)
Addended by: Alphia Kava'CONNELL, VALERY D on: 01/30/2018 12:10 PM   Modules accepted: Orders

## 2018-01-30 NOTE — Telephone Encounter (Signed)
Orders to J. Quintana, RN for pre-cert and faxed to [facility]. 

## 2018-01-30 NOTE — Telephone Encounter (Signed)
-----   Message from Vivi BarrackMatthew R Wagoner, DPM sent at 01/30/2018  7:09 AM EST ----- Can you please order an MRI of the left ankle to rule out a peroneal tendon tear?  Thank you

## 2018-02-02 ENCOUNTER — Telehealth: Payer: Self-pay | Admitting: *Deleted

## 2018-02-02 NOTE — Telephone Encounter (Signed)
"  I'm calling you about a patient that is on our schedule for Tuesday, Silverio DecampLinda Warrior.  She has [employer] and since she doesn't have Medicare Primary, the representative said that this will require authorization and will go through review.  I just wanted to let you know because this one is going to take a little more work than the regular ones.  If you have any questions give me a call."

## 2018-02-03 NOTE — Telephone Encounter (Signed)
Clifton JamesJessica Quintana took care of this.  She was informed that authorization was not needed.

## 2018-02-06 ENCOUNTER — Telehealth: Payer: Self-pay | Admitting: *Deleted

## 2018-02-06 NOTE — Telephone Encounter (Signed)
Spoke to StauntonJessica about this. She is going to go ahead and do the right side as well.

## 2018-02-06 NOTE — Telephone Encounter (Signed)
Pt states she would like to switch from the left ankle MRI to have the right performed tomorrow. I spoke with pt and informed that Dr. Ardelle AntonWagoner would need to decide if she change to the right. Pt stated she would like to have both ankles MRIs.

## 2018-02-07 ENCOUNTER — Other Ambulatory Visit: Payer: Self-pay

## 2018-02-07 DIAGNOSIS — T148XXA Other injury of unspecified body region, initial encounter: Secondary | ICD-10-CM

## 2018-02-13 ENCOUNTER — Ambulatory Visit (INDEPENDENT_AMBULATORY_CARE_PROVIDER_SITE_OTHER): Admitting: Podiatry

## 2018-02-13 ENCOUNTER — Ambulatory Visit: Payer: 59 | Admitting: Orthotics

## 2018-02-13 DIAGNOSIS — T148XXA Other injury of unspecified body region, initial encounter: Secondary | ICD-10-CM

## 2018-02-13 DIAGNOSIS — M216X9 Other acquired deformities of unspecified foot: Secondary | ICD-10-CM

## 2018-02-13 DIAGNOSIS — M216X1 Other acquired deformities of right foot: Secondary | ICD-10-CM

## 2018-02-13 NOTE — Progress Notes (Signed)
Patient will bring f/o back to be refurbished.

## 2018-02-13 NOTE — Progress Notes (Signed)
Subjective: Presents the office today for follow-up evaluation of bilateral ankle pain.  She is in the left that is getting better but is still tender.  Majority of her symptoms at the right foot that she complains of today.Rebecca Lewis states that his pain the also that the right ankle.  She wants to continue to walk as much as possible but she was diagnosed callus formation in the submetatarsal one as well as 5.  She denies any open sores or redness or drainage or any swelling.  She has no new concerns.  No recent injury. Denies any systemic complaints such as fevers, chills, nausea, vomiting. No acute changes since last appointment, and no other complaints at this time.   Objective: AAO x3, NAD DP/PT pulses palpable bilaterally, CRT less than 3 seconds There is still some mild tenderness along the course of the peroneal tendon just posterior and inferior to lateral malleolus on the left side. The peroneal tendon appears intact. There is no overlying edema, erythema, increase in warmth.  On the right foot there is still tenderness in the course of the peroneal tendon but this is inferior to the area the previous repair and along the insertion into the fifth metatarsal base.  Tendon appears to be intact overall.  The scar from the prior surgery is well-healed.  Minimal hyperkeratotic tissue submetatarsal 1.  There is no edema, erythema, increase in warmth.  There is plantarflexion of the first ray.  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  Hammertoes are present. No open lesions or pre-ulcerative lesions.  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   Plan: -All treatment options discussed with the patient including all alternatives, risks, complications.  -At this point a long discussion regards to treatment options.  We are still awaiting the MRI.  She has a lot of different symptoms to her feet.  Discussed  that we cannot surgery going to fix everything I did a major reconstruction.  For that I do not think that doing a reconstruction for her at this point would be advantageous.  We discussed further treatment options await the MRI results first.  There is no tearing of the tendon continue with conservative treatment and go to physical therapy but there is a tear of the tendon more distal along the peroneal tendon then will consider surgical intervention.  Long-term on her continued orthotics and shoe modifications.  She states that she did purchase new shoe and this is been helping she can tell where she wears out and and she did not purchase that she would in some time. -Patient encouraged to call the office with any questions, concerns, change in symptoms.   Vivi BarrackMatthew R Wagoner DPM

## 2018-02-21 ENCOUNTER — Ambulatory Visit: Payer: 59 | Admitting: Orthotics

## 2018-02-21 DIAGNOSIS — M216X9 Other acquired deformities of unspecified foot: Secondary | ICD-10-CM

## 2018-02-21 NOTE — Progress Notes (Signed)
patietn came in today to get f/o adjusted; removed dancers pad and added 1st met head k-wedge.  Liliane Channel

## 2018-02-24 ENCOUNTER — Ambulatory Visit
Admission: RE | Admit: 2018-02-24 | Discharge: 2018-02-24 | Disposition: A | Discharge status: Home / Self Care | Payer: 59 | Source: Ambulatory Visit | Attending: Podiatry | Admitting: Podiatry

## 2018-02-24 DIAGNOSIS — T148XXA Other injury of unspecified body region, initial encounter: Secondary | ICD-10-CM

## 2018-02-27 ENCOUNTER — Telehealth: Payer: Self-pay | Admitting: *Deleted

## 2018-02-27 ENCOUNTER — Ambulatory Visit: Payer: 59 | Admitting: Orthotics

## 2018-02-27 ENCOUNTER — Encounter: Payer: Self-pay | Admitting: Podiatry

## 2018-02-27 DIAGNOSIS — T148XXA Other injury of unspecified body region, initial encounter: Secondary | ICD-10-CM

## 2018-02-27 DIAGNOSIS — M216X1 Other acquired deformities of right foot: Secondary | ICD-10-CM

## 2018-02-27 DIAGNOSIS — M216X9 Other acquired deformities of unspecified foot: Secondary | ICD-10-CM

## 2018-02-27 NOTE — Telephone Encounter (Signed)
Dr. Wagoner ordered PT [facility] - In-office. 

## 2018-02-27 NOTE — Progress Notes (Signed)
Made dancer's pad PPT and glue 1/2 thin zote cover to protect the f/o addition while she is in [place]

## 2018-02-27 NOTE — Progress Notes (Signed)
Patient presents today to to see Rebecca Lewis for orthotics however I wanted to speak with her in regards to the MRI. Discussed MRI findings with her and it does show a small tear of bilateral peroneal tendons and tendinosis. We will start PT. Discussed surgery as well however will try PT before proceed with further surgery.   Rx written for [facility].   I will see her back in 1 month but she is aware to call if there are any questions/concerns/changes.   Rebecca Lewis

## 2018-03-13 ENCOUNTER — Ambulatory Visit: Payer: 59 | Admitting: Orthotics

## 2018-03-13 ENCOUNTER — Ambulatory Visit: Payer: Self-pay | Admitting: Podiatry

## 2018-03-13 DIAGNOSIS — M216X1 Other acquired deformities of right foot: Secondary | ICD-10-CM

## 2018-03-13 DIAGNOSIS — Z9889 Other specified postprocedural states: Secondary | ICD-10-CM

## 2018-03-13 NOTE — Progress Notes (Signed)
Adjusted f/o by adding k-wedge under 1/2/3 IPJ Right.

## 2018-03-23 ENCOUNTER — Ambulatory Visit: Admitting: Orthotics

## 2018-03-23 DIAGNOSIS — Z9889 Other specified postprocedural states: Secondary | ICD-10-CM

## 2018-03-23 NOTE — Progress Notes (Signed)
Patient will bring f/o back for adjustments on Monday, needs valgus ff wedge right and change topcover left.

## 2018-03-27 ENCOUNTER — Ambulatory Visit: Payer: 59 | Admitting: Orthotics

## 2018-03-27 DIAGNOSIS — M216X1 Other acquired deformities of right foot: Secondary | ICD-10-CM

## 2018-03-27 DIAGNOSIS — Z9889 Other specified postprocedural states: Secondary | ICD-10-CM

## 2018-03-27 DIAGNOSIS — M216X9 Other acquired deformities of unspecified foot: Secondary | ICD-10-CM

## 2018-03-27 DIAGNOSIS — M79671 Pain in right foot: Secondary | ICD-10-CM

## 2018-03-27 NOTE — Progress Notes (Signed)
Added RF valgus wedge to RIGHT, recovered LEFT with PPT.

## 2018-04-03 ENCOUNTER — Ambulatory Visit: Payer: 59 | Admitting: Podiatry

## 2018-04-03 ENCOUNTER — Telehealth: Payer: Self-pay | Admitting: *Deleted

## 2018-04-03 DIAGNOSIS — T148XXA Other injury of unspecified body region, initial encounter: Secondary | ICD-10-CM

## 2018-04-03 DIAGNOSIS — M216X9 Other acquired deformities of unspecified foot: Secondary | ICD-10-CM | POA: Diagnosis not present

## 2018-04-03 NOTE — Telephone Encounter (Signed)
"  Rebecca Lewis this is a voice from the past.  This is Rebecca Lewis.  I saw Dr. Ardelle Anton today.  We're going to do surgery.  He knows I have a question about when to do it, how soon before I went on my mission trip or after.  I just want to check with you and see.  I'll talk to you when you give me a call back.  Rebecca Lewis something I will ask, is how soon if I started today or even tomorrow will he be able to do surgery this month.  I know he's got Wednesdays only.  I just want to know if he has any Wednesdays available in this month.  It will be part of my question, okay thank you."

## 2018-04-04 NOTE — Telephone Encounter (Signed)
We can do it this month if she would like. However given her upcoming trips she would need to make sure she has enough recovery time.

## 2018-04-04 NOTE — Progress Notes (Signed)
Subjective: Rebecca Lewis presents the office today for concerns of worsening pain the also aspect of the left ankle.  She has been doing physical therapy the pain is gotten worse and so discussed surgery for her left ankle.  She states that despite bracing physical therapy her symptoms have worsened and showed to proceed with possible surgery to repair the peroneal tendon.  Patient is overall her right foot is doing about the same.  She still has pain over the ankle as well as the fifth toe.  Overall her right foot is about the same.  She is going to get an [place] brace possibly. Denies any systemic complaints such as fevers, chills, nausea, vomiting. No acute changes since last appointment, and no other complaints at this time.   Objective: AAO x3, NAD DP/PT pulses palpable bilaterally, CRT less than 3 seconds On the lateral aspect the left ankle along the course of the peroneal tendon there is tenderness palpation there is trace edema but there is no erythema or increase in warmth.  Overall the peroneal tendon appears to be grossly intact on the left side.  On the right side there is also tenderness in the peroneal tendon.  There is hammertoe deformity of fifth digit with adductovarus present.  Mild hyperkeratotic tissue submetatarsal right foot without any underlying ulceration drainage or any signs of infection.  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 No open lesions or pre-ulcerative lesions.  No pain with calf compression, swelling, warmth, erythema  Assessment: Longitudinal split tear left peroneal tendon; chronic right ankle pain  Plan: -All treatment options discussed with the patient including all alternatives, risks, complications.  -I reviewed the MRI with her again.  In regard to the left foot peroneal tendon longitudinal tear we discussed both conservative as well as surgical options.  Given her worsening pain she will to proceed with surgery.  We discussed the peroneal tendon repair.  We  discussed the surgery as well as the postoperative course and recovery.  She will think about this. -In regards to the right foot we discussed treatment options.  Given that she has had the tendon repaired a couple of times without any significant resolution in the reoccurred as well as her cavus foot type I recommended a [place] type brace.  We will get her molded for the right side for an [place] brace.  We will hold off on the [place] on the left foot as her pain is localized to the peroneal tendon hopefully the pain will be improved or resolved after surgery. -Patient encouraged to call the office with any questions, concerns, change in symptoms.   Vivi Barrack DPM

## 2018-04-04 NOTE — Telephone Encounter (Signed)
"  I saw Dr. Ardelle Anton yesterday.  I'd like to have surgery this month if possible.  Please ask Dr. Ardelle Anton if this is okay."

## 2018-04-06 ENCOUNTER — Ambulatory Visit: Payer: Self-pay | Admitting: Podiatry

## 2018-04-06 NOTE — Telephone Encounter (Signed)
I left the patient a message that Dr. Ardelle Anton said he would do it this month but you probably will not be healed by your trip.  I asked her to give me a call back on tomorrow.

## 2018-04-07 NOTE — Telephone Encounter (Signed)
"  Thanks for returning my call.  I have two options, one is to go on a trip with my son to do a mini-vacation to [place] or do the mission trip.  As much as I would love to do [place] I am going to forego it and go on the trip with my son.  So, I want to go ahead and schedule my surgery."  Have you signed consent forms?  "No, I have not."  You will need to see Dr. Ardelle Anton for a consultation.  "I think he mentioned to me that I would have to see him again as I think about it."  Would you like me to send you to a scheduler so you can make an appointment?  "Yes, that would be great."  Call was transferred to [facility].

## 2018-04-13 ENCOUNTER — Ambulatory Visit: Payer: 59 | Admitting: Orthotics

## 2018-04-13 DIAGNOSIS — M216X9 Other acquired deformities of unspecified foot: Secondary | ICD-10-CM

## 2018-04-13 NOTE — Progress Notes (Signed)
Adjusted.

## 2018-04-14 ENCOUNTER — Ambulatory Visit: Payer: 59 | Admitting: Podiatry

## 2018-04-14 ENCOUNTER — Encounter: Payer: Self-pay | Admitting: Podiatry

## 2018-04-14 DIAGNOSIS — M216X9 Other acquired deformities of unspecified foot: Secondary | ICD-10-CM | POA: Diagnosis not present

## 2018-04-14 DIAGNOSIS — T148XXA Other injury of unspecified body region, initial encounter: Secondary | ICD-10-CM | POA: Diagnosis not present

## 2018-04-14 NOTE — Patient Instructions (Signed)
Pre-Operative Instructions  Congratulations, you have decided to take an important step towards improving your quality of life.  You can be assured that the doctors and staff at [facility] will be with you every step of the way.  Here are some important things you should know:  1. Plan to be at the surgery center/hospital at least 1 (one) hour prior to your scheduled time, unless otherwise directed by the surgical center/hospital staff.  You must have a responsible adult accompany you, remain during the surgery and drive you home.  Make sure you have directions to the surgical center/hospital to ensure you arrive on time. 2. If you are having surgery at [facility] or [facility] hospitals, you will need a copy of your medical history and physical form from your family physician within one month prior to the date of surgery. We will give you a form for your primary physician to complete.  3. We make every effort to accommodate the date you request for surgery.  However, there are times where surgery dates or times have to be moved.  We will contact you as soon as possible if a change in schedule is required.   4. No aspirin/ibuprofen for one week before surgery.  If you are on aspirin, any non-steroidal anti-inflammatory medications (Mobic, Aleve, Ibuprofen) should not be taken seven (7) days prior to your surgery.  You make take Tylenol for pain prior to surgery.  5. Medications - If you are taking daily heart and blood pressure medications, seizure, reflux, allergy, asthma, anxiety, pain or diabetes medications, make sure you notify the surgery center/hospital before the day of surgery so they can tell you which medications you should take or avoid the day of surgery. 6. No food or drink after midnight the night before surgery unless directed otherwise by surgical center/hospital staff. 7. No alcoholic beverages 24-hours prior to surgery.  No smoking 24-hours prior or 24-hours after  surgery. 8. Wear loose pants or shorts. They should be loose enough to fit over bandages, boots, and casts. 9. Don't wear slip-on shoes. Sneakers are preferred. 10. Bring your boot with you to the surgery center/hospital.  Also bring crutches or a walker if your physician has prescribed it for you.  If you do not have this equipment, it will be provided for you after surgery. 11. If you have not been contacted by the surgery center/hospital by the day before your surgery, call to confirm the date and time of your surgery. 12. Leave-time from work may vary depending on the type of surgery you have.  Appropriate arrangements should be made prior to surgery with your employer. 13. Prescriptions will be provided immediately following surgery by your doctor.  Fill these as soon as possible after surgery and take the medication as directed. Pain medications will not be refilled on weekends and must be approved by the doctor. 14. Remove nail polish on the operative foot and avoid getting pedicures prior to surgery. 15. Wash the night before surgery.  The night before surgery wash the foot and leg well with water and the antibacterial soap provided. Be sure to pay special attention to beneath the toenails and in between the toes.  Wash for at least three (3) minutes. Rinse thoroughly with water and dry well with a towel.  Perform this wash unless told not to do so by your physician.  Enclosed: 1 Ice pack (please put in freezer the night before surgery)   1 Hibiclens skin cleaner     Pre-op instructions  If you have any questions regarding the instructions, please do not hesitate to call our office.  [place]: [address], [place], [place] [address] -- [phone]  [place]: [address] [place], [place] [address] -- [phone]  [place]: [address]  [place], [place] [address] -- [phone]  [place]: [address], [address], [place], [place] [address] -- [phone]  Website:  [email or web address] 

## 2018-04-17 ENCOUNTER — Telehealth: Payer: Self-pay | Admitting: *Deleted

## 2018-04-17 ENCOUNTER — Telehealth: Payer: Self-pay | Admitting: Podiatry

## 2018-04-17 NOTE — Telephone Encounter (Signed)
This is really important to get this done as soon as possible because this is Rebecca Lewis. I wish I could speak to Dr. Ardelle Anton but I really need to think about changing the surgery foot. We are scheduled to do a left side surgery foot on 05 June. I know on my right foot we were going to do that surgery eventually too. My right foot is really sore and the right pinky toe is. I realized it would be better to do the right foot first than the left side so the pinky toe can be done at the same time. Please before you go to him say [name] say's please forgive me for changing foot sides because he possible may have already sent in information for the left side surgery. Please if you could do that, if I need to come in and sign a new release form you know I'm fine with doing that. Give me a call at [phone]. Thank you.

## 2018-04-17 NOTE — Progress Notes (Signed)
Subjective: 72 year old female presents the office today for surgical consultation due to ongoing pain to the left ankle.  She was an MRI that did show partial tear of the peroneal tendon she has had ongoing symptoms to this area and this time she was to go to proceed with surgery to repair this.  This is been ongoing issue and her other foot and this is new on her left foot.  She has tried bracing, shoe modifications and multiple modifications orthotics without any significant improvement. Denies any systemic complaints such as fevers, chills, nausea, vomiting. No acute changes since last appointment, and no other complaints at this time.   Objective: AAO x3, NAD DP/PT pulses palpable bilaterally, CRT less than 3 seconds There is tenderness which appears to be worse than last appointment on the course of the peroneal tendon on the lateral aspect of the ankle just inferior to the lateral malleolus and proximal to fifth metatarsal base.  Overall the tendon appears to be intact.  There is minimal edema there is no significant erythema or increase in warmth.  There is cavus foot deformity present bilaterally.  There is also tenderness on the peroneal tendon on the right side.  No open lesions or pre-ulcerative lesions.  No pain with calf compression, swelling, warmth, erythema  Assessment: Peroneal tendon tear left ankle  Plan: -All treatment options discussed with the patient including all alternatives, risks, complications.  -At this time we discussed both conservative as well as surgical treatment options.  I do believe that this is been ongoing issue.  We discussed surgical intervention of this is not a guarantee resolution of symptoms.  We discussed an [place] brace however I think this to be more beneficial in her right foot if we need one before to hold off on that if we can on the left side.  Discussed the risks and complications of surgery and she understands this and she wishes to go and proceed  with this. -We will plan for left foot peroneal tendon repair on June 5 -The incision placement as well as the postoperative course was discussed with the patient. I discussed risks of the surgery which include, but not limited to, infection, bleeding, pain, swelling, need for further surgery, delayed or nonhealing, painful or ugly scar, numbness or sensation changes, over/under correction, recurrence, transfer lesions, further deformity, tendon can retear, DVT/PE, loss of toe/foot. Patient understands these risks and wishes to proceed with surgery. The surgical consent was reviewed with the patient all 3 pages were signed. No promises or guarantees were given to the outcome of the procedure. All questions were answered to the best of my ability. Before the surgery the patient was encouraged to call the office if there is any further questions. The surgery will be performed at the [facility] on an outpatient basis. -Patient encouraged to call the office with any questions, concerns, change in symptoms.   Vivi Barrack DPM

## 2018-04-17 NOTE — Telephone Encounter (Signed)
"  I called earlier about my surgery.  I left a message for the nurse."  Yes, she sent me your message.  You will have to see Dr. Ardelle Anton for another consultation.  "I can't just come by to sign the forms without seeing him?"  No ma'am, you will have to see him to sign a new consent form.  "Okay, if that's what I have to do.  This is going to delay my surgery even more but it has to be done."  Would you like me to transfer you to a scheduler.  "Yes, if you don't mind."  I transferred the call to a scheduler.

## 2018-04-17 NOTE — Telephone Encounter (Signed)
"  I know that one of the girls, I think Rebecca Lewis Lewis, spoke to [name].  She said Rebecca Lewis Lewis said I didn't need a consultation again.  My only concern is that I know he has to write up a new contract.  Then I will come in and sign that.  I don't know if I'm still scheduled for June 5.  Do I call the surgery center yet?  I haven't yet, I know I'm supposed to give them all the information and all that.  Please give me an update so I know where we stand.  I don't want to mess anything up for the future surgery.  Even if he has to postpone it for a week.  Thank you so much with all my heart."  I'm returning your call.  "I am so sorry I am causing so much trouble."  You have to see Dr. Ardelle Anton for another consultation.  "I know you had told me that I needed to schedule another consultation but Rebecca Lewis Lewis asked Rebecca Lewis Lewis and Rebecca Lewis Lewis said I didn't have to see him again for the consultation."  Rebecca Lewis Lewis was incorrect, you do need to see Dr. Ardelle Anton again.  "Okay, when can he see me?  I know they said he was booked up."  I'll transfer you to a scheduler.  I transferred Rebecca Lewis to Rebecca Lewis.

## 2018-04-20 ENCOUNTER — Ambulatory Visit: Payer: 59 | Admitting: Podiatry

## 2018-04-20 DIAGNOSIS — M2031 Hallux varus (acquired), right foot: Secondary | ICD-10-CM

## 2018-04-20 NOTE — Patient Instructions (Signed)

## 2018-04-25 NOTE — Progress Notes (Signed)
Subjective: 72 year old female presents today to discuss her surgery.  She is scheduled to have her left peroneal tendon repair however she states that her right foot, the fifth toe has been hurting quite a bit and she would like to have the surgery done before proceeding with a left ankle.  She says that the right fifth toe hurts on a daily basis with shoes and pressure and this is limiting her activity more.  She is tried offloading as well as different orthotic modifications and symptom improvement.  No recent injury. Denies any systemic complaints such as fevers, chills, nausea, vomiting. No acute changes since last appointment, and no other complaints at this time.   Objective: AAO x3, NAD DP/PT pulses palpable bilaterally, CRT less than 3 seconds Hammertoe contracture present to the right fifth digit and there is tenderness to the right fifth PIPJ.  Also there is a hyperkeratotic lesion on the right fourth PIPJ with subjective tenderness to palpation of this area as well.  The other toes are contracted.  There is cavus deformity present bilaterally.  There is still tenderness on the course of the peroneal tendons bilaterally.  On the left side her exam is unchanged there is no edema, erythema, increase in warmth. No open lesions or pre-ulcerative lesions.  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  -We discussed both conservative as well as surgical treatment of the right foot.  Discussed the delay in the left ankle could result in a tear however the right foot is causing more issues with walking and there is concern about the medial left foot scuff the right foot hurt more.  After discussion she was going to proceed with surgery in the right foot.  Discussed the PIPJ arthroplasty of the right fourth  and fifth digits.  We discussed the surgery as well as the postoperative course.  We will discern the right foot first and likely plan for the left ankle in about a month. -The incision placement as well as the postoperative course was discussed with the patient. I discussed risks of the surgery which include, but not limited to, infection, bleeding, pain, swelling, need for further surgery, delayed or nonhealing, painful or ugly scar, numbness or sensation changes, over/under correction, recurrence, transfer lesions, further deformity, hardware failure, DVT/PE, loss of toe/foot. Patient understands these risks and wishes to proceed with surgery. The surgical consent was reviewed with the patient all 3 pages were signed. No promises or guarantees were given to the outcome of the procedure. All questions were answered to the best of my ability. Before the surgery the patient was encouraged to call the office if there is any further questions. The surgery will be performed at the [facility] on an outpatient basis. -Patient encouraged to call the office with any questions, concerns, change in symptoms.   Vivi Barrack DPM

## 2018-05-01 ENCOUNTER — Telehealth: Payer: Self-pay | Admitting: Podiatry

## 2018-05-01 NOTE — Telephone Encounter (Signed)
I'm scheduled for surgery with Dr. Ardelle AntonWagoner on Wednesday. I spoke to him and he said he would prescribe my medication a head of time. I don't know if this is too much ahead of time. I was wondering, I don't know if you can call it in or if I have to come to the office to pick up the scripts. I don't know, and that's what is important to me. If its a call in then surely you would call it in tomorrow and I would pick it up tomorrow since I'm the only one pretty much taking care of myself. If I have to pick it up, I would like to pick it up today so I can stay at home all day tomorrow to get all the last minute things done. And remind him that it has to be the 10 mg percocet, nothing with hydrocodone, just percocet. If you could call me back I would appreciate it at [phone]. Alright, thank you. Bye bye.

## 2018-05-02 ENCOUNTER — Other Ambulatory Visit: Payer: Self-pay | Admitting: Podiatry

## 2018-05-02 ENCOUNTER — Telehealth: Payer: Self-pay | Admitting: *Deleted

## 2018-05-02 ENCOUNTER — Telehealth: Payer: Self-pay | Admitting: Podiatry

## 2018-05-02 MED ORDER — CEPHALEXIN 500 MG PO CAPS: 500.0000 mg | ORAL_CAPSULE | Freq: Three times a day (TID) | ORAL | 0 refills | Status: DC

## 2018-05-02 MED ORDER — OXYCODONE-ACETAMINOPHEN 5-325 MG PO TABS: 1.0000 | ORAL_TABLET | ORAL | 0 refills | Status: DC | PRN

## 2018-05-02 MED ORDER — PROMETHAZINE HCL 25 MG PO TABS: 25.0000 mg | ORAL_TABLET | Freq: Three times a day (TID) | ORAL | 0 refills | Status: DC | PRN

## 2018-05-02 NOTE — Telephone Encounter (Signed)
Prescriptions sent

## 2018-05-02 NOTE — Progress Notes (Signed)
Pre-op medications sent to pharmacy

## 2018-05-02 NOTE — Telephone Encounter (Signed)
Called patient and left a message that Dr Ardelle AntonWagoner had sent over the Rx's to the pharmacy before the surgery tomorrow and to call the [place] office if any concerns or questions at [phone]. Misty StanleyLisa

## 2018-05-02 NOTE — Telephone Encounter (Signed)
I  Informed pt the rxs had been sent to the [facility] [id number].

## 2018-05-02 NOTE — Telephone Encounter (Signed)
Pt is wanting to get medication sent to [facility] in [place] before her surgery which is tomorrow. Dr. Ardelle AntonWagoner is aware and her medication has to be Percocet 10mg . Please give her a call today once it is sent over.

## 2018-05-03 ENCOUNTER — Encounter: Payer: Self-pay | Admitting: Podiatry

## 2018-05-03 DIAGNOSIS — M2041 Other hammer toe(s) (acquired), right foot: Secondary | ICD-10-CM | POA: Diagnosis not present

## 2018-05-05 ENCOUNTER — Other Ambulatory Visit: Payer: Self-pay | Admitting: Podiatry

## 2018-05-05 ENCOUNTER — Telehealth: Payer: Self-pay | Admitting: Podiatry

## 2018-05-05 DIAGNOSIS — E559 Vitamin D deficiency, unspecified: Secondary | ICD-10-CM | POA: Insufficient documentation

## 2018-05-05 DIAGNOSIS — R209 Unspecified disturbances of skin sensation: Secondary | ICD-10-CM | POA: Insufficient documentation

## 2018-05-05 DIAGNOSIS — M461 Sacroiliitis, not elsewhere classified: Secondary | ICD-10-CM | POA: Insufficient documentation

## 2018-05-05 DIAGNOSIS — R42 Dizziness and giddiness: Secondary | ICD-10-CM | POA: Insufficient documentation

## 2018-05-05 DIAGNOSIS — M5030 Other cervical disc degeneration, unspecified cervical region: Secondary | ICD-10-CM | POA: Insufficient documentation

## 2018-05-05 MED ORDER — OXYCODONE HCL 10 MG PO TABS: 10.0000 mg | ORAL_TABLET | ORAL | 0 refills | Status: DC | PRN

## 2018-05-05 NOTE — Telephone Encounter (Signed)
I know Dr. Ardelle AntonWagoner said to give a call back in regards to a refill of the oxycodone. I would like to go ahead and ask for a refill. I would like the 10 mg without any Asprin or acetaminophen. If I could get a prescription refill for 10 mg oxycodone, that's what I need. Give me a call please to let me know if that has been ordered so I can have pick someone pick it up from the drugstore for me. Thanks very much. Have a great day.

## 2018-05-05 NOTE — Telephone Encounter (Signed)
Attempted to return patients call to inform her that Dr Ardelle AntonWagoner will write her for Oxycodone 10mg  Q6-8 hours prn pain #15, the phone rang, then went silent.

## 2018-05-08 ENCOUNTER — Ambulatory Visit (INDEPENDENT_AMBULATORY_CARE_PROVIDER_SITE_OTHER): Payer: 59 | Admitting: Podiatry

## 2018-05-08 ENCOUNTER — Ambulatory Visit (INDEPENDENT_AMBULATORY_CARE_PROVIDER_SITE_OTHER): Payer: 59

## 2018-05-08 ENCOUNTER — Encounter: Payer: Self-pay | Admitting: Podiatry

## 2018-05-08 VITALS — Temp 98.3°F

## 2018-05-08 DIAGNOSIS — M2041 Other hammer toe(s) (acquired), right foot: Secondary | ICD-10-CM

## 2018-05-08 DIAGNOSIS — Z9889 Other specified postprocedural states: Secondary | ICD-10-CM

## 2018-05-09 DIAGNOSIS — M2041 Other hammer toe(s) (acquired), right foot: Secondary | ICD-10-CM | POA: Insufficient documentation

## 2018-05-09 NOTE — Progress Notes (Signed)
Subjective: Rebecca DecampLinda Lewis is a 72 y.o. is seen today in office s/p right foot fourth and fifth digit hammertoe repair preformed on 05/03/2018. They state their pain is improving.  She states that she is having difficulty taking the Percocet as it was causing some acid reflux and so we switched to the oxycodone without Tylenol.  She states this is more helpful for her.  She is remained in the surgical boot.  Denies any systemic complaints such as fevers, chills, nausea, vomiting. No calf pain, chest pain, shortness of breath.   Objective: General: No acute distress, AAOx3  DP/PT pulses palpable 2/4, CRT < 3 sec to all digits.  Protective sensation intact. Motor function intact.  Right foot: Incision is well coapted without any evidence of dehiscence and sutures are intact.  K wire intact the fourth toe without any drainage or pus.  There is mild swelling to the toes and faint erythema but this is more from the swelling and inflammation but there is no increase in warmth, drainage or pus or ascending cellulitisfemale from inflammation as opposed to infection.  The toes are in rectus position.   No other areas of tenderness to bilateral lower extremities.  No other open lesions or pre-ulcerative lesions.  No pain with calf compression, swelling, warmth, erythema.   Assessment and Plan:  Status post right foot surgery, doing well with no complications   -Treatment options discussed including all alternatives, risks, and complications -X-rays were obtained and reviewed.  Status post hammertoe repair of the right fourth and fifth toes without any evidence of acute fracture.  K wire intact. -Antibiotic ointment and a bandage was applied.  Keep the dressing clean, dry, intact. -Pending surgical date. -Ice/elevation -Pain medication as needed. -Monitor for any clinical signs or symptoms of infection and DVT/PE and directed to call the office immediately should any occur or go to the ER. -Follow-up as  scheduled or sooner if any problems arise. In the meantime, encouraged to call the office with any questions, concerns, change in symptoms.   Ovid CurdMatthew Lashann Hagg, DPM

## 2018-05-11 ENCOUNTER — Telehealth: Payer: Self-pay | Admitting: Podiatry

## 2018-05-11 ENCOUNTER — Other Ambulatory Visit: Payer: Self-pay | Admitting: Podiatry

## 2018-05-11 MED ORDER — OXYCODONE HCL 5 MG PO CAPS: 5.0000 mg | ORAL_CAPSULE | ORAL | 0 refills | Status: DC | PRN

## 2018-05-11 NOTE — Telephone Encounter (Signed)
Pt is calling again for medication to be sent to her pharmacy.

## 2018-05-11 NOTE — Telephone Encounter (Signed)
I informed pt the medication was at the pharmacy.

## 2018-05-11 NOTE — Telephone Encounter (Signed)
Done

## 2018-05-11 NOTE — Telephone Encounter (Signed)
done

## 2018-05-11 NOTE — Telephone Encounter (Signed)
When I saw Dr. Ardelle AntonWagoner on Monday I told him I was getting short of the oxycodone. He said to just give a call, I know we are going to lower it down to 5 mg. So that's why I'm calling. That's oxycodone with no acetaminophen, I know he know's that, its just as a reminder. That would be at the [facility] in [place] on [address]. I would appreciate if this could be called in today as I just took my last pain pill at 6 am this morning. Thank you.

## 2018-05-15 ENCOUNTER — Encounter: Payer: Self-pay | Admitting: Podiatry

## 2018-05-16 ENCOUNTER — Telehealth: Payer: Self-pay | Admitting: Podiatry

## 2018-05-16 ENCOUNTER — Other Ambulatory Visit: Payer: Self-pay | Admitting: Podiatry

## 2018-05-16 MED ORDER — OXYCODONE HCL 5 MG PO CAPS: 5.0000 mg | ORAL_CAPSULE | Freq: Four times a day (QID) | ORAL | 0 refills | Status: DC | PRN

## 2018-05-16 NOTE — Telephone Encounter (Signed)
I informed pt Dr. Ardelle AntonWagoner had sent her rx to the pharmacy.

## 2018-05-16 NOTE — Telephone Encounter (Signed)
done

## 2018-05-16 NOTE — Telephone Encounter (Signed)
I would like to go ahead and request another refill of the oxycodone 5 mg. I'm really trying but the pain is still there. I can usually pretty much go 8 hours without anything, 1 time maybe 10 hours but the foot wants to burn so badly. I know I had a really bad episode Saturday morning. Anyway's, if Dr. Ardelle AntonWagoner would go ahead and call that in today, I hopefully will be able to get someone to go and pick the prescription up. It is the [facility] here in [place] on [address]. If you will call me back at [phone] and let me know when it has been called in so I can hopefully get someone to go get that for me. Thank you so ever much.

## 2018-05-18 NOTE — Telephone Encounter (Signed)
error 

## 2018-05-19 ENCOUNTER — Encounter: Payer: Self-pay | Admitting: Podiatry

## 2018-05-19 ENCOUNTER — Ambulatory Visit (INDEPENDENT_AMBULATORY_CARE_PROVIDER_SITE_OTHER): Payer: Self-pay | Admitting: Podiatry

## 2018-05-19 DIAGNOSIS — M2041 Other hammer toe(s) (acquired), right foot: Secondary | ICD-10-CM

## 2018-05-19 DIAGNOSIS — Z9889 Other specified postprocedural states: Secondary | ICD-10-CM

## 2018-05-21 NOTE — Progress Notes (Signed)
Subjective: Rebecca DecampLinda Lewis is a 72 y.o. is seen today in office s/p right foot fourth and fifth digit hammertoe repair preformed on 05/03/2018.  She states that she is doing better the pain is much improved.  She can decrease the amount of pain medicine she is been taking.  She takes the pain medicine she feels an electric shock go to her toes and this is intermittent.  She remains a surgical shoe but she does admit that she has been walking around the house without the surgical shoe on.  She has been trying ice and elevate as well.  Denies any recent injury.  She has no other concerns today. Denies any systemic complaints such as fevers, chills, nausea, vomiting. No calf pain, chest pain, shortness of breath.   Objective: General: No acute distress, AAOx3  DP/PT pulses palpable 2/4, CRT < 3 sec to all digits.  Protective sensation intact. Motor function intact.  Right foot: Incision is well coapted without any evidence of dehiscence and sutures are intact.  K wire intact the fourth toe without any drainage or pus.  There is minimal swelling to the toe and there is no erythema or increase in warmth.  There is no drainage or pus there is no clinical signs of infection noted today.  The toes are in rectus position and there is no significant discomfort on palpation today.   No other open lesions or pre-ulcerative lesions.  No pain with calf compression, swelling, warmth, erythema.   Assessment and Plan:  Status post right foot surgery, doing well with no complications   -Treatment options discussed including all alternatives, risks, and complications -Today sutures removed without complications.  After removal incision remained well coapted.  Antibiotic ointment and a bandage applied.  Keep dressing clean, dry, intact.  Do not get the area wet. -Remain in surgical shoe at all times. -Ice and elevate -Pain medication as needed -Monitor for any clinical signs or symptoms of infection and directed to call  the office immediately should any occur or go to the ER.  X-ray next appointment  Return in about 3 weeks (around 06/09/2018).  Vivi BarrackMatthew R Kathie Posa DPM

## 2018-05-22 ENCOUNTER — Ambulatory Visit (INDEPENDENT_AMBULATORY_CARE_PROVIDER_SITE_OTHER): Payer: 59

## 2018-05-22 ENCOUNTER — Telehealth: Payer: Self-pay | Admitting: Podiatry

## 2018-05-22 ENCOUNTER — Encounter: Payer: Self-pay | Admitting: Podiatry

## 2018-05-22 ENCOUNTER — Ambulatory Visit (INDEPENDENT_AMBULATORY_CARE_PROVIDER_SITE_OTHER): Payer: 59 | Admitting: Podiatry

## 2018-05-22 DIAGNOSIS — M2041 Other hammer toe(s) (acquired), right foot: Secondary | ICD-10-CM

## 2018-05-22 DIAGNOSIS — Z9889 Other specified postprocedural states: Secondary | ICD-10-CM

## 2018-05-23 DIAGNOSIS — S134XXA Sprain of ligaments of cervical spine, initial encounter: Secondary | ICD-10-CM | POA: Insufficient documentation

## 2018-05-23 DIAGNOSIS — M47812 Spondylosis without myelopathy or radiculopathy, cervical region: Secondary | ICD-10-CM | POA: Insufficient documentation

## 2018-05-23 DIAGNOSIS — G8929 Other chronic pain: Secondary | ICD-10-CM | POA: Insufficient documentation

## 2018-05-23 DIAGNOSIS — Y990 Civilian activity done for income or pay: Secondary | ICD-10-CM | POA: Insufficient documentation

## 2018-05-25 ENCOUNTER — Telehealth: Payer: Self-pay | Admitting: *Deleted

## 2018-05-25 NOTE — Telephone Encounter (Signed)
She probably won't be ready for that date. We should reschedule when she comes in.

## 2018-05-25 NOTE — Progress Notes (Signed)
Subjective: Silverio DecampLinda Walraven is a 72 y.o. is seen today in office s/p right foot fourth and fifth digit hammertoe repair preformed on 05/03/2018.  She presents today for an add-on appointment and she states that she was getting a shower and the pin did poke a hole and she got the bandage wet.  She denies a couple of blood spots on the bandage.  She did have some increase in pain however overall her pain is much better controlled than what it has been.  She is been wearing the surgical shoe more often.  She denies any recent injury.  She has no other concerns.   She has no other concerns today. Denies any systemic complaints such as fevers, chills, nausea, vomiting. No calf pain, chest pain, shortness of breath.   Objective: General: No acute distress, AAOx3  DP/PT pulses palpable 2/4, CRT < 3 sec to all digits.  Protective sensation intact. Motor function intact.  Right foot: Incision is well coapted without any evidence of dehiscence and scars are formed.  There is macerated tissue mostly to the plantar aspect of the toe.  There is no drainage or pus come from the incision and there is no edema, erythema to the toe.  The toes are in rectus position there is no discomfort to palpation of the toes today.  She describes electric pain to the toes and this is ongoing prior to ensure that the area wet.  She has pain medicine for that. No other open lesions or pre-ulcerative lesions.  No pain with calf compression, swelling, warmth, erythema.   Assessment and Plan:  Status post right foot surgery, doing well with no complications   -Treatment options discussed including all alternatives, risks, and complications -Small amount of Betadine was applied to the macerated tissue as well as the incision.  A dressing was applied.  She can do this every couple days if needed.  Remain in surgical shoe at all times.  Continue ice and elevation.  Pain medication as needed.  Overall her pain is much better and she is not  overtly electric shock that she gets this is intermittent in nature. -Monitor for any clinical signs or symptoms of infection and directed to call the office immediately should any occur or go to the ER. -Follow-up as scheduled or sooner if needed  Vivi BarrackMatthew R Wagoner DPM

## 2018-05-25 NOTE — Telephone Encounter (Signed)
"  We called Rebecca DecampLinda Lewis and she said she never scheduled surgery for July 10."  She did schedule her surgery for that date.  Dr. Ardelle AntonWagoner just told me yesterday that Rebecca Lewis may cancel her surgery.  I'll take her off.

## 2018-06-05 ENCOUNTER — Encounter: Payer: Self-pay | Admitting: Podiatry

## 2018-06-05 ENCOUNTER — Ambulatory Visit (INDEPENDENT_AMBULATORY_CARE_PROVIDER_SITE_OTHER): Admitting: Podiatry

## 2018-06-05 ENCOUNTER — Ambulatory Visit (INDEPENDENT_AMBULATORY_CARE_PROVIDER_SITE_OTHER): Payer: 59

## 2018-06-05 VITALS — BP 148/78 | HR 75 | Temp 97.5°F | Resp 18

## 2018-06-05 DIAGNOSIS — M2041 Other hammer toe(s) (acquired), right foot: Secondary | ICD-10-CM

## 2018-06-05 DIAGNOSIS — Z9889 Other specified postprocedural states: Secondary | ICD-10-CM

## 2018-06-06 NOTE — Progress Notes (Signed)
Subjective: Rebecca DecampLinda Lewis is a 72 y.o. is seen today in office s/p right foot fourth and fifth digit hammertoe repair preformed on 05/03/2018.  She states that she keeps having the pain.  She denies any drainage or pus coming from the wire site.  She is doing a surgical shoe.  Pain is much improved.  She has internal ice and elevate but she has been walking on her foot but not her normal walking. She has no other concerns today. Denies any systemic complaints such as fevers, chills, nausea, vomiting. No calf pain, chest pain, shortness of breath.   Objective: General: No acute distress, AAOx3  DP/PT pulses palpable 2/4, CRT < 3 sec to all digits.  Protective sensation intact. Motor function intact.  Right foot: Incision is well coapted without any evidence of dehiscence and scars are forming.  K wire is intact to the fourth toe without any drainage or pus coming from the area but is loose.  The toes are in rectus position.  No significant tenderness palpation of the surgical sites.  Minimal edema there is no erythema increased warmth there is no drainage or pus coming from the incision there is no clinical signs of infection present today. No other open lesions or pre-ulcerative lesions.  No pain with calf compression, swelling, warmth, erythema.   Assessment and Plan:  Status post right foot surgery, doing well with no complications   -Treatment options discussed including all alternatives, risks, and complications -X-rays were obtained and reviewed with patient.  No evidence of acute fracture.  K wire intact the fourth toe.  Status post hammertoe repair of the fourth and fifth toes. -Today I did remove the K wire as it was loose in total without any complications.  The area was cleaned with alcohol followed by antibiotic ointment and a bandage.  Starting in 2 days she can start showering the area wet once the site closes.  Continue antibiotic ointment to the area daily.  Continue with surgical shoe,  ice and elevation.  Pain medication as needed. -Monitor for any clinical signs or symptoms of infection and directed to call the office immediately should any occur or go to the ER. -Follow-up as scheduled or sooner if needed  Return in about 2 weeks (around 06/19/2018).  Rebecca BarrackMatthew R Jene Lewis DPM

## 2018-06-09 ENCOUNTER — Encounter: Payer: Self-pay | Admitting: Podiatry

## 2018-06-12 ENCOUNTER — Encounter: Payer: Self-pay | Admitting: Podiatry

## 2018-06-22 ENCOUNTER — Ambulatory Visit: Payer: 59 | Admitting: Orthotics

## 2018-06-22 ENCOUNTER — Ambulatory Visit (INDEPENDENT_AMBULATORY_CARE_PROVIDER_SITE_OTHER): Payer: Self-pay | Admitting: Podiatry

## 2018-06-22 ENCOUNTER — Encounter: Payer: Self-pay | Admitting: Podiatry

## 2018-06-22 DIAGNOSIS — Z9889 Other specified postprocedural states: Secondary | ICD-10-CM

## 2018-06-22 DIAGNOSIS — M2041 Other hammer toe(s) (acquired), right foot: Secondary | ICD-10-CM

## 2018-06-22 DIAGNOSIS — M216X1 Other acquired deformities of right foot: Secondary | ICD-10-CM

## 2018-06-22 DIAGNOSIS — M2031 Hallux varus (acquired), right foot: Secondary | ICD-10-CM

## 2018-06-22 NOTE — Progress Notes (Signed)
Saw patient today for follow up f/o.  She is still having lateral ankle instablity and it has been suggested she get an Az AFO to provide her that stability; however, she has a prominent 1st met head RIGHT that has caused her much discomfort in the past.  She has an old everfeet f/o from Dr. Blenda Mounts days that has worked fairly well..I suggest she be casted and f/o be made from the cast with xtra deep heel seat/ lateral flange/ and 4* lateral wedge.

## 2018-06-25 NOTE — Progress Notes (Signed)
Subjective: Rebecca DecampLinda Greaves is a 72 y.o. is seen today in office s/p right foot fourth and fifth digit hammertoe repair preformed on 05/03/2018.  She did return to regular shoe today and states the first that she is wearing a shoe.  She does feel the fourth and fifth toes are rubbing some as there is still some swelling but she denies any open sores and she feels that the incision is healed very well.  She is not taking any significant pain medication for her feet at this time. Denies any systemic complaints such as fevers, chills, nausea, vomiting. No calf pain, chest pain, shortness of breath.   Objective: General: No acute distress, AAOx3  DP/PT pulses palpable 2/4, CRT < 3 sec to all digits.  Protective sensation intact. Motor function intact.  Right foot: Incision is well coapted without any evidence of dehiscence and scars have formed.  There is minimal scab along the fourth toe which I debrided today and the underlying skin was intact and there is no open lesion identified.  There is mild swelling to the toe.  The toes in position.  There is no erythema or increase in warmth.  There is no significant discomfort to palpation of the surgical sites.  No other open lesions or pre-ulcerative lesions.  No pain with calf compression, swelling, warmth, erythema.   Assessment and Plan:  Status post right foot surgery, doing well with no complications   -Treatment options discussed including all alternatives, risks, and complications -Surgical sites appear to be healing well.  Today she transition to regular shoe.  I did dispense a toe separator between her fourth and fifth toes to help take pressure off the area.  Gradually increase activity level.  She has an older pair of orthotics that are comfortable.  -Continue ice elevate.  Discussed improving her trying a wider shoe for now. -She was to hold off on any further surgery this point for other issues.  Vivi BarrackMatthew R Danylle Ouk DPM

## 2018-07-10 ENCOUNTER — Ambulatory Visit: Payer: 59 | Admitting: Orthotics

## 2018-07-10 ENCOUNTER — Ambulatory Visit: Payer: 59

## 2018-07-10 ENCOUNTER — Encounter: Payer: Self-pay | Admitting: Podiatry

## 2018-07-10 ENCOUNTER — Ambulatory Visit (INDEPENDENT_AMBULATORY_CARE_PROVIDER_SITE_OTHER): Payer: 59 | Admitting: Podiatry

## 2018-07-10 DIAGNOSIS — Z9889 Other specified postprocedural states: Secondary | ICD-10-CM

## 2018-07-10 DIAGNOSIS — M216X1 Other acquired deformities of right foot: Secondary | ICD-10-CM

## 2018-07-10 DIAGNOSIS — T148XXA Other injury of unspecified body region, initial encounter: Secondary | ICD-10-CM

## 2018-07-10 DIAGNOSIS — M2031 Hallux varus (acquired), right foot: Secondary | ICD-10-CM

## 2018-07-10 DIAGNOSIS — M2041 Other hammer toe(s) (acquired), right foot: Secondary | ICD-10-CM

## 2018-07-10 DIAGNOSIS — M216X9 Other acquired deformities of unspecified foot: Secondary | ICD-10-CM

## 2018-07-10 NOTE — Progress Notes (Signed)
Adjustments made to f/o...added lift to Rt to make more level with left.

## 2018-07-11 NOTE — Progress Notes (Signed)
Subjective: 72 year old female presents the office today for concerns of left ankle pain.  She states that she like to have the peroneal tendon fixed on the left side however she is concerned about undergoing anesthesia again.  She states that she had some knee surgeries and anesthesia and she is concerned is affecting her memory and her brain.  She like to consider doing surgery under local anesthesia with a leg block on the left side.  She says the right foot is done very well and she is back to wearing a regular shoe when she is having no issues of the hammertoes. Denies any systemic complaints such as fevers, chills, nausea, vomiting. No acute changes since last appointment, and no other complaints at this time.   Objective: AAO x3, NAD DP/PT pulses palpable bilaterally, CRT less than 3 seconds Regard to the right foot the incisions are well-healed and the toes are in rectus position there is no significant discomfort of the fourth and fifth toes on the right foot.  There is no erythema increased warmth there is no drainage there is no signs of infection.  There is continuation of tenderness on the course the peroneal tendon just posterior and inferior to the medial malleolus of the left side worse than the right.  There is no subacute swelling on this.  There is no tenderness on the flexor tendons, posterior tibial tendon.  There is no area pinpoint tenderness.  No significant edema, erythema. No open lesions or pre-ulcerative lesions.  No pain with calf compression, swelling, warmth, erythema  Assessment: Healed surgical sites right foot, fourth and fifth hammertoes; peroneal tendon tear  Plan: -All treatment options discussed with the patient including all alternatives, risks, complications.  -Regards to surgery the right foot this is well-healed she is back to a regular shoe.  She is doing well she has no concerns.  I will discharge her from the postoperative care on the right foot. -Regards  to the left foot we discussed peroneal tendon repair.  She is having some cognitive issues and I want her to have this resolved before she undergoes any further anesthesia.  We will discuss with anesthesiology about possibly undergoing with just a leg block but she will likely have some kind of sedation.  Also encouraged her to follow-up with her primary care physician.  Discussed at some point to stop doing surgeries.  She is had several surgeries to her feet over the years. -Patient encouraged to call the office with any questions, concerns, change in symptoms.   Rebecca BarrackMatthew R Wagoner DPM

## 2018-08-02 DIAGNOSIS — Z981 Arthrodesis status: Secondary | ICD-10-CM | POA: Insufficient documentation

## 2018-08-24 ENCOUNTER — Ambulatory Visit: Admitting: Orthotics

## 2018-08-24 DIAGNOSIS — M2041 Other hammer toe(s) (acquired), right foot: Secondary | ICD-10-CM

## 2018-08-24 DIAGNOSIS — M216X9 Other acquired deformities of unspecified foot: Secondary | ICD-10-CM

## 2018-08-24 DIAGNOSIS — Z9889 Other specified postprocedural states: Secondary | ICD-10-CM

## 2018-08-24 DIAGNOSIS — M216X1 Other acquired deformities of right foot: Secondary | ICD-10-CM

## 2018-08-24 NOTE — Progress Notes (Signed)
Replaced topcover

## 2018-08-29 ENCOUNTER — Ambulatory Visit: Payer: 59 | Admitting: Orthotics

## 2018-08-29 DIAGNOSIS — M2041 Other hammer toe(s) (acquired), right foot: Secondary | ICD-10-CM

## 2018-08-29 DIAGNOSIS — M216X1 Other acquired deformities of right foot: Secondary | ICD-10-CM

## 2018-08-29 DIAGNOSIS — M216X9 Other acquired deformities of unspecified foot: Secondary | ICD-10-CM

## 2018-08-29 NOTE — Progress Notes (Signed)
Getting f/o remade to add more valgus wedging

## 2018-09-07 ENCOUNTER — Telehealth: Payer: Self-pay | Admitting: Podiatry

## 2018-09-07 NOTE — Telephone Encounter (Signed)
I was wondering if Dr. Ardelle Anton could contact the neurologist that has weighed me down on the type of anesthesia that can be used because of the problem with the anesthesia. If he could call back because Dr. Ardelle Anton and I had talked about him using a leg block with something else. I just wanted him to talk to Dr. Trinda Pascal and their phone number is [phone] and then call me back. My call back number is [phone].

## 2018-09-07 NOTE — Telephone Encounter (Signed)
I informed pt Dr. Ardelle Anton was out of the office and I would give him the message and call with his response.

## 2018-09-13 NOTE — Telephone Encounter (Signed)
I will talk with anesthesia at the surgery center again and will be in contact. Please let her know I will talk to them in the next week. Thanks.

## 2018-09-14 NOTE — Telephone Encounter (Signed)
Left message informing pt of Dr. Ardelle Anton 09/13/2018 12:15pm statement.

## 2018-09-21 ENCOUNTER — Ambulatory Visit: Payer: 59 | Admitting: Orthotics

## 2018-09-21 ENCOUNTER — Encounter: Payer: Self-pay | Admitting: Podiatry

## 2018-09-21 DIAGNOSIS — M2041 Other hammer toe(s) (acquired), right foot: Secondary | ICD-10-CM

## 2018-09-21 DIAGNOSIS — M216X9 Other acquired deformities of unspecified foot: Secondary | ICD-10-CM

## 2018-09-21 DIAGNOSIS — Z9889 Other specified postprocedural states: Secondary | ICD-10-CM

## 2018-09-21 DIAGNOSIS — M216X1 Other acquired deformities of right foot: Secondary | ICD-10-CM

## 2018-09-21 NOTE — Progress Notes (Signed)
Picked up adjusted f/o w/ added valgus wedging and k-wedge offload hallux R

## 2018-10-06 ENCOUNTER — Telehealth: Payer: Self-pay | Admitting: *Deleted

## 2018-10-06 NOTE — Telephone Encounter (Signed)
I attempted to call Dr. Langston Masker office to obtain their fax number.  I could not find a Dr. Trinda Pascal in the system.  I also called and informed Ms. Callicott that Dr. Ardelle Anton was sending a medical clearance request to Dr. Langston Masker.  She asked me to let Dr. Ardelle Anton know that she is going to see him on Tuesday, November 12.  She said to also thank Dr. Ardelle Anton for taking such good care of her.  She said she feels that people should be told how much they are appreciated and that she appreciates him.  I faxed the Medical clearance letter to Dr. Trena Platt, Ms. Chavero's Neurologist.

## 2018-11-10 ENCOUNTER — Ambulatory Visit: Payer: 59 | Admitting: Podiatry

## 2018-11-10 ENCOUNTER — Encounter: Payer: Self-pay | Admitting: Podiatry

## 2018-11-10 DIAGNOSIS — M216X9 Other acquired deformities of unspecified foot: Secondary | ICD-10-CM | POA: Diagnosis not present

## 2018-11-10 DIAGNOSIS — T148XXA Other injury of unspecified body region, initial encounter: Secondary | ICD-10-CM

## 2018-11-15 NOTE — Progress Notes (Signed)
Subjective: 72 year old female presents the office today and she wants to discuss possibly having surgery for peroneal tendon tears.  She states that she has had some issues with anesthesia we have had multiple discussions about.  She states that at times in regards to her ankle she gets some intermittent discomfort but does not last long and has been fluctuating.  She is not sure if she should proceed with surgery or if we should continue to watch and she was to have an "honest" discussion about this today. Denies any systemic complaints such as fevers, chills, nausea, vomiting. No acute changes since last appointment, and no other complaints at this time.   Objective: AAO x3, NAD DP/PT pulses palpable bilaterally, CRT less than 3 seconds Subjectively there is tenderness to palpation of the course of the peroneal tendons however there is no significant discomfort identified today and there is no edema, erythema.  Overall the peroneal tendons appear to be intact as well as the flexor, extensor tendons bilaterally.  There is cavus foot type present.  There is no area of pinpoint tenderness identified.  Dorsal spurring present off the midfoot.  No areas of edema.  Surgical sites the right fourth and fifth toe are healed. No open lesions or pre-ulcerative lesions.  No pain with calf compression, swelling, warmth, erythema  Assessment: Chronic peroneal tendon tear bilaterally  Plan: -All treatment options discussed with the patient including all alternatives, risks, complications.  -We long discussion regards to multiple treatment options.  She is had numerous surgeries for her feet.  She is actually even had 2 peroneal tendon repairs in the right side without any significant long-term improvement.  I doubt due to third surgeries can be beneficial for her given her foot type.  We discussed various options for her.  She is having minimal tenderness in the left side.  We discussed pros and cons of doing  surgery as well as just watching to see how she does.  After discussion elected not to proceed with surgery at this point and continue to monitor.  However we discussed that there is any increase in swelling or pain that we can always consider surgery in the future.  Also discussed that long-term the possibly of tendon rupture.  Continue orthotics for now. -Patient encouraged to call the office with any questions, concerns, change in symptoms.   Vivi BarrackMatthew R Wagoner DPM

## 2018-12-13 ENCOUNTER — Other Ambulatory Visit: Payer: 59 | Admitting: Orthotics

## 2018-12-14 ENCOUNTER — Ambulatory Visit (INDEPENDENT_AMBULATORY_CARE_PROVIDER_SITE_OTHER): Payer: 59

## 2018-12-14 ENCOUNTER — Ambulatory Visit (INDEPENDENT_AMBULATORY_CARE_PROVIDER_SITE_OTHER): Admitting: Podiatry

## 2018-12-14 ENCOUNTER — Telehealth: Payer: Self-pay | Admitting: Podiatry

## 2018-12-14 ENCOUNTER — Other Ambulatory Visit: Payer: Self-pay | Admitting: Podiatry

## 2018-12-14 ENCOUNTER — Encounter: Payer: Self-pay | Admitting: Podiatry

## 2018-12-14 DIAGNOSIS — Q6671 Congenital pes cavus, right foot: Secondary | ICD-10-CM | POA: Diagnosis not present

## 2018-12-14 DIAGNOSIS — M779 Enthesopathy, unspecified: Secondary | ICD-10-CM

## 2018-12-14 DIAGNOSIS — M7751 Other enthesopathy of right foot: Secondary | ICD-10-CM

## 2018-12-14 DIAGNOSIS — M79671 Pain in right foot: Secondary | ICD-10-CM

## 2018-12-14 NOTE — Telephone Encounter (Signed)
Pt was seen in office today and forgot to ask the Dr. if there were any types of injections she could get to help with her foot pain. Please give pt a call.

## 2018-12-14 NOTE — Telephone Encounter (Signed)
We can do a steroid injection submetatarsal 1 but I don't know if it will give long term improvement and I would not want to do many because she already doesn't have much fat pad. I do not recommend that she get an injection along the peroneal area.

## 2018-12-15 NOTE — Telephone Encounter (Signed)
Left message informing pt of Dr. Gabriel Rung statement of 12/14/2018 3:45pm.

## 2018-12-17 NOTE — Progress Notes (Signed)
Subjective: Rebecca Lewis presents today for concerns of right foot pain.  She is been doing more walking standing recently and she is developing pain submetatarsal 1 over the area of the callus as well.  She did this already yesterday new orthotics were ordered.  She states that this is been ongoing since she is been on her feet more but she denies any increase in swelling or redness and she denies any recent injury or trauma.  She still has some occasional discomfort of the ankles which is on her feet quite a bit but she denies any increase in swelling or pain. Denies any systemic complaints such as fevers, chills, nausea, vomiting. No acute changes since last appointment, and no other complaints at this time.   Objective: AAO x3, NAD DP/PT pulses palpable bilaterally, CRT less than 3 seconds There is plantarflexion of the first ray on bilateral feet with the right side worse than left and there is hyperkeratotic lesion underlying the sesamoids on the right foot due to the prominence of the bone atrophy that that could.  There is no straight erythema, drainage or pus there is no fluctuation palpitation any open sores.  Subjectively he still any discomfort in the course the peroneal tendons bilaterally.  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 No open lesions or pre-ulcerative lesions.  No pain with calf compression, swelling, warmth, erythema  Assessment: Capsulitis right first MPJ, metatarsalgia  Plan: -All treatment options discussed with the patient including all alternatives, risks, complications.  -X-rays were obtained reviewed.  No evidence of acute fracture or stress fracture.  Cavus foot type present. -She can repeat once she is getting submetatarsal 1 pain.  She is x-rayed the fat pad about avoid up on steroid injections as able.  She is getting get new orthotics that she was measured for.  We discussed offloading and padding for now.  We did discuss surgical intervention but she is had so many  surgeries she is having complications of anesthesia not to hold off any surgery if able.  She has very difficult foot type to manage.  She is in agreement to wean off on surgery for now. -In regards to the chronic peroneal tendon tears she is getting intermittent discomfort there is no increase in pain or swelling.  We will continue to monitor. -Patient encouraged to call the office with any questions, concerns, change in symptoms.   Vivi Barrack DPM

## 2019-01-17 ENCOUNTER — Ambulatory Visit: Payer: 59 | Admitting: Podiatry

## 2019-01-17 ENCOUNTER — Telehealth: Payer: Self-pay | Admitting: *Deleted

## 2019-01-17 ENCOUNTER — Encounter: Payer: Self-pay | Admitting: Podiatry

## 2019-01-17 DIAGNOSIS — T148XXA Other injury of unspecified body region, initial encounter: Secondary | ICD-10-CM | POA: Diagnosis not present

## 2019-01-17 DIAGNOSIS — M216X9 Other acquired deformities of unspecified foot: Secondary | ICD-10-CM | POA: Diagnosis not present

## 2019-01-17 NOTE — Patient Instructions (Signed)

## 2019-01-17 NOTE — Telephone Encounter (Signed)
-----   Message from Vivi Barrack, DPM sent at 01/17/2019  2:53 PM EST ----- Can you please order an MRI of the left ankle to evaluate tear worsening. She had it done last year and the pain is worse and wants surgery

## 2019-01-17 NOTE — Telephone Encounter (Signed)
Orders to J. Quintana, RN for pre-cert, faxed to [facility]. 

## 2019-01-18 ENCOUNTER — Ambulatory Visit: Payer: Self-pay | Admitting: Podiatry

## 2019-01-24 NOTE — Progress Notes (Signed)
Subjective: Rebecca Lewis presents the office today for surgical consultation.  She wants to discuss starting the left ankle she is having quite a bit of discomfort to the outside aspect, where she had the tear of the peroneal tendon.  This originally resulted she states from a fall and is been ongoing issue for about a year now.  Because she is very active he is on her feet a lot she was have this area fixed because is causing discomfort and we have attempted numerous conservative treatments including bracing, orthotics, anti-inflammatories made significant provement. Denies any systemic complaints such as fevers, chills, nausea, vomiting. No acute changes since last appointment, and no other complaints at this time.   Objective: AAO x3, NAD DP/PT pulses palpable bilaterally, CRT less than 3 seconds There is tenderness palpation on the course the peroneal tendon posterior inferior to the lateral malleolus on the left side.  There is no other area of tenderness identified to the left lower extremity.  No discomfort the same area on the right side.  Cavus foot type is present.  There is very minimal edema.  No erythema or warmth. No open lesions or pre-ulcerative lesions.  No pain with calf compression, swelling, warmth, erythema  Assessment: Chronic tear peroneal tendon left side  Plan: -All treatment options discussed with the patient including all alternatives, risks, complications.  -At this time we discussed multiple treatment options including both conservative as well as surgical options.  This time she was proceed with surgical intervention.  She will do this under nerve block only without anesthesia. -We will plan for left peroneal tendon repair however I like to recheck an MRI prior to surgery.  This was ordered of the left ankle today evaluate the tear.  It is been sometime since the MRI. -The incision placement as well as the postoperative course was discussed with the patient. I discussed risks  of the surgery which include, but not limited to, infection, bleeding, pain, swelling, need for further surgery, delayed or nonhealing, painful or ugly scar, numbness or sensation changes, over/under correction, recurrence, transfer lesions, further deformity, hardware failure, DVT/PE, loss of toe/foot. Patient understands these risks and wishes to proceed with surgery. The surgical consent was reviewed with the patient all 3 pages were signed. No promises or guarantees were given to the outcome of the procedure. All questions were answered to the best of my ability. Before the surgery the patient was encouraged to call the office if there is any further questions. The surgery will be performed at the [facility] on an outpatient basis. -Patient encouraged to call the office with any questions, concerns, change in symptoms.   Vivi Barrack DPM

## 2019-01-25 ENCOUNTER — Telehealth: Payer: Self-pay | Admitting: Podiatry

## 2019-01-25 NOTE — Telephone Encounter (Signed)
That should be fine to both.

## 2019-01-25 NOTE — Telephone Encounter (Signed)
[facility] is a blood [facility] on 3/5 that I'd like to do but I know I'm tentatively scheduled for surgery on 3/25, so is it okay if I donate my blood that day? Also, I know they said my orthotics are ready and I know Raiford Noble is out of town, but I wanted to know if I could my orthotics up on 3/5 in the morning because I have my MRI that morning. I would still keep my appointment with Raiford Noble on 3/17. Please call me back.

## 2019-01-25 NOTE — Telephone Encounter (Signed)
Left message informing pt of Dr.Wagoner's recommendation. 

## 2019-01-30 ENCOUNTER — Other Ambulatory Visit: Payer: Self-pay | Admitting: Podiatry

## 2019-02-01 ENCOUNTER — Ambulatory Visit
Admission: RE | Admit: 2019-02-01 | Discharge: 2019-02-01 | Disposition: A | Discharge status: Home / Self Care | Payer: 59 | Source: Ambulatory Visit | Attending: Podiatry | Admitting: Podiatry

## 2019-02-01 ENCOUNTER — Encounter: Payer: Self-pay | Admitting: *Deleted

## 2019-02-01 NOTE — Progress Notes (Signed)
Per Dr. Ardelle Anton, I sent a surgical medical clearance request letter to Dr. Abner Greenspan, Dr. Langston Masker, and Dr. Turner Daniels.

## 2019-02-13 ENCOUNTER — Encounter: Admitting: Orthotics

## 2019-02-13 ENCOUNTER — Telehealth: Payer: Self-pay | Admitting: *Deleted

## 2019-02-13 NOTE — Telephone Encounter (Signed)
"  I am calling to see if I am still a go for March 25.  I know changes are being made and they are limiting how many patients a day."  Yes, they are.  We may have to move you to April 1.  "That's fine, just let me know in advance so I can make arrangements."  I will let you know.  We did receive clearance from Dr. Jacinto Reap and he said for you to stop your herbs five days before your surgery and that you can resume two days after your surgery.  "So you have found out something already, huh?"  Yes, we're need to schedule you for April 1.  "I understand, it's better to be safe than sorry.  I suppose.  Thanks for letting me know in advance."

## 2019-02-21 ENCOUNTER — Telehealth: Payer: Self-pay | Admitting: *Deleted

## 2019-02-21 NOTE — Telephone Encounter (Signed)
"  If you could Vannie Hochstetler, call me back.  I'd appreciate it.  I hope you're taking care of yourself girl."  "I got to thinking and even my daughter said the same thing.  I know that the surgical center had called me about the surgery being on April 1st.  I think I want to postpone it until we get all this situation straightened out in regards to this Corona Virus.  I know the surgical area said they were going to call me Friday and let me know if they were still going to do surgery on the first.  Let's just go ahead and cancel it for now.  I'm not worried but my daughter said no mama postpone it.  Give me a call to let me know you got this message."  I called and informed Renee at the surgical center to cancel [name]'s surgery for February 28, 2019.   I am returning your call Mrs. Rebecca Lewis.  I will cancel your surgery.  "I just think it's a good idea to hold off on having the surgery especially since the mayor is on television telling us that we are going to have a lockdown."  Do you want to reschedule it?  "No, I'll call back later to reschedule it when all of this has died down."

## 2019-05-17 ENCOUNTER — Other Ambulatory Visit: Payer: Self-pay

## 2019-05-17 ENCOUNTER — Encounter: Payer: Self-pay | Admitting: Podiatry

## 2019-05-17 ENCOUNTER — Ambulatory Visit: Payer: 59 | Admitting: Podiatry

## 2019-05-17 ENCOUNTER — Ambulatory Visit: Admitting: Orthotics

## 2019-05-17 VITALS — Temp 96.7°F

## 2019-05-17 DIAGNOSIS — H539 Unspecified visual disturbance: Secondary | ICD-10-CM | POA: Insufficient documentation

## 2019-05-17 DIAGNOSIS — Q6671 Congenital pes cavus, right foot: Secondary | ICD-10-CM

## 2019-05-17 DIAGNOSIS — T148XXA Other injury of unspecified body region, initial encounter: Secondary | ICD-10-CM

## 2019-05-17 DIAGNOSIS — M216X1 Other acquired deformities of right foot: Secondary | ICD-10-CM

## 2019-05-17 DIAGNOSIS — M216X9 Other acquired deformities of unspecified foot: Secondary | ICD-10-CM | POA: Diagnosis not present

## 2019-05-17 DIAGNOSIS — M791 Myalgia, unspecified site: Secondary | ICD-10-CM | POA: Insufficient documentation

## 2019-05-17 NOTE — Patient Instructions (Signed)
Pre-Operative Instructions  Congratulations, you have decided to take an important step towards improving your quality of life.  You can be assured that the doctors and staff at [facility] will be with you every step of the way.  Here are some important things you should know:  1. Plan to be at the surgery center/hospital at least 1 (one) hour prior to your scheduled time, unless otherwise directed by the surgical center/hospital staff.  You must have a responsible adult accompany you, remain during the surgery and drive you home.  Make sure you have directions to the surgical center/hospital to ensure you arrive on time. 2. If you are having surgery at [facility] or [facility], you will need a copy of your medical history and physical form from your family physician within one month prior to the date of surgery. We will give you a form for your primary physician to complete.  3. We make every effort to accommodate the date you request for surgery.  However, there are times where surgery dates or times have to be moved.  We will contact you as soon as possible if a change in schedule is required.   4. No aspirin/ibuprofen for one week before surgery.  If you are on aspirin, any non-steroidal anti-inflammatory medications (Mobic, Aleve, Ibuprofen) should not be taken seven (7) days prior to your surgery.  You make take Tylenol for pain prior to surgery.  5. Medications - If you are taking daily heart and blood pressure medications, seizure, reflux, allergy, asthma, anxiety, pain or diabetes medications, make sure you notify the surgery center/hospital before the day of surgery so they can tell you which medications you should take or avoid the day of surgery. 6. No food or drink after midnight the night before surgery unless directed otherwise by surgical center/hospital staff. 7. No alcoholic beverages 24-hours prior to surgery.  No smoking 24-hours prior or 24-hours after  surgery. 8. Wear loose pants or shorts. They should be loose enough to fit over bandages, boots, and casts. 9. Don't wear slip-on shoes. Sneakers are preferred. 10. Bring your boot with you to the surgery center/hospital.  Also bring crutches or a walker if your physician has prescribed it for you.  If you do not have this equipment, it will be provided for you after surgery. 11. If you have not been contacted by the surgery center/hospital by the day before your surgery, call to confirm the date and time of your surgery. 12. Leave-time from work may vary depending on the type of surgery you have.  Appropriate arrangements should be made prior to surgery with your employer. 13. Prescriptions will be provided immediately following surgery by your doctor.  Fill these as soon as possible after surgery and take the medication as directed. Pain medications will not be refilled on weekends and must be approved by the doctor. 14. Remove nail polish on the operative foot and avoid getting pedicures prior to surgery. 15. Wash the night before surgery.  The night before surgery wash the foot and leg well with water and the antibacterial soap provided. Be sure to pay special attention to beneath the toenails and in between the toes.  Wash for at least three (3) minutes. Rinse thoroughly with water and dry well with a towel.  Perform this wash unless told not to do so by your physician.  Enclosed: 1 Ice pack (please put in freezer the night before surgery)   1 Hibiclens skin cleaner     Pre-op instructions  If you have any questions regarding the instructions, please do not hesitate to call our office.  [place]: [address], [place], [place] [address] -- [phone]  [place]: [address] [place], [place] [address] -- [phone]  [place]: [address]  [place], [place] [address] -- [phone]  [place]: [address], [address], [place], [place] [address] -- [phone]  Website:  [email or web address] 

## 2019-05-17 NOTE — Progress Notes (Signed)
Rebecca Lewis wanted me to check out her f/o; wanted to make sure the ones she received were offloading correctly.

## 2019-05-23 NOTE — Progress Notes (Signed)
Subjective: Keith presents the office today for surgical consultation for continued pain to the left ankle.  She unfortunately canceled her surgery due to the recent pandemic but at this point she wants to proceed with the surgery.  She states that she gets pain still the lateral aspect ankle in the same area that seems to be getting worse.  No significant swelling and no recent injury or trauma or any other changes.  She has tried orthotics, bracing, home physical therapy the anesthesia improvement. Denies any systemic complaints such as fevers, chills, nausea, vomiting. No acute changes since last appointment, and no other complaints at this time.   Objective: AAO x3, NAD DP/PT pulses palpable bilaterally, CRT less than 3 seconds There is tenderness palpation on the course the peroneal tendon posterior inferior to the lateral malleolus on the left side.  There is no other area of tenderness identified to the left lower extremity.  No discomfort the same area on the right side.  Cavus foot type is present.  There is very minimal edema.  No erythema or warmth.  Overall exam is unchanged. No pain to the medial ankle, specifically no pain in the course of the posterior tibial tendon. No open lesions or pre-ulcerative lesions.  No pain with calf compression, swelling, warmth, erythema  Assessment: Chronic tear peroneal tendon left side  Plan: -All treatment options discussed with the patient including all alternatives, risks, complications.  -Today we can discuss both conservative as well as surgical treatment options.  This time she was proceed with surgical intervention.  She will do this under nerve block only without anesthesia. -The incision placement as well as the postoperative course was discussed with the patient. I discussed risks of the surgery which include, but not limited to, infection, bleeding, pain, swelling, need for further surgery, delayed or nonhealing, painful or ugly scar,  numbness or sensation changes, over/under correction, recurrence, transfer lesions, further deformity, hardware failure, DVT/PE, loss of toe/foot. Patient understands these risks and wishes to proceed with surgery. The surgical consent was reviewed with the patient all 3 pages were signed. No promises or guarantees were given to the outcome of the procedure. All questions were answered to the best of my ability. Before the surgery the patient was encouraged to call the office if there is any further questions. The surgery will be performed at the [facility] on an outpatient basis. -Patient encouraged to call the office with any questions, concerns, change in symptoms.   Trula Tiedt DPM

## 2019-06-15 ENCOUNTER — Other Ambulatory Visit: Payer: Self-pay

## 2019-06-15 ENCOUNTER — Ambulatory Visit (INDEPENDENT_AMBULATORY_CARE_PROVIDER_SITE_OTHER): Admitting: Podiatry

## 2019-06-15 ENCOUNTER — Telehealth: Payer: Self-pay | Admitting: *Deleted

## 2019-06-15 ENCOUNTER — Ambulatory Visit (INDEPENDENT_AMBULATORY_CARE_PROVIDER_SITE_OTHER)

## 2019-06-15 DIAGNOSIS — M722 Plantar fascial fibromatosis: Secondary | ICD-10-CM

## 2019-06-15 DIAGNOSIS — G5751 Tarsal tunnel syndrome, right lower limb: Secondary | ICD-10-CM | POA: Diagnosis not present

## 2019-06-15 NOTE — Telephone Encounter (Signed)
That is fine. Thanks 

## 2019-06-15 NOTE — Telephone Encounter (Signed)
Pt states Dr. Jacqualyn Posey had wanted her to take an antiinflammatory and she has a prescription for Meloxicam from another doctor written in February of this year, she states she will take it for three days and call Dr. Jacqualyn Posey for further instructions.

## 2019-06-15 NOTE — Patient Instructions (Signed)
Tarsal Tunnel Syndrome Rehab Ask your health care provider which exercises are safe for you. Do exercises exactly as told by your health care provider and adjust them as directed. It is normal to feel mild stretching, pulling, tightness, or discomfort as you do these exercises. Stop right away if you feel sudden pain or your pain gets worse. Do not begin these exercises until told by your health care provider. Stretching and range-of-motion exercises These exercises warm up your muscles and joints and improve the movement and flexibility of your foot. These exercises also help to relieve pain, numbness, and tingling. Gastrocnemius stretch, standing This exercise is also called a calf stretch. It stretches the muscles in the back of the lower leg (gastrocnemius). 1. Stand with your hands against a wall. 2. Extend your left / right leg behind you, and bend your front knee slightly. Your heels should be on the floor. 3. Keeping your heels on the floor and your back knee straight, shift your weight toward the wall. Do not arch your back. You should feel a gentle stretch in the back of your lower leg (calf). 4. Hold this position for __________ seconds. 5. Return to the starting position. Repeat __________ times. Complete this exercise __________ times a day. Tibial nerve glide 1. Sit on a stable chair with both feet on the floor. 2. Clasp your hands together behind your back. Gently round your back and tuck your chin toward your chest. 3. Slowly straighten your knee as far as you can without increasing your symptoms. 4. Turn your left / right foot so that your toes are pointing outward. 5. Slowly tip your toes toward your shin. 6. Hold this position for __________ seconds. 7. Slowly return to the starting position. Repeat __________ times. Complete this exercise __________ times a day. Strengthening exercises These exercises build strength and endurance in your foot. Endurance is the ability to use  your muscles for a long time, even after they get tired. Plantar flexion with band  1. Sit on the floor with your left / right leg extended. 2. Loop a rubber exercise band or tube around the ball of your left / right foot. The ball of your foot is on the walking surface, right under your toes. The band or tube should be slightly tense when your foot is relaxed. 3. Hold the two ends of the band or tube in your hands. 4. Slowly point your toes downward, pushing them away from you (plantar flexion). Stop pushing your toes down if you have any pain. 5. Hold this position for __________ seconds. 6. Let the band or tube slowly pull your foot back to the starting position. Repeat __________ times. Complete this exercise __________ times a day. Ankle inversion with band 1. Secure one end of an exercise band or tubing to a fixed object, such as a table leg or a pole, that will stay still when the band is pulled. 2. Secure the other end of the band around your left / right foot, near your toes. 3. Sit on the floor, facing the fixed object. The band should be slightly tense when your foot is relaxed. 4. Make fists with your hands and put them between your knees. This will focus your strengthening at your ankle. 5. Leading with your big toe, slowly pull your banded foot inward, toward your body (inversion). The band or tube should be adding resistance. 6. Hold this position for __________ seconds. 7. Let the band or tube slowly pull your foot   back to the starting position. Repeat __________ times. Complete this exercise __________ times a day. Arch lifts This exercise strengthens the main muscles of your foot (foot intrinsics). 1. Sit in a chair with your feet flat on the floor. 2. Keeping your big toe and your heel on the floor, lift only your arch, which is on the inner edge of your left / right foot. Do not move your knee or scrunch your toes. This is a small movement. 3. Hold this position for  __________ seconds. 4. Slowly return to the starting position. Repeat __________ times. Complete this exercise __________ times a day. This information is not intended to replace advice given to you by your health care provider. Make sure you discuss any questions you have with your health care provider. Document Released: 11/15/2005 Document Revised: 03/06/2019 Document Reviewed: 03/06/2019 Elsevier Patient Education  2020 [employer]  

## 2019-06-15 NOTE — Progress Notes (Signed)
Subjective: 73 year old female presents the office today for concerns of numbness to her heel which is been ongoing for 1 to 1.5 months.  She denies any recent injury.  She states that she does not have any significant pain with standing, reports her heel down she starts to notice when burning, numbness.  She had no recent treatment for this.  She is also scheduled for left peroneal tendon repair next month.  She has a few questions in regards to the surgery. Denies any systemic complaints such as fevers, chills, nausea, vomiting. No acute changes since last appointment, and no other complaints at this time.   Objective: AAO x3, NAD DP/PT pulses palpable bilaterally, CRT less than 3 seconds On the right side there is a positive Tinel sign of the posterior tibial nerve.  Not able to elicit any area of pinpoint tenderness.  There is no pain with lateral compression of calcaneus.  There is no edema, erythema. No open lesions or pre-ulcerative lesions.  No pain with calf compression, swelling, warmth, erythema  Assessment: Concern for tarsal tunnel right  Plan: -All treatment options discussed with the patient including all alternatives, risks, complications.  -X-rays were obtained reviewed.  No evidence of acute fracture. -Discussed use of anti-inflammatories.  She has meloxicam at home or any use.  Also discussed rehab exercises for tarsal tunnel.  Ankle brace as well.  He can avoid surgery for this.  Symptoms continue order nerve conduction test. -Patient encouraged to call the office with any questions, concerns, change in symptoms.   Trula Egley DPM

## 2019-06-18 ENCOUNTER — Telehealth: Payer: Self-pay | Admitting: *Deleted

## 2019-06-18 NOTE — Telephone Encounter (Signed)
"  I wanted to call.  I saw Dr. Jacqualyn Posey and he was surprised that my surgery was scheduled all the way out to the end of August.  He didn't realize that that date was set all the way back at the end of June when I was there to see him.  Any way, He was going to try and see if he can move it up any sooner.  Of course I know you got to get approval for the surgery from the insurance.  So if it's not any sooner than two weeks from now.  I'm calling to see if there's a possibility because there's thing that I have to arrange.  If we have to stick to August 26 than that's okay.  Have a blessed rest of the day girl."

## 2019-06-21 ENCOUNTER — Telehealth: Payer: Self-pay

## 2019-06-21 NOTE — Telephone Encounter (Signed)
Pt called stating she has eye surgery on Aug 4th and wondering will that interfere with foot surgery scheduled for Aug 26th? "Will I have to re-schedule foot surgery?"

## 2019-06-22 NOTE — Telephone Encounter (Signed)
Left a message for the patient to call me back about the eye doctor. Lattie Haw

## 2019-06-22 NOTE — Telephone Encounter (Signed)
Called the patient and stated that she should be ok with the foot surgery since it is 3 to 4 weeks out but see how patient does at the eye surgery the next day as well and to call the office if any changes are made with the patient's eye surgery.Rebecca Lewis

## 2019-06-22 NOTE — Telephone Encounter (Signed)
Can you see who is doing her eye surgery and we will send them a note to make sure they are ok with it as well. Thanks.

## 2019-06-25 IMAGING — MR MR ANKLE*R* W/O CM
4 of 5 series · 15 of 40 positions shown · non-contrast
Comparison: None.

CLINICAL DATA: Bilateral ankle pain.  Ankle pain since [REDACTED].

EXAM:
MRI OF THE RIGHT ANKLE WITHOUT CONTRAST
TECHNIQUE: Multiplanar, multisequence MR imaging of the ankle was performed. No
intravenous contrast was administered.

[Series 4: PD fat-sat · axial · 4.0mm · 0.25mm/px · z∈[-75,+31]mm · 6 of 28 slices shown]
[im 1/28]
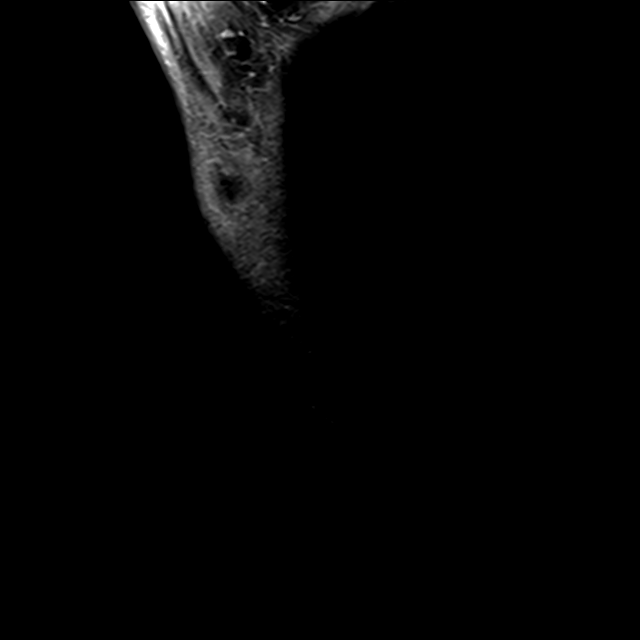
[im 4/28]
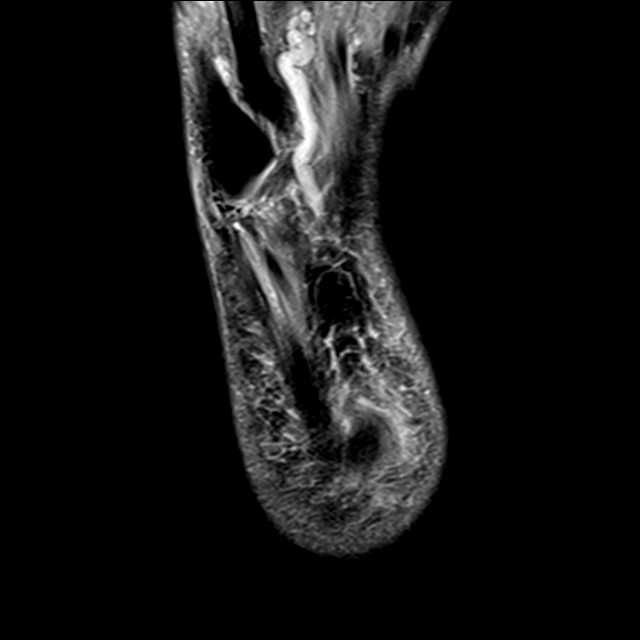
[im 8/28]
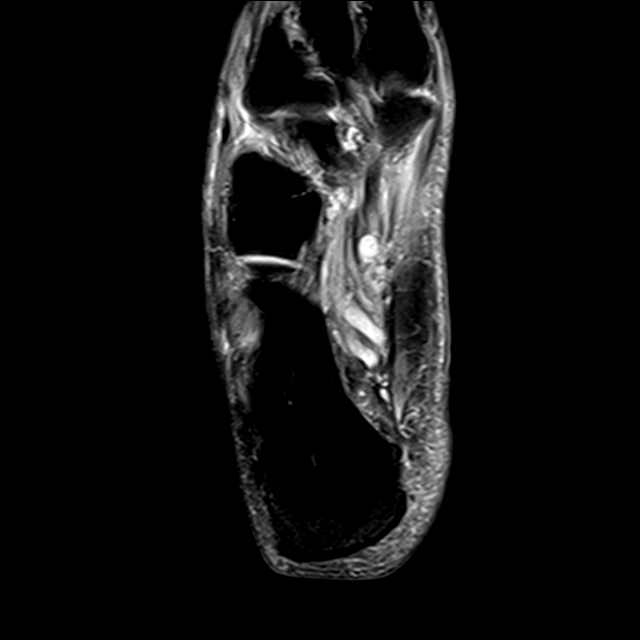
[im 12/28]
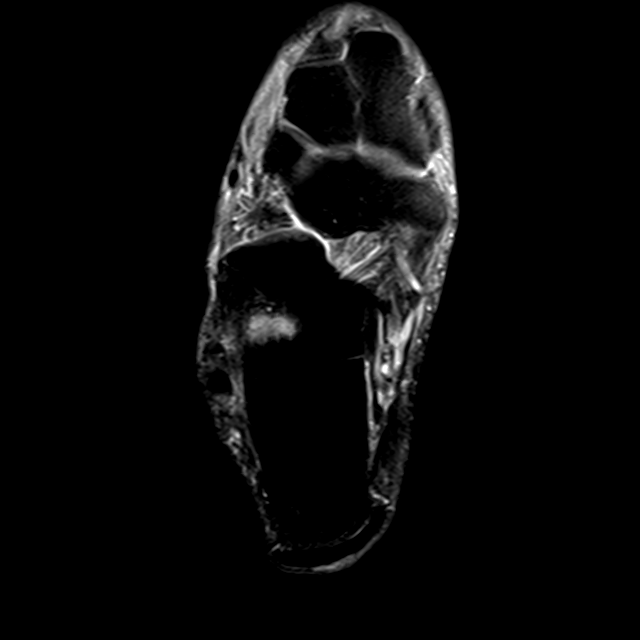
[im 16/28]
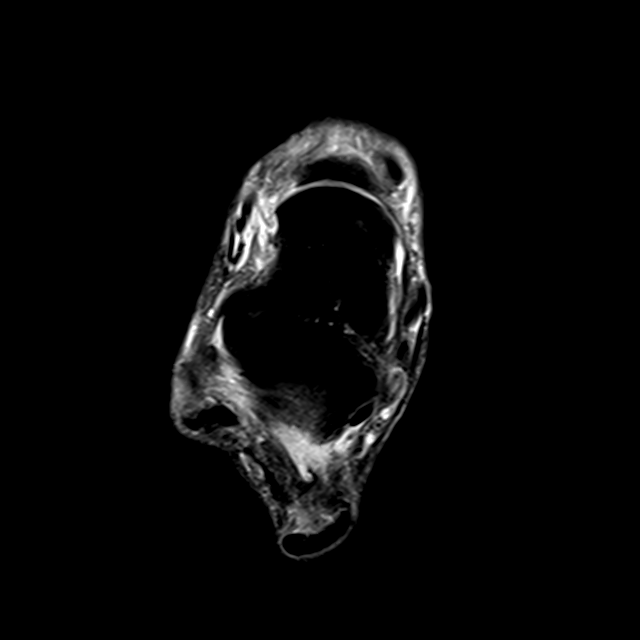
[im 24/28]
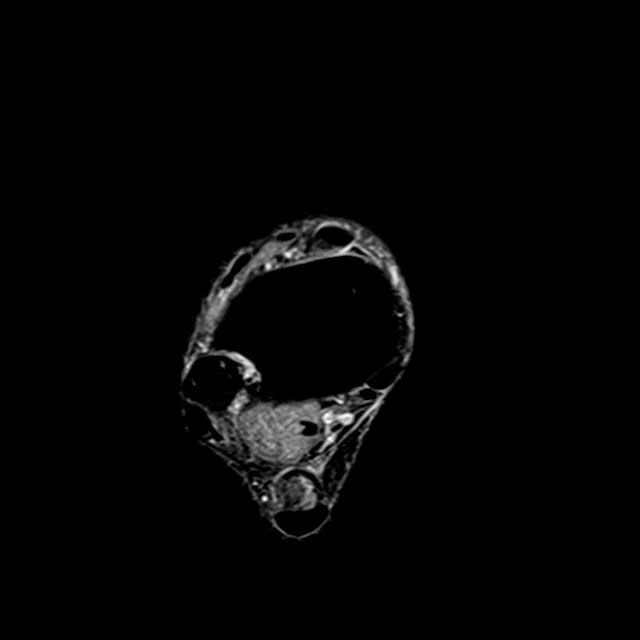

[Series 5: T2 fat-sat · axial · 4.0mm · 0.28mm/px · z∈[-61,+31]mm · 3 of 28 slices shown (1 of 3)]
[im 4/28]
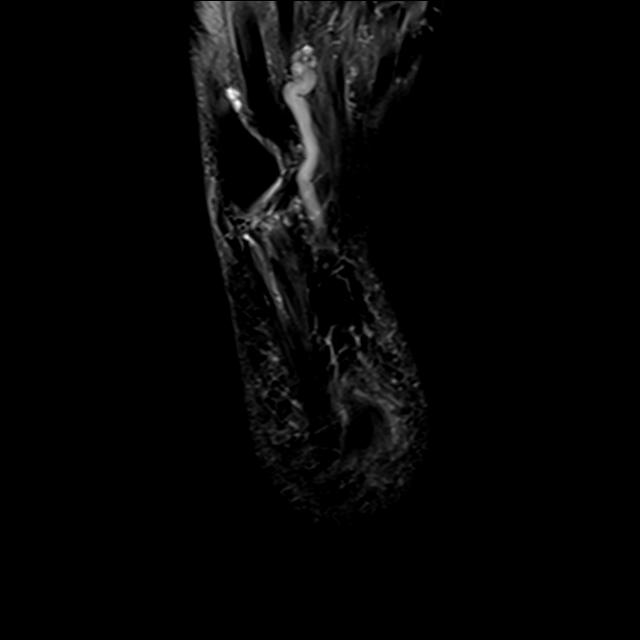
[im 14/28]
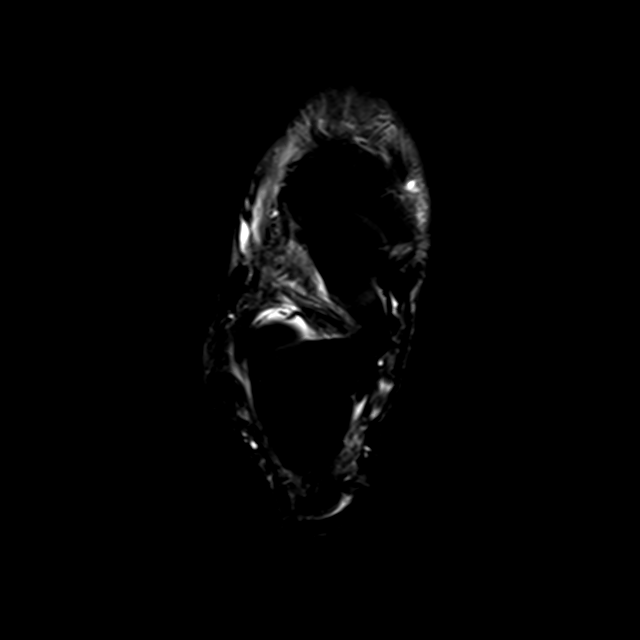
[im 24/28]
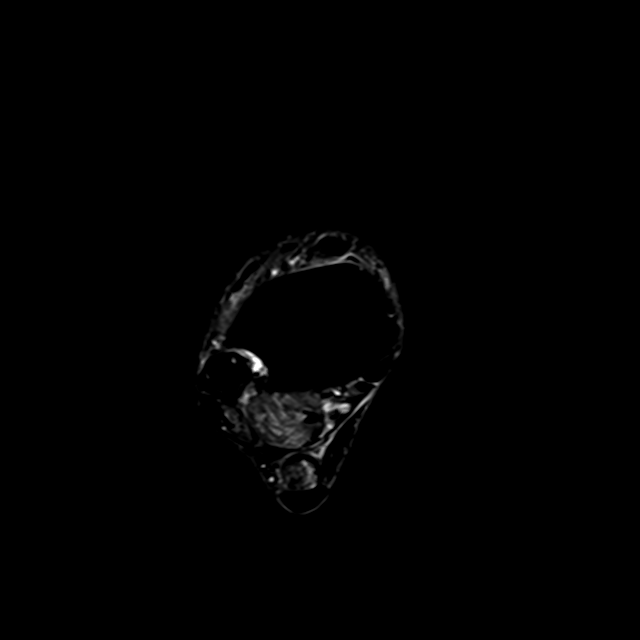

[Series 7: T2 fat-sat · sagittal · 3.0mm · 0.33mm/px · 3 of 24 slices shown (2 of 3)]
[im 4/24]
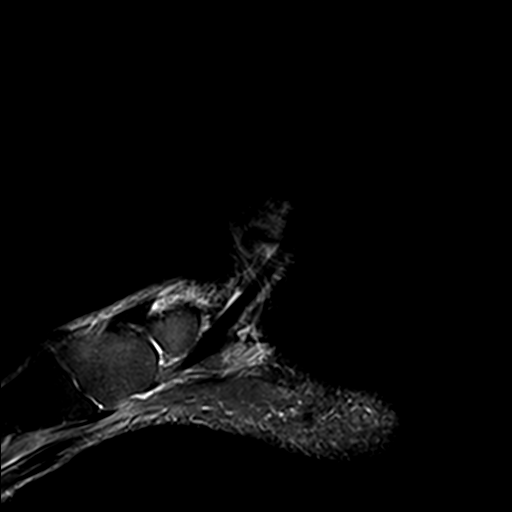
[im 14/24]
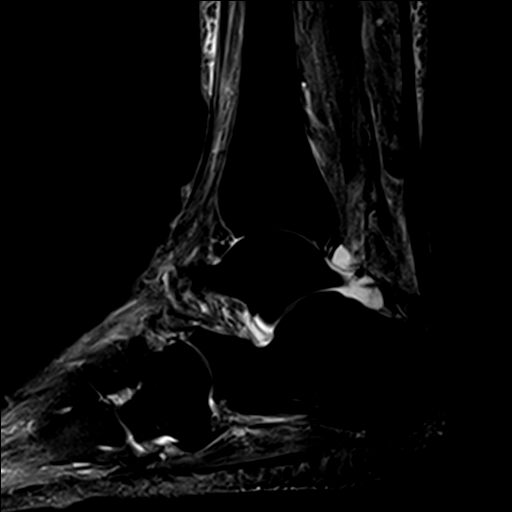
[im 20/24]
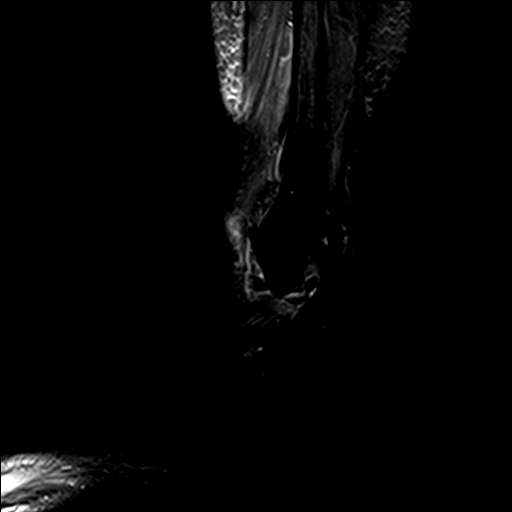

[Series 8: T2 fat-sat · coronal · 3.5mm · 0.35mm/px · 3 of 34 slices shown (3 of 3)]
[im 4/34]
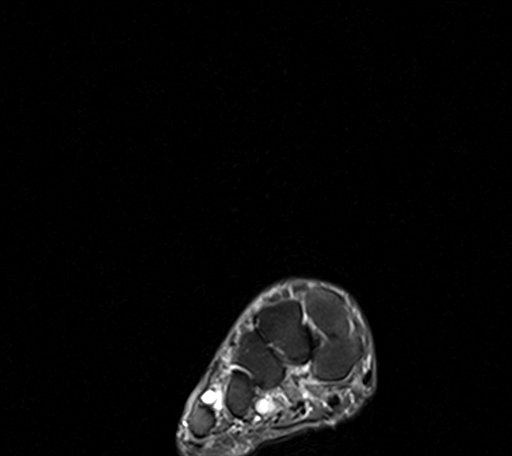
[im 17/34]
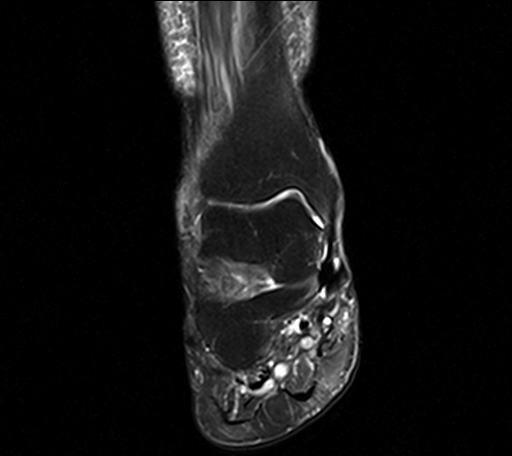
[im 30/34]
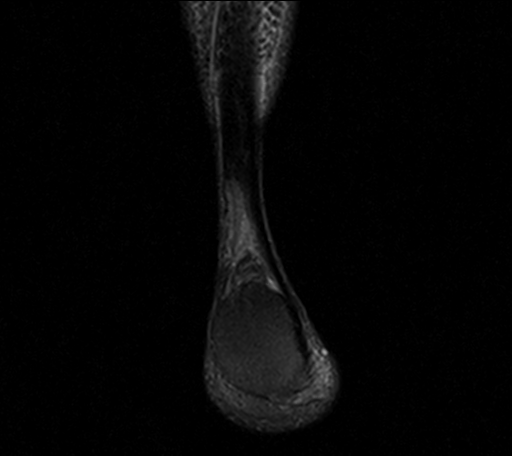

[15 of 40 positions shown; findings below may reference images not displayed]

FINDINGS: TENDONS

Peroneal: Peroneal longus tendon intact. Mild tendinosis of the
peroneus brevis with a short-segment longitudinal split tear.

Posteromedial: Posterior tibial tendon intact. Flexor hallucis
longus tendon intact. Flexor digitorum longus tendon intact.

Anterior: Tibialis anterior tendon intact. Extensor hallucis longus
tendon intact Extensor digitorum longus tendon intact.

Achilles:  Intact.

Plantar Fascia: Intact.

LIGAMENTS

Lateral: Anterior talofibular ligament intact. Calcaneofibular
ligament intact. Posterior talofibular ligament intact. Anterior and
posterior tibiofibular ligaments intact.

Medial: Deltoid ligament intact. Spring ligament intact.

CARTILAGE

Ankle Joint: No joint effusion. Normal ankle mortise. No chondral
defect.

Subtalar Joints/Sinus Tarsi: Normal subtalar joints. No subtalar
joint effusion. Normal sinus tarsi.

Bones: Mild osteoarthritis of the talonavicular joint. No acute
osseous abnormality.

Soft Tissue: 0.8 x 0.6 x 1 cm multiloculated cystic mass along the
dorsal aspect between the fourth and fifth metatarsals most
concerning for a ganglion cyst. Muscles are normal. Edema in Kager's
fat.
IMPRESSION: 1. Mild tendinosis of the peroneus brevis with a short-segment
longitudinal split tear.

## 2019-06-25 IMAGING — MR MR ANKLE*L* W/O CM
4 of 6 series · 15 of 40 positions shown · non-contrast
Comparison: None.

CLINICAL DATA: Bilateral ankle pain

EXAM:
MRI OF THE LEFT ANKLE WITHOUT CONTRAST
TECHNIQUE: Multiplanar, multisequence MR imaging of the ankle was performed. No
intravenous contrast was administered.

[Series 3: PD fat-sat · axial · 4.0mm · 0.28mm/px · z∈[-83,+42]mm · 6 of 28 slices shown]
[im 1/28]
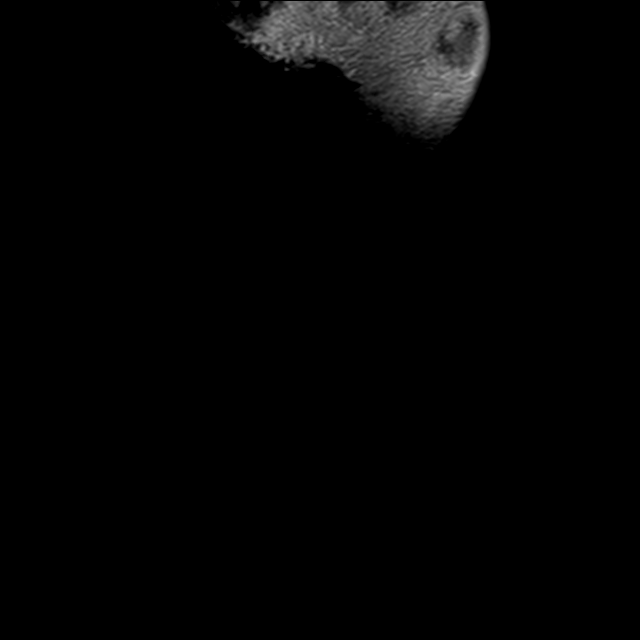
[im 6/28]
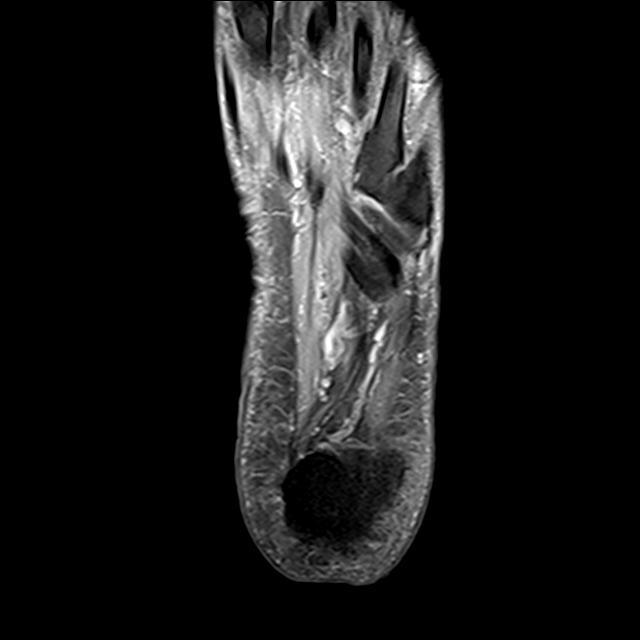
[im 11/28]
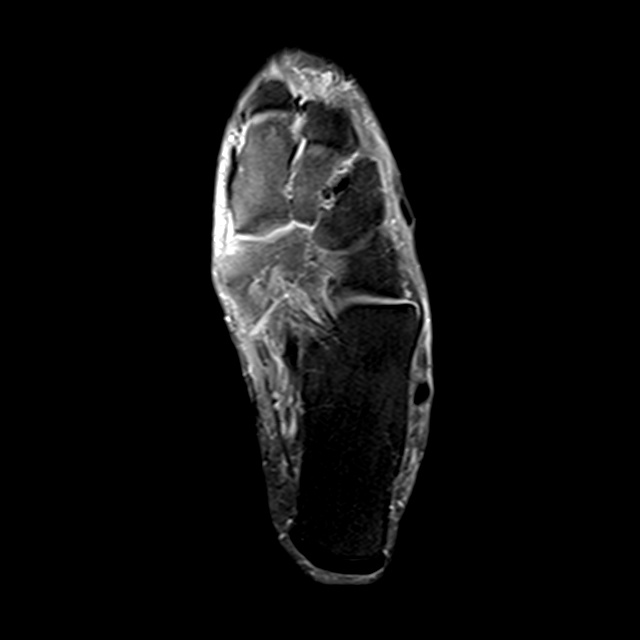
[im 17/28]
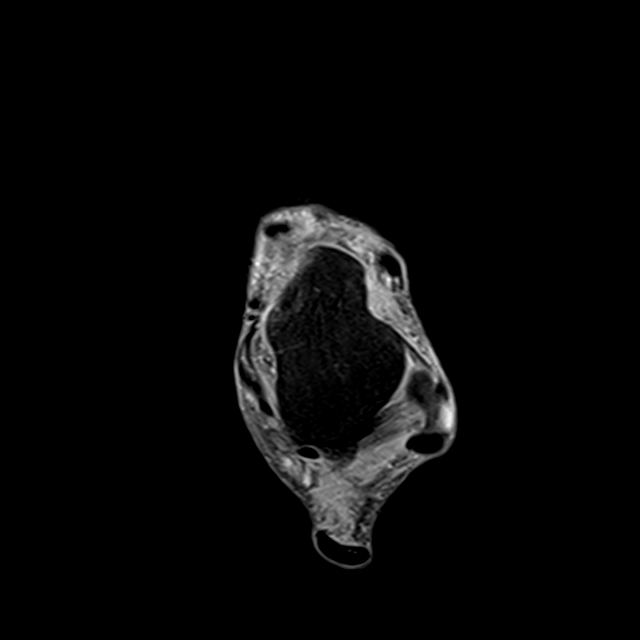
[im 22/28]
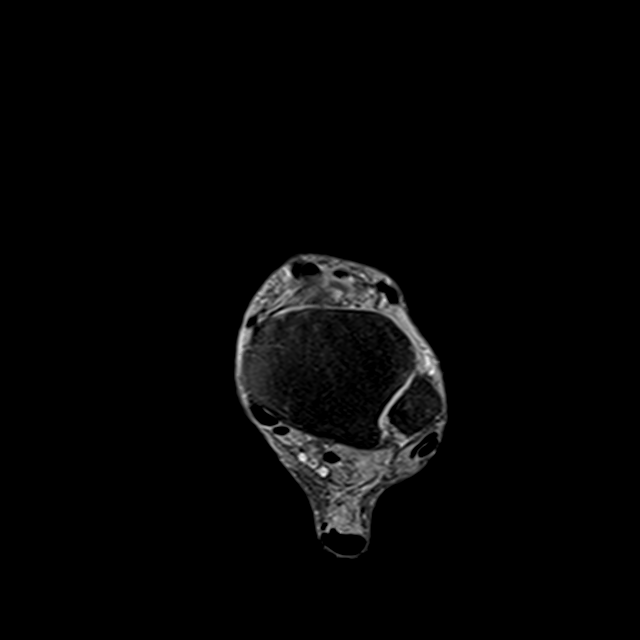
[im 28/28]
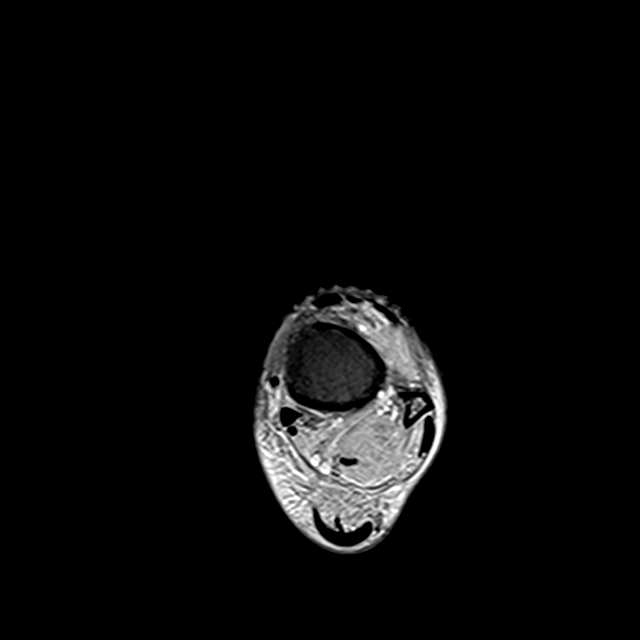

[Series 4: T2 fat-sat · axial · 4.0mm · 0.28mm/px · z∈[-60,+42]mm · 3 of 28 slices shown (1 of 3)]
[im 6/28]
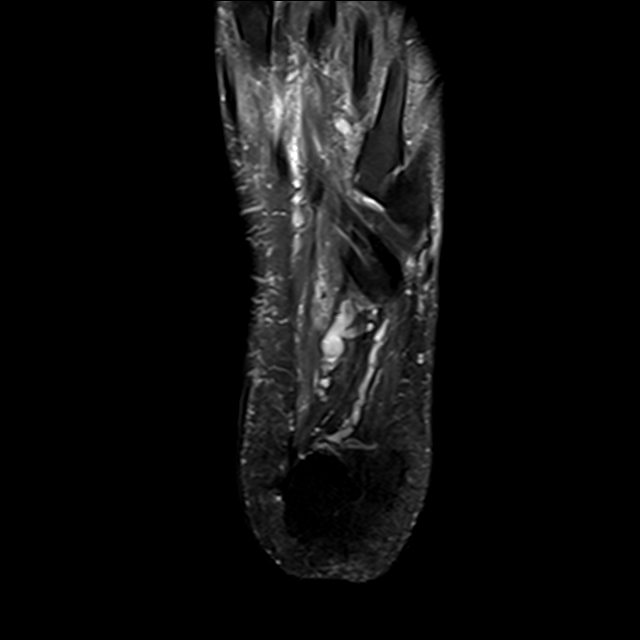
[im 17/28]
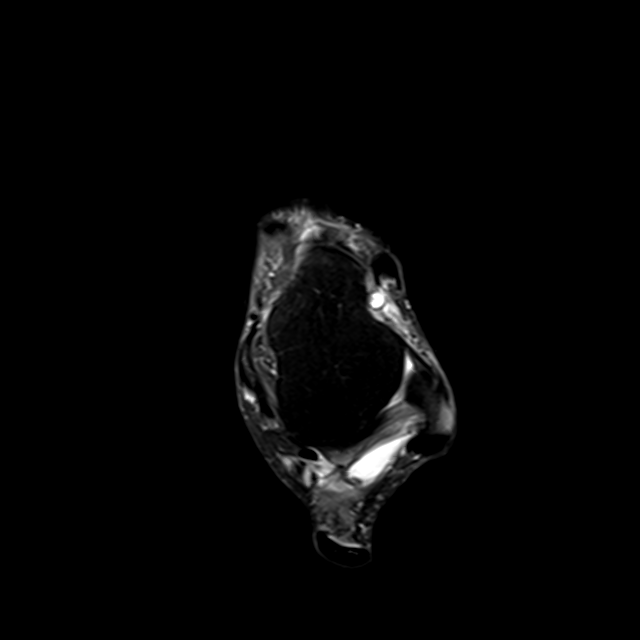
[im 28/28]
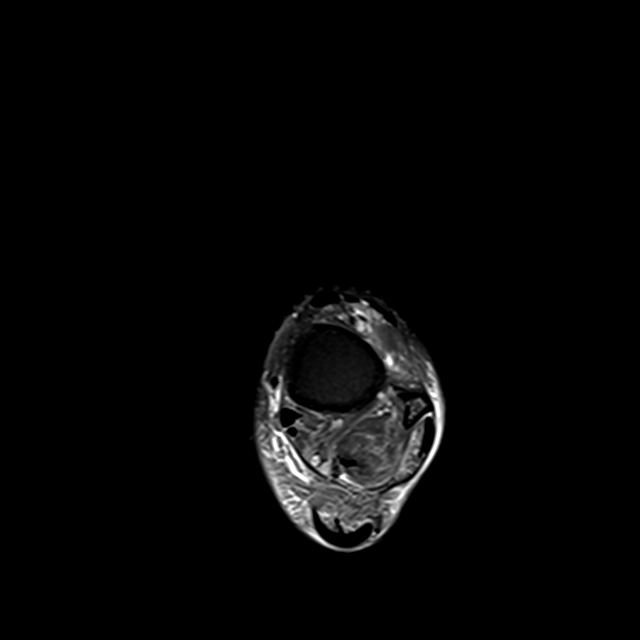

[Series 5: T2 fat-sat · sagittal · 3.5mm · 0.33mm/px · 3 of 20 slices shown (2 of 3)]
[im 1/20]
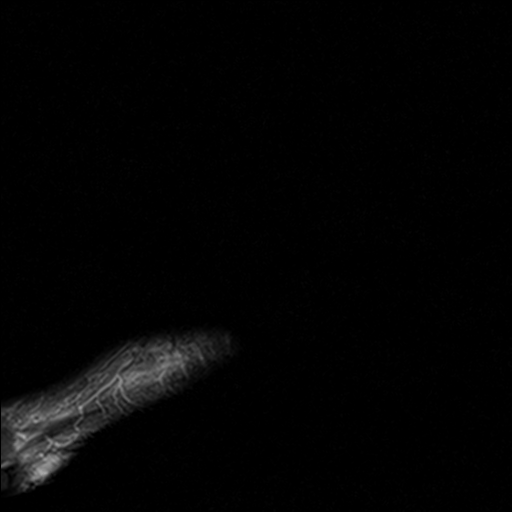
[im 10/20]
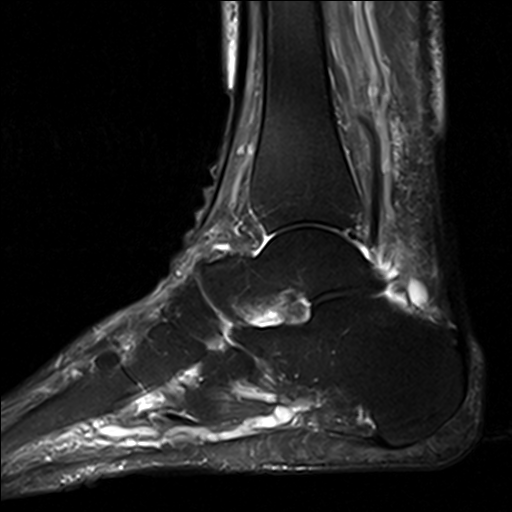
[im 20/20]
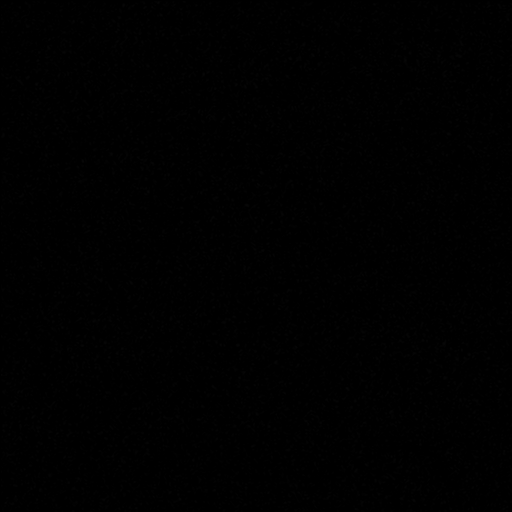

[Series 7: T2 fat-sat · coronal · 3.3mm · 0.35mm/px · 3 of 38 slices shown (3 of 3)]
[im 5/38]
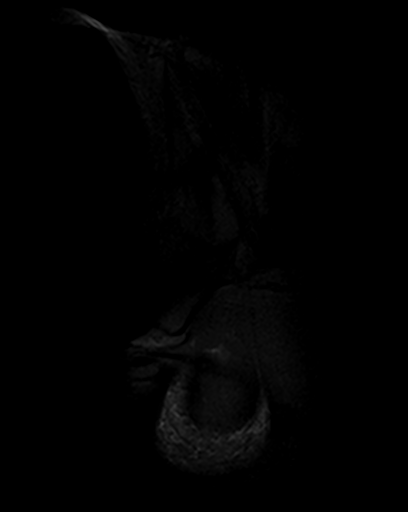
[im 19/38]
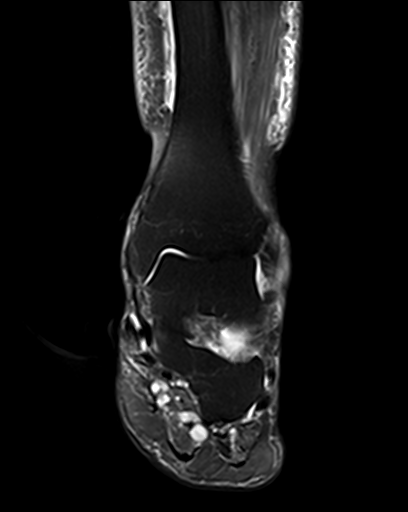
[im 33/38]
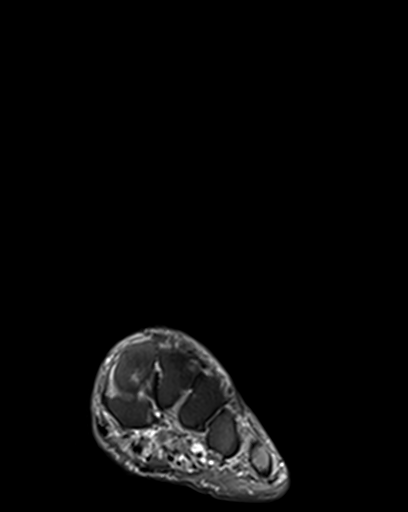

[15 of 40 positions shown; findings below may reference images not displayed]

FINDINGS: TENDONS

Peroneal: Peroneal longus tendon intact. Longitudinal split tear of
the peroneus brevis.

Posteromedial: Moderate tendinosis of the posterior tibial tendon.
Flexor hallucis longus tendon intact. Flexor digitorum longus tendon
intact.

Anterior: Tibialis anterior tendon intact. Extensor hallucis longus
tendon intact Extensor digitorum longus tendon intact.

Achilles:  Intact.

Plantar Fascia: Intact.

LIGAMENTS

Lateral: Anterior talofibular ligament intact. Calcaneofibular
ligament intact. Posterior talofibular ligament intact. Anterior and
posterior tibiofibular ligaments intact.

Medial: Deltoid ligament intact. Spring ligament intact.

CARTILAGE

Ankle Joint: No joint effusion. Normal ankle mortise. No chondral
defect.

Subtalar Joints/Sinus Tarsi: Subtalar joint effusion. Subtalar
joints are otherwise normal.

Bones: Small talonavicular joint effusion. Mild osteoarthritis of
the first TMT joint.

Soft Tissue: No fluid collection or hematoma.
IMPRESSION: 1. Moderate tendinosis of the posterior tibial tendon.
2. Longitudinal split tear of the peroneus brevis.

## 2019-06-26 NOTE — Telephone Encounter (Signed)
I left Rebecca Lewis a message that Dr. Jacqualyn Posey does not have any dates for surgery available before July 25, 2019.  I asked her to call if she has any further questions.

## 2019-06-29 ENCOUNTER — Encounter: Payer: Self-pay | Admitting: Podiatry

## 2019-06-29 ENCOUNTER — Ambulatory Visit (INDEPENDENT_AMBULATORY_CARE_PROVIDER_SITE_OTHER): Admitting: Podiatry

## 2019-06-29 ENCOUNTER — Other Ambulatory Visit: Payer: Self-pay

## 2019-06-29 VITALS — Temp 97.6°F

## 2019-06-29 DIAGNOSIS — M722 Plantar fascial fibromatosis: Secondary | ICD-10-CM | POA: Diagnosis not present

## 2019-06-29 NOTE — Progress Notes (Signed)
Subjective: 73 year old female presents the office with her right foot pain.  She is requesting steroid injection to the heel.  This is where she is majority of tenderness.  She states that she still gets some discomfort and pain that seems to be localized most of the heel.  No radiating pain proximally.  She has not been able to do the stretching exercises because of the peroneal tendon to hurt.  Denies any systemic complaints such as fevers, chills, nausea, vomiting. No acute changes since last appointment, and no other complaints at this time.   Objective: AAO x3, NAD DP/PT pulses palpable bilaterally, CRT less than 3 seconds Today there is a negative Tinel sign.  Majority of tenderness is on the plantar medial tubercle of the calcaneus at the insertion of plantar fascia.  Plantar fascial tissue intact.  No edema, erythema.  Achilles tendon intact.  No pain on the flexor tendons.  No open lesions or pre-ulcerative lesions.  No pain with calf compression, swelling, warmth, erythema  Assessment: Plantar fasciitis  Plan: -All treatment options discussed with the patient including all alternatives, risks, complications.  -Steroid injection performed today at her request.  See procedure note below.  Discussed gentle stretching exercises.  Discussed ice elevation.  Continue orthotics. -Patient encouraged to call the office with any questions, concerns, change in symptoms.   Trula Presswood DPM

## 2019-07-02 DIAGNOSIS — T8522XA Displacement of intraocular lens, initial encounter: Secondary | ICD-10-CM | POA: Insufficient documentation

## 2019-07-09 ENCOUNTER — Telehealth: Payer: Self-pay | Admitting: *Deleted

## 2019-07-09 NOTE — Telephone Encounter (Signed)
"  Rebecca Lewis, I know we have a scheduled surgery on the 26 of August.  I'm still dealing with this eye surgery that has not totally recovered.  I can't see me on a knee scooter with only one eye.  Plus I just don't know how often more I'll have to keep going to my eye doctor because the swelling still hasn't gone down plus the blood in my eye.  I think I'm going to cancel it.  Let's just tentatively make an appointment for next month.  See what Dr. Jacqualyn Posey says about that and let me know."

## 2019-07-10 NOTE — Telephone Encounter (Signed)
I am returning your call.  You want to cancel your surgery?  "Yes, I am sorry.  I just don't want to take a chance that pressure is still about 37% in that eye.  I wasn't expecting it to still be like this.  Here we go again."  It's okay.  I will cancel your surgery.  Take care of yourself.  I canceled the surgery scheduled for 07/25/2019 via the surgical center's [facility].

## 2019-08-16 DIAGNOSIS — M4802 Spinal stenosis, cervical region: Secondary | ICD-10-CM | POA: Insufficient documentation

## 2019-08-16 DIAGNOSIS — S233XXA Sprain of ligaments of thoracic spine, initial encounter: Secondary | ICD-10-CM | POA: Insufficient documentation

## 2019-09-24 ENCOUNTER — Telehealth: Payer: Self-pay | Admitting: *Deleted

## 2019-09-24 NOTE — Telephone Encounter (Signed)
"  I got sort of a question.  I don't know if you can talk to Dr. Jacqualyn Posey about this.  I know I have an appointment on November 10.  My concern is, I know we have had an MRI on this ankle that he wants to do surgery, that I have had to cancel twice already this year.  I'd like to get that surgery in before the end of the year because I don't know how insurance is going to feel about having to do another MRI again that's required because it's been so long since the last one.  I know if I come in on the tenth and we schedule an MRI and all that, it's going to put me later in the year.  I really feel I have to get this surgery done within this year.  I don't know if I can come in sooner.  I don't know how insurance is handling it now as far are they going to handle it without a current MRI again on that same foot.  I know it's a lot of questions.  Call me back.  I know if his schedule is that busy for me to just get in to see him, then who knows if I would have the chance of having surgery in the latter part of November or December for sure.  I hope it's not all confusing.  Give me a call."

## 2019-09-28 NOTE — Telephone Encounter (Signed)
I think it would be OK to hold off on another MRI if we did surgery in the next month or so especially if her pain has not changed. If it has worsened or new symptoms then I would recommend getting a new MRI. I am happy to have her come in this week to discuss.

## 2019-10-01 NOTE — Telephone Encounter (Signed)
"  Could you have Rebecca Lewis call me."  I am returning your call.  Dr. Jacqualyn Posey told me to tell you if your foot hasn't gotten any worse or if you haven't noticed any changes, then you do not need to have another MRI.  If it has worsened you will need another MRI.  "Well my foot is the same.  So, I'm glad to hear that I will not need another MRI."  He said he would like to see you this week to discuss it.  "I've already made an appointment for November 10 to see him.  I look forward to seeing him."

## 2019-10-09 ENCOUNTER — Other Ambulatory Visit: Payer: Self-pay

## 2019-10-09 ENCOUNTER — Ambulatory Visit: Payer: Self-pay | Admitting: Podiatry

## 2019-10-09 ENCOUNTER — Encounter: Payer: Self-pay | Admitting: Podiatry

## 2019-10-09 DIAGNOSIS — M216X9 Other acquired deformities of unspecified foot: Secondary | ICD-10-CM

## 2019-10-09 DIAGNOSIS — T148XXA Other injury of unspecified body region, initial encounter: Secondary | ICD-10-CM | POA: Diagnosis not present

## 2019-10-09 NOTE — Patient Instructions (Signed)
Pre-Operative Instructions  Congratulations, you have decided to take an important step towards improving your quality of life.  You can be assured that the doctors and staff at [facility] will be with you every step of the way.  Here are some important things you should know:  1. Plan to be at the surgery center/hospital at least 1 (one) hour prior to your scheduled time, unless otherwise directed by the surgical center/hospital staff.  You must have a responsible adult accompany you, remain during the surgery and drive you home.  Make sure you have directions to the surgical center/hospital to ensure you arrive on time. 2. If you are having surgery at [facility], you will need a copy of your medical history and physical form from your family physician within one month prior to the date of surgery. We will give you a form for your primary physician to complete.  3. We make every effort to accommodate the date you request for surgery.  However, there are times where surgery dates or times have to be moved.  We will contact you as soon as possible if a change in schedule is required.   4. No aspirin/ibuprofen for one week before surgery.  If you are on aspirin, any non-steroidal anti-inflammatory medications (Mobic, Aleve, Ibuprofen) should not be taken seven (7) days prior to your surgery.  You make take Tylenol for pain prior to surgery.  5. Medications - If you are taking daily heart and blood pressure medications, seizure, reflux, allergy, asthma, anxiety, pain or diabetes medications, make sure you notify the surgery center/hospital before the day of surgery so they can tell you which medications you should take or avoid the day of surgery. 6. No food or drink after midnight the night before surgery unless directed otherwise by surgical center/hospital staff. 7. No alcoholic beverages 24-hours prior to surgery.  No smoking 24-hours prior or 24-hours after  surgery. 8. Wear loose pants or shorts. They should be loose enough to fit over bandages, boots, and casts. 9. Don't wear slip-on shoes. Sneakers are preferred. 10. Bring your boot with you to the surgery center/hospital.  Also bring crutches or a walker if your physician has prescribed it for you.  If you do not have this equipment, it will be provided for you after surgery. 11. If you have not been contacted by the surgery center/hospital by the day before your surgery, call to confirm the date and time of your surgery. 12. Leave-time from work may vary depending on the type of surgery you have.  Appropriate arrangements should be made prior to surgery with your employer. 13. Prescriptions will be provided immediately following surgery by your doctor.  Fill these as soon as possible after surgery and take the medication as directed. Pain medications will not be refilled on weekends and must be approved by the doctor. 14. Remove nail polish on the operative foot and avoid getting pedicures prior to surgery. 15. Wash the night before surgery.  The night before surgery wash the foot and leg well with water and the antibacterial soap provided. Be sure to pay special attention to beneath the toenails and in between the toes.  Wash for at least three (3) minutes. Rinse thoroughly with water and dry well with a towel.  Perform this wash unless told not to do so by your physician.  Enclosed: 1 Ice pack (please put in freezer the night before surgery)   1 Hibiclens skin cleaner     Pre-op instructions  If you have any questions regarding the instructions, please do not hesitate to call our office.  [place]: [address], [place], [place] [address] -- [phone]  [place]: [address] [place], [place] [address] -- [phone]  [place]: [address]  [place], [place] [address] -- [phone]   Website: [email or web address] 

## 2019-10-09 NOTE — Progress Notes (Signed)
Subjective: 73 year old female presents the office with concerns of left ankle pain.  She had previously been scheduled for peroneal tendon repair given the pain as well as the MRI findings with longitudinal tear.  She had a reschedule her surgery because of eye issues.  Those seem to be resolving no time she was going proceed with tendon repair of the left ankle of the peroneal tendon due to ongoing pain.  She states that she has been swelling to the area as well as discomfort is having difficulty walking because the discomfort of the outside aspect of her ankle.  Otherwise she states that she has been doing well to her feet and no other concerns today.  Objective: AAO x3, NAD DP/PT pulses palpable bilaterally, CRT less than 3 seconds Right foot is much improved.  No significant pain or symptoms of the right side.  On the left side there is tenderness on the course the peroneal tendons and there is mild edema posterior to the lateral malleolus.  Overall the tendon appears to be intact.  MMT 5/5.  Cavus foot type is present.  No area pinpoint tenderness. The nails are hypertrophic, dystrophic with yellow-brown discoloration.  They are causing irritation given her eye issues she cannot see them to trim them. No pain with calf compression, swelling, warmth, erythema  MRI 02/01/2019: IMPRESSION: 1. Mild tendinosis with a longitudinal split tear of the distal peroneus brevis. 2. Mild tendinosis of the posterior tibial tendon with mild tenosynovitis.   Assessment: Left peroneal tendon tear  Plan: -All treatment options discussed with the patient including all alternatives, risks, complications.  -I did review the MRI with her.  Cussed with conservative as well as surgical treatment options.  At this time she wants to proceed with surgery to repair the left peroneal tendon.  Discussed the surgery as well as the postoperative course today. -The incision placement as well as the postoperative course  was discussed with the patient. I discussed risks of the surgery which include, but not limited to, infection, bleeding, pain, swelling, need for further surgery, delayed or nonhealing, painful or ugly scar, numbness or sensation changes, over/under correction, recurrence, transfer lesions, further deformity, hardware failure, DVT/PE, loss of toe/foot. Patient understands these risks and wishes to proceed with surgery. The surgical consent was reviewed with the patient all 3 pages were again signed today from the previous consent. No promises or guarantees were given to the outcome of the procedure. All questions were answered to the best of my ability. Before the surgery the patient was encouraged to call the office if there is any further questions. The surgery will be performed at the [facility] on an outpatient basis. -She has a cam boot as well as a knee scooter for postoperative use.  No follow-ups on file.  Trula Lampe DPM

## 2019-10-12 ENCOUNTER — Telehealth: Payer: Self-pay | Admitting: *Deleted

## 2019-10-12 NOTE — Telephone Encounter (Signed)
I attempted to call Mrs. Larue to schedule her surgery.  I left her a message to call me back.  "I'm sorry I missed your call.  Yes, I was waiting on you to call me to schedule.  When can he do it?  I'm not going anywhere for Thanksgiving so I can do it on October 22, 2019."  October 22, 2019 is fine.  I think that date is available.  I'm not sure if Dr. Jacqualyn Posey is doing surgery on that date but if there's a problem, I'll give you a call.  Someone from the surgical center will give you a call a day or two prior to your surgery date and will give you your arrival time.  "I guess I need to go online and register again but they should already have all my information."  Yes, you need to register.    "I was thinking, maybe I need to choose a different date because he's probably going to be out of town for the [place] and if I have any problems I may not be able to reach him."  A doctor will be on-call that you can speak with if needed.  "I love Dr. Jacqualyn Posey taking care of me because he knows me."  The next available date is going to be November 14, 2019.  "No, just leave me down for November 23.

## 2019-10-15 ENCOUNTER — Telehealth: Payer: Self-pay | Admitting: *Deleted

## 2019-10-15 NOTE — Telephone Encounter (Signed)
"  I have misplaced my brochure.  Do you have the link for the surgery center?"  Yes, go to [email or web address], click on pre-registration, then click on the [facility] link.  "Okay, thank you so much.  Was Dr. Jacqualyn Posey okay with that date, 11/25?"  As far as I know he is okay with it, I sent him the message of our last conversation.  "If he's not, please let me know.  My daughter is flying in so she can bring me for the surgery."  I will let you know.

## 2019-10-15 NOTE — Telephone Encounter (Signed)
I am fine doing the surgery as scheduled

## 2019-10-17 ENCOUNTER — Telehealth: Payer: Self-pay | Admitting: *Deleted

## 2019-10-17 NOTE — Telephone Encounter (Signed)
"  I am having surgery with Dr. Jacqualyn Posey on the 25th of this month.  I know I mentioned I had a boot but I don't know if it's the boot that I would put on after surgery since he's not putting a cast on.  I have one boot that is 12" tall from the heel to the top of the boot and I have another one that is 16.5" from the heel to the top of the boot.  It's grey with black straps, it seems like it's an older one that I would have had for a while.  I don't know if either of those will do.  I know the 12" one is short, it's black.  It's new I know.  If not then, just let me know that I don't need to bring either of those with me to the surgery center."  I left her a message that either boot is fine.  I told her to wear whichever one she feels is more comfortable.  T asked her to take the boot with her to the surgery center.

## 2019-10-22 ENCOUNTER — Telehealth: Payer: Self-pay | Admitting: *Deleted

## 2019-10-22 ENCOUNTER — Other Ambulatory Visit: Payer: Self-pay | Admitting: Podiatry

## 2019-10-22 MED ORDER — OXYCODONE-ACETAMINOPHEN 10-325 MG PO TABS: 1.0000 | ORAL_TABLET | Freq: Four times a day (QID) | ORAL | 0 refills | Status: DC | PRN

## 2019-10-22 MED ORDER — CEPHALEXIN 500 MG PO CAPS: 500.0000 mg | ORAL_CAPSULE | Freq: Three times a day (TID) | ORAL | 0 refills | Status: DC

## 2019-10-22 MED ORDER — PROMETHAZINE HCL 25 MG PO TABS
25.0000 mg | ORAL_TABLET | Freq: Three times a day (TID) | ORAL | 0 refills | Status: DC | PRN
Start: 2019-10-22 — End: 2020-09-23

## 2019-10-22 NOTE — Telephone Encounter (Signed)
sent 

## 2019-10-22 NOTE — Telephone Encounter (Signed)
Pt stated Dr. Jacqualyn Posey wanted her to call for her pain medication and the 10mg  work for her.

## 2019-10-22 NOTE — Progress Notes (Signed)
Post-op medications sent to the pharmacy for her surgery Wednesday.

## 2019-10-23 ENCOUNTER — Telehealth: Payer: Self-pay | Admitting: *Deleted

## 2019-10-23 NOTE — Telephone Encounter (Signed)
DOS 10/24/2019 REPAIR PERONEAL TENDON LEFT FOOT - [facility]: Eligibility Date - 11/29/2010  Co-insurance - 85% / 15% Deductible - $450 / met Out of pocket - $6500 / $2429.98 met   Pre-certification is NOT REQUIRED.  Medical review is not needed. Notification is required.  Call Ref # [id number]

## 2019-10-24 ENCOUNTER — Encounter: Payer: Self-pay | Admitting: Podiatry

## 2019-10-24 DIAGNOSIS — T148XXA Other injury of unspecified body region, initial encounter: Secondary | ICD-10-CM | POA: Diagnosis not present

## 2019-10-24 DIAGNOSIS — M216X2 Other acquired deformities of left foot: Secondary | ICD-10-CM | POA: Diagnosis not present

## 2019-10-29 ENCOUNTER — Other Ambulatory Visit: Payer: Self-pay | Admitting: Podiatry

## 2019-10-29 ENCOUNTER — Telehealth: Payer: Self-pay | Admitting: *Deleted

## 2019-10-29 MED ORDER — OXYCODONE-ACETAMINOPHEN 10-325 MG PO TABS: 1.0000 | ORAL_TABLET | Freq: Four times a day (QID) | ORAL | 0 refills | Status: DC | PRN

## 2019-10-29 NOTE — Telephone Encounter (Signed)
Pt states she had surgery with Dr. Jacqualyn Posey on Wednesday and would like to know how long she has to wear the compression gear on her calves, and would like a refill of the oxycodone 10mg .

## 2019-10-29 NOTE — Telephone Encounter (Signed)
I would wear it on the other calf for as long as she is not that mobile. I will refill the pain medication.

## 2019-10-30 NOTE — Telephone Encounter (Signed)
I informed pt of Dr. Leigh Aurora 10/29/2019 5:29pm orders, pt states understanding. Pt states after taking the keflex and stopping at Dr. Leigh Aurora request, she is having terrible reflux. I told pt that if she is not getting good results with the OTC reflux medications she should contact her PCP, as a temporary relief may try OTC Tums. Pt states she has been taking apple cider vinegar for the reflux. I told pt that may be too much acid and continue to keep the stomach and GI tract irritated and should stop the cider vinegar now. Pt states understanding.

## 2019-10-31 ENCOUNTER — Telehealth: Payer: Self-pay | Admitting: *Deleted

## 2019-10-31 NOTE — Telephone Encounter (Signed)
Called and left a message for the patient to call me back after having surgery on 10/24/2019 with Dr Jacqualyn Posey and also stated that we would see the patient tomorrow morning at 9:15 am and to call the office if any concerns or questions. Rebecca Lewis

## 2019-11-01 ENCOUNTER — Telehealth: Payer: Self-pay | Admitting: *Deleted

## 2019-11-01 ENCOUNTER — Ambulatory Visit (INDEPENDENT_AMBULATORY_CARE_PROVIDER_SITE_OTHER): Payer: 59 | Admitting: Podiatry

## 2019-11-01 ENCOUNTER — Encounter: Payer: Self-pay | Admitting: Podiatry

## 2019-11-01 ENCOUNTER — Other Ambulatory Visit: Payer: Self-pay

## 2019-11-01 DIAGNOSIS — T148XXA Other injury of unspecified body region, initial encounter: Secondary | ICD-10-CM

## 2019-11-01 NOTE — Telephone Encounter (Signed)
I told pt to open the boot and apply the ice to the surgery area and to make sur the skin was protected from the ice with a light cloth.

## 2019-11-01 NOTE — Telephone Encounter (Signed)
Pt called and asked if she should stuff the ice pack in her boot.

## 2019-11-02 NOTE — Progress Notes (Signed)
Subjective: Rebecca Lewis is a 73 y.o. is seen today in office s/p left peroneal tendon repair preformed on 10/24/2019. She states that her pain is controlled currently but she is still taking the pain medication but trying to space them out. She has been NWB in the boot but she does admit that she has been quite active and doing dishes and staying busy around the house. She also did have one fall. No other injury at the time and she was wearing the CAM boot.  Denies any systemic complaints such as fevers, chills, nausea, vomiting. No calf pain, chest pain, shortness of breath.   Objective: General: No acute distress, AAOx3  DP/PT pulses palpable 2/4, CRT < 3 sec to all digits.  Protective sensation intact. Motor function intact.  LEFT ankle: Incision is well coapted without any evidence of dehiscence with sutures and staples intact. There is no surrounding erythema, ascending cellulitis, fluctuance, crepitus, malodor, drainage/purulence. There is mild edema around the surgical site. There is minimal pain along the surgical site. Mild ecchymosis.  No other areas of tenderness to bilateral lower extremities.  No other open lesions or pre-ulcerative lesions.  No pain with calf compression, swelling, warmth, erythema.   Assessment and Plan:  Status post left ankle surgery, doing well with no complications   -Treatment options discussed including all alternatives, risks, and complications -Incisions appear to be healing well.  Antibiotic ointment and a dressing applied.  Keep the dressing clean, dry, intact. -Remain in cam boot and nonweightbearing.  Discussed importance of staying off her foot not be as active to help heal the surgical site. -Ice/elevation -Pain medication as needed. -Monitor for any clinical signs or symptoms of infection and DVT/PE and directed to call the office immediately should any occur or go to the ER. -Follow-up as scheduled for possible suture removal or sooner if any  problems arise. In the meantime, encouraged to call the office with any questions, concerns, change in symptoms.   Celesta Gentile, DPM

## 2019-11-05 ENCOUNTER — Other Ambulatory Visit: Payer: Self-pay | Admitting: Podiatry

## 2019-11-05 ENCOUNTER — Telehealth: Payer: Self-pay | Admitting: *Deleted

## 2019-11-05 MED ORDER — OXYCODONE-ACETAMINOPHEN 7.5-325 MG PO TABS: 1.0000 | ORAL_TABLET | Freq: Four times a day (QID) | ORAL | 0 refills | Status: DC | PRN

## 2019-11-05 NOTE — Telephone Encounter (Signed)
Pt called states she is needing a refill of the pain medication.

## 2019-11-05 NOTE — Telephone Encounter (Signed)
I sent percocet 7.5 to the pharmacy. We need to start decreasing. If she needs a different boot she can come in to get one.

## 2019-11-05 NOTE — Telephone Encounter (Signed)
Pt states if over does it she will go to sleep and she will get a sharp pain that wakes and the boot doesn't fit well, and the heel falls out when she rest or sleeps and that may be part of the pain problem even when she pads off the incision site, but she will discuss with Dr. Jacqualyn Posey at her next appt.

## 2019-11-06 ENCOUNTER — Other Ambulatory Visit: Payer: Self-pay | Admitting: Podiatry

## 2019-11-06 MED ORDER — OXYCODONE-ACETAMINOPHEN 10-325 MG PO TABS: 1.0000 | ORAL_TABLET | Freq: Four times a day (QID) | ORAL | 0 refills | Status: DC | PRN

## 2019-11-06 NOTE — Telephone Encounter (Signed)
[facility] - Rebecca Lewis states they only have the Percocet 10mg  on file at this time.

## 2019-11-06 NOTE — Telephone Encounter (Signed)
I called pt to inform pt of Dr. Leigh Aurora orders to pick up the boot if current boot fit poorly. Pt states she sent a message stating she had run out of the percocet 10/325mg  and had taken the 7.5mg  she had for her back and it lasted about 5.5 hours, because she still had 10mg  in her system, but the second time she took the 7.5mg  it did not last 3 hours and she had to take a second 7.5mg  percocet, so why would she decrease the dosage to have to take more percocet than the 10mg . I told pt not to pick up the 7.5mg  Percocet ordered yesterday, I would send a message to Dr. Jacqualyn Posey. Pt states she will see Dr. Jacqualyn Posey at her scheduled appt to get a different boot.

## 2019-11-06 NOTE — Telephone Encounter (Signed)
I sent the percocet 10/325 to the pharmacy. Can you please call and cancel the 7.5mg . We will need to decrease after this if we can.

## 2019-11-08 ENCOUNTER — Ambulatory Visit (INDEPENDENT_AMBULATORY_CARE_PROVIDER_SITE_OTHER): Payer: Self-pay | Admitting: Podiatry

## 2019-11-08 ENCOUNTER — Other Ambulatory Visit: Payer: Self-pay

## 2019-11-08 DIAGNOSIS — T148XXA Other injury of unspecified body region, initial encounter: Secondary | ICD-10-CM

## 2019-11-09 ENCOUNTER — Telehealth: Payer: Self-pay | Admitting: *Deleted

## 2019-11-09 NOTE — Telephone Encounter (Signed)
I called the patient to go over her concerns. She slept with the ice pack in her boot and this likely caused too much pressure. I told her not to do this and she can also loosen up the ACE bandage as this may be too tight causing irritation. Encouraged her to wear the boot at all times and ice/elevate. She had no further questions or concerns.

## 2019-11-09 NOTE — Telephone Encounter (Signed)
Pt states she is still having excruciating electrifying pain in the top of the foot and took one of the 7.5mg  Perocet and it eased off, pt states she does get some relief when up on the knee scooter. Pt states she has exchanged the boot, but feels like the boot is pressing on the top of the foot. Pt states Dr. Jacqualyn Posey might want to call her on this.

## 2019-11-12 ENCOUNTER — Other Ambulatory Visit: Payer: Self-pay | Admitting: Podiatry

## 2019-11-12 ENCOUNTER — Telehealth: Payer: Self-pay | Admitting: *Deleted

## 2019-11-12 MED ORDER — OXYCODONE-ACETAMINOPHEN 7.5-325 MG PO TABS: 1.0000 | ORAL_TABLET | Freq: Four times a day (QID) | ORAL | 0 refills | Status: DC | PRN

## 2019-11-12 NOTE — Telephone Encounter (Signed)
Pt states Dr. Jacqualyn Posey had made note to refill her pain medication today.

## 2019-11-12 NOTE — Telephone Encounter (Signed)
Refill pain medicine.  She says overall she is doing much better and her pain is improved.  We decreased the Percocet 7.5 today.

## 2019-11-15 NOTE — Progress Notes (Signed)
Subjective: Rebecca Lewis is a 73 y.o. is seen today in office s/p left peroneal tendon repair preformed on 10/24/2019.  Overall she states that she is doing well.  Her pain is controlled with the current pain medicine.  She has been trying to stay off of her feet more but she is still up and staying active.  She denies any recent injury or falls.  She denies any fevers, chills, nausea, vomiting.  No calf pain, chest pain, shortness of breath.  Objective: General: No acute distress, AAOx3  DP/PT pulses palpable 2/4, CRT < 3 sec to all digits.  Protective sensation intact. Motor function intact.  LEFT ankle: Incision is well coapted without any evidence of dehiscence with sutures and staples intact. There is no surrounding erythema, ascending cellulitis, fluctuance, crepitus, malodor, drainage/purulence. There is mild edema around the surgical site. There is minimal pain along the surgical site. Mild ecchymosis.  No other areas of tenderness to bilateral lower extremities.  No other open lesions or pre-ulcerative lesions.  No pain with calf compression, swelling, warmth, erythema.   Assessment and Plan:  Status post left ankle surgery, doing well with no complications   -Treatment options discussed including all alternatives, risks, and complications -Incisions appear to be healing well.  Sutures removed but staples remain intact.  Antibiotic ointment and a dressing applied.  Keep the dressing clean, dry, intact. -Remain in cam boot at all times and nonweightbearing.  Discussed importance of staying off her foot not be as active to help heal the surgical site. -Ice/elevation -Pain medication as needed. -Monitor for any clinical signs or symptoms of infection and DVT/PE and directed to call the office immediately should any occur or go to the ER. -Follow-up as scheduled for possible staple removal or sooner if any problems arise. In the meantime, encouraged to call the office with any questions,  concerns, change in symptoms.   Celesta Gentile, DPM

## 2019-11-16 ENCOUNTER — Telehealth: Payer: Self-pay | Admitting: *Deleted

## 2019-11-16 ENCOUNTER — Other Ambulatory Visit: Payer: Self-pay | Admitting: Podiatry

## 2019-11-16 MED ORDER — OXYCODONE-ACETAMINOPHEN 7.5-325 MG PO TABS: 1.0000 | ORAL_TABLET | Freq: Four times a day (QID) | ORAL | 0 refills | Status: DC | PRN

## 2019-11-16 NOTE — Telephone Encounter (Signed)
I spoke with pt states she is having to take 7.5mg  percocet overlapping and can last 7 hours, burning and electrifying pain.

## 2019-11-16 NOTE — Telephone Encounter (Signed)
done

## 2019-11-16 NOTE — Telephone Encounter (Signed)
Pt called requesting pain medication

## 2019-11-20 ENCOUNTER — Ambulatory Visit (INDEPENDENT_AMBULATORY_CARE_PROVIDER_SITE_OTHER): Payer: 59 | Admitting: Podiatry

## 2019-11-20 ENCOUNTER — Encounter: Payer: Self-pay | Admitting: Podiatry

## 2019-11-20 ENCOUNTER — Other Ambulatory Visit: Payer: Self-pay

## 2019-11-20 DIAGNOSIS — T148XXA Other injury of unspecified body region, initial encounter: Secondary | ICD-10-CM

## 2019-11-20 MED ORDER — OXYCODONE-ACETAMINOPHEN 5-325 MG PO TABS: 1.0000 | ORAL_TABLET | Freq: Four times a day (QID) | ORAL | 0 refills | Status: DC | PRN

## 2019-11-20 MED ORDER — IBUPROFEN 800 MG PO TABS: 800.0000 mg | ORAL_TABLET | Freq: Three times a day (TID) | ORAL | 0 refills | Status: DC | PRN

## 2019-11-26 ENCOUNTER — Other Ambulatory Visit: Payer: Self-pay | Admitting: Podiatry

## 2019-11-26 ENCOUNTER — Telehealth: Payer: Self-pay

## 2019-11-26 MED ORDER — OXYCODONE-ACETAMINOPHEN 5-325 MG PO TABS: 1.0000 | ORAL_TABLET | Freq: Four times a day (QID) | ORAL | 0 refills | Status: DC | PRN

## 2019-11-26 NOTE — Telephone Encounter (Signed)
Patient called for refill of oxycodone 5 mg, she only has 1 pill left. She is taking Ibuprofen every 8 hours as directed as well.  Please send in or advise. Thanks, [employer]

## 2019-11-26 NOTE — Telephone Encounter (Signed)
Sent percocet to the pharmacy.

## 2019-11-29 NOTE — Progress Notes (Signed)
Subjective: Rebecca Lewis is a 73 y.o. is seen today in office s/p left peroneal tendon repair preformed on 10/24/2019.  She states that overall she is doing well but she still having some swelling.  The pain is intermittent.  She is icing it and elevating.  Denies any recent injury or falls.  She still in the cam boot.  She has no new concerns today. She denies any fevers, chills, nausea, vomiting.  No calf pain, chest pain, shortness of breath.  Objective: General: No acute distress, AAOx3  DP/PT pulses palpable 2/4, CRT < 3 sec to all digits.  Protective sensation intact. Motor function intact.  LEFT ankle: Incision is well coapted without any evidence of dehiscence with staples intact. There is no surrounding erythema, ascending cellulitis, fluctuance, crepitus, malodor, drainage/purulence. There is mild edema around the surgical site however does appear to be somewhat improved compared to last appointment. There is minimal pain along the surgical site No other areas of tenderness to bilateral lower extremities.  No other open lesions or pre-ulcerative lesions.  No pain with calf compression, swelling, warmth, erythema.   Assessment and Plan:  Status post left ankle surgery, doing well with no complications   -Treatment options discussed including all alternatives, risks, and complications -Incisions appear to be healing well.  Staples removed and incision appears to be healing well.  Antibiotic ointment and a dressing applied.  Keep the dressing clean, dry, intact. -Remain in cam boot at all times and nonweightbearing.  Discussed importance of staying off her foot to allow for adequate healing. -Ice/elevation -Pain medication as needed. -Monitor for any clinical signs or symptoms of infection and DVT/PE and directed to call the office immediately should any occur or go to the ER. -Follow-up as scheduled or sooner if any problems arise. In the meantime, encouraged to call the office with any  questions, concerns, change in symptoms.   Celesta Gentile, DPM

## 2019-12-04 ENCOUNTER — Other Ambulatory Visit: Payer: Self-pay | Admitting: Podiatry

## 2019-12-04 ENCOUNTER — Ambulatory Visit (INDEPENDENT_AMBULATORY_CARE_PROVIDER_SITE_OTHER): Payer: 59 | Admitting: Podiatry

## 2019-12-04 ENCOUNTER — Ambulatory Visit (INDEPENDENT_AMBULATORY_CARE_PROVIDER_SITE_OTHER): Payer: 59

## 2019-12-04 ENCOUNTER — Other Ambulatory Visit: Payer: Self-pay

## 2019-12-04 ENCOUNTER — Telehealth: Payer: Self-pay | Admitting: *Deleted

## 2019-12-04 DIAGNOSIS — S99922A Unspecified injury of left foot, initial encounter: Secondary | ICD-10-CM | POA: Diagnosis not present

## 2019-12-04 DIAGNOSIS — M216X9 Other acquired deformities of unspecified foot: Secondary | ICD-10-CM

## 2019-12-04 DIAGNOSIS — M79672 Pain in left foot: Secondary | ICD-10-CM

## 2019-12-04 DIAGNOSIS — T148XXA Other injury of unspecified body region, initial encounter: Secondary | ICD-10-CM

## 2019-12-04 NOTE — Telephone Encounter (Signed)
I told pt and instructed pt to come in to get a larger surgery shoe, and to go back in to the surgery boot.

## 2019-12-04 NOTE — Telephone Encounter (Signed)
Pt states her new surgery shoe is too small.

## 2019-12-05 MED ORDER — IBUPROFEN 800 MG PO TABS: 800.0000 mg | ORAL_TABLET | Freq: Three times a day (TID) | ORAL | 0 refills | Status: DC | PRN

## 2019-12-05 NOTE — Telephone Encounter (Signed)
Pt states she will be by today for the refitting of the surgery shoe and Dr. Ardelle Anton was to order Ibuprofen but it is not at the pharmacy.

## 2019-12-05 NOTE — Telephone Encounter (Signed)
Left message informing pt the ibuprofen had been called to the [facility] [id number].

## 2019-12-05 NOTE — Addendum Note (Signed)
Addended by: Alphia Kava D on: 12/05/2019 10:23 AM   Modules accepted: Orders

## 2019-12-06 NOTE — Progress Notes (Signed)
Subjective: Rebecca Lewis is a 74 y.o. is seen today in office s/p left peroneal tendon repair preformed on 10/24/2019.  She does report that she had a fall on her scooter.  She was going over a ledge in her house and she fell off hitting her ankle but she was wearing the boot.  She also had another incident where she hit her toes while on the scooter but no significant increase in pain.  She states that she remove the bandage after the first incident and the incision was doing well and there is no opening or bleeding.  Her pain is controlled morning she is try to decrease the amount of narcotics that she is taking. She denies any fevers, chills, nausea, vomiting.  No calf pain, chest pain, shortness of breath.  Objective: General: No acute distress, AAOx3  DP/PT pulses palpable 2/4, CRT < 3 sec to all digits.  Protective sensation intact. Motor function intact.  LEFT ankle: Incision is well coapted without any evidence of dehiscence and eschar is forming.  Minimal edema.  There is no significant erythema there is no ascending cellulitis.  Is no drainage or pus or any clinical signs of infection.  No significant tenderness palpation of the surgical site.  There is no area of pinpoint tenderness specifically there is no pain of the fibula, tibia, talus, fifth metatarsal base or other areas of the foot.  Achilles tendon appears to be intact.   No other areas of tenderness to bilateral lower extremities.  No other open lesions or pre-ulcerative lesions.  No pain with calf compression, swelling, warmth, erythema.   Assessment and Plan:  Status post left ankle surgery, doing well with no complications   -Treatment options discussed including all alternatives, risks, and complications -X-ray taken reviewed due to injury.  No evidence of acute fracture. -Due to her falls as well as at this point from surgery she appears to be healing well.  We will start to transition to partial weightbearing in the cam boot  and then as tolerated weightbearing in the cam boot.  We discussed that she walks with a cam boot at all times. -Continue to ice and elevate -Pain medication as needed but hopefully she can start to wean off of this. -Will likely start PT next appointment  Return in about 2 weeks (around 12/18/2019).  Vivi Barrack DPM

## 2019-12-11 ENCOUNTER — Telehealth: Payer: Self-pay | Admitting: *Deleted

## 2019-12-11 NOTE — Telephone Encounter (Signed)
Pt states last week Dr. Ardelle Anton stated today she was to begin wearing the 15-4mmHg compression hose and is it okay to wear the one from the last surgery, should she wear it 24 hours 7 days a week, and continue the ice and elevation, but she is she to use the scar medication.

## 2019-12-12 NOTE — Telephone Encounter (Signed)
I would hold off on the scar cream for right now and just a small amount of antibiotic ointment. I want to make sure the incision is completely healed before starting the scar cream. She can use the compression but not at night. Continue to ice/elevate and TAKE IT EASY!

## 2019-12-12 NOTE — Telephone Encounter (Signed)
I informed pt of Dr. Gabriel Rung 12/12/2019 7:09am recommendations.

## 2019-12-18 ENCOUNTER — Ambulatory Visit (INDEPENDENT_AMBULATORY_CARE_PROVIDER_SITE_OTHER): Payer: 59 | Admitting: Podiatry

## 2019-12-18 ENCOUNTER — Encounter: Payer: Medicare Other | Admitting: Podiatry

## 2019-12-18 ENCOUNTER — Telehealth: Payer: Self-pay | Admitting: *Deleted

## 2019-12-18 ENCOUNTER — Other Ambulatory Visit: Payer: Self-pay

## 2019-12-18 DIAGNOSIS — M216X9 Other acquired deformities of unspecified foot: Secondary | ICD-10-CM

## 2019-12-18 DIAGNOSIS — T148XXA Other injury of unspecified body region, initial encounter: Secondary | ICD-10-CM

## 2019-12-18 NOTE — Telephone Encounter (Signed)
Pt states she would like to know how long she would be wearing the compression sock, and with the ankle brace which of the straps does she have to wear.

## 2019-12-19 ENCOUNTER — Telehealth: Payer: Self-pay | Admitting: *Deleted

## 2019-12-19 DIAGNOSIS — T148XXA Other injury of unspecified body region, initial encounter: Secondary | ICD-10-CM

## 2019-12-19 DIAGNOSIS — Z9889 Other specified postprocedural states: Secondary | ICD-10-CM

## 2019-12-19 NOTE — Telephone Encounter (Signed)
Faxed referral, and demographics to [facility] - A. Schallhorn, DPT.

## 2019-12-19 NOTE — Telephone Encounter (Signed)
She has a trilock brace. I would wear that anytime she is walking with a shoe for now. I would wear the compression sock during the day.

## 2019-12-20 NOTE — Telephone Encounter (Signed)
Left message informing pt the TriLock brace should have the a long strap that attaches with a velcro strap at the back of the main sock with fold down top, then take the strap from the outer portion of the ankle and come across the foot and do the same with the inner ankle strap. I told pt the compression sock should be worn beneath the trilock brace until she consistently has days without swelling. I told pt if she needed help with the trilock brace stop by.

## 2019-12-23 DIAGNOSIS — M216X9 Other acquired deformities of unspecified foot: Secondary | ICD-10-CM | POA: Insufficient documentation

## 2019-12-23 NOTE — Progress Notes (Signed)
Subjective: Rebecca Lewis is a 74 y.o. is seen today in office s/p left peroneal tendon repair preformed on 10/24/2019.  She presents today walking the cam boot.  She states that she did have a day where she went out to pick up at [employer] and she was now wearing her boot and she did twist her foot she felt the tendon stretch but she did not hear a pop and she did not fall.  She has not noticed any increase in swelling since the injury but she did have increased pain.  It seems to be improving.  She is asking if she can have a cam boot at this time. Denies any fevers, chills, nausea, vomiting.  No calf pain, chest pain, shortness of breath.  Objective: General: No acute distress, AAOx3  DP/PT pulses palpable 2/4, CRT < 3 sec to all digits.  Protective sensation intact. Motor function intact.  LEFT ankle: Incision is well coapted without any evidence of dehiscence and a scar has formed.  No significant discomfort today upon palpation to the surgical site.  There is minimal swelling.  There is no erythema or warmth.  Overall abdomen appears to be intact.  There is no evidence of discomfort.  Cavus foot type is present. No other open lesions or pre-ulcerative lesions.  No pain with calf compression, swelling, warmth, erythema.   Assessment and Plan:  Status post left ankle surgery  -Treatment options discussed including all alternatives, risks, and complications -Although she had the injury I do not believe that she tore the tendon at this time.  Since she seems to be doing better but transitioning to an ankle brace in a regular shoe discussed the gradual transition starting physical therapy.  Referral physical therapy displacement Tri-Lock ankle brace was dispensed for immobilization given surgery to help prevent any further injury at this time until we can do physical therapy.  On her continue to ice and elevate as well.  Compression anklet in 1 day.  Return in about 4 weeks (around  01/15/2020).  Vivi Barrack DPM

## 2020-01-09 ENCOUNTER — Telehealth: Payer: Self-pay | Admitting: Podiatry

## 2020-01-09 NOTE — Telephone Encounter (Signed)
I called the patient back to see how she is doing. She states that she has been doing physical therapy and she feels that she has over done it and feels that she has "stretched" the tendon last week. She states that she told the physical therapist. She has decreased the amount of exercise. She states that she can still walk but there is one area of tenderness on the surgical site. The rest of the foot feels fine.   She is not wearing the brace or boot. Brace is comfortable.   She also states that her brain has gone into a fog with the ibuprofen. She has stayed away from all other pain medication except for mobic and Voltaren gel. She also uses CBD cream. This is mostly for her knee.

## 2020-01-15 ENCOUNTER — Encounter: Payer: Self-pay | Admitting: Podiatry

## 2020-01-15 ENCOUNTER — Other Ambulatory Visit: Payer: Self-pay

## 2020-01-15 ENCOUNTER — Ambulatory Visit (INDEPENDENT_AMBULATORY_CARE_PROVIDER_SITE_OTHER): Payer: 59 | Admitting: Podiatry

## 2020-01-15 DIAGNOSIS — M779 Enthesopathy, unspecified: Secondary | ICD-10-CM

## 2020-01-15 DIAGNOSIS — T148XXA Other injury of unspecified body region, initial encounter: Secondary | ICD-10-CM

## 2020-01-21 NOTE — Progress Notes (Signed)
Subjective: Rebecca Lewis is a 74 y.o. is seen today in office s/p left peroneal tendon repair preformed on 10/24/2019.  She states that she is getting discomfort in the morning when she first gets up and she feels that the ankle is pulling.  She has been having some issues with her left knee as well but she wants to hold off on referral for knee specialist at this time.  She has been wearing a regular shoe.  She denies any recent falls.   She states that she started have memory issues again and she is to follow-up with neurology for this.  She is not taking any pain medicine at this time other than Tylenol as needed.  Denies any fevers, chills, nausea, vomiting.  No calf pain, chest pain, shortness of breath.  Objective: General: No acute distress, AAOx3  DP/PT pulses palpable 2/4, CRT < 3 sec to all digits.  Protective sensation intact. Motor function intact.  LEFT ankle: Incision is well coapted without any evidence of dehiscence and a scar has formed.  I am not able to identify any area of discomfort.  Overall peroneal tendon appears to be intact.  There is trace edema but there is no erythema or warmth.  There is no other areas of discomfort identified. No pain with calf compression, swelling, warmth, erythema.   Assessment and Plan:  Status post left ankle surgery  -Treatment options discussed including all alternatives, risks, and complications -I want her to continue physical therapy to work on rehab.  I dispensed a night splint to see if this will help with the morning stiffness.  Continue home range of motion, rehab exercises as well as icing daily. -She feels that she has overstretched her tendon and she is asked about a new MRI at what point we should do this.  Given the close proximity to her recent surgery to hold off on MRI as clinically does not appear to be torn but we will continue to monitor.  Also discussed need to hold off any further surgery.  Return in about 4 weeks (around  02/12/2020).  Vivi Barrack DPM

## 2020-02-12 ENCOUNTER — Encounter: Payer: Medicare Other | Admitting: Podiatry

## 2020-02-12 ENCOUNTER — Ambulatory Visit: Payer: Medicare Other | Admitting: Podiatry

## 2020-02-12 ENCOUNTER — Telehealth: Payer: Self-pay | Admitting: *Deleted

## 2020-02-12 ENCOUNTER — Encounter: Payer: Self-pay | Admitting: Podiatry

## 2020-02-12 ENCOUNTER — Other Ambulatory Visit: Payer: Self-pay

## 2020-02-12 DIAGNOSIS — M779 Enthesopathy, unspecified: Secondary | ICD-10-CM

## 2020-02-12 DIAGNOSIS — Z9889 Other specified postprocedural states: Secondary | ICD-10-CM

## 2020-02-12 DIAGNOSIS — T148XXA Other injury of unspecified body region, initial encounter: Secondary | ICD-10-CM

## 2020-02-12 NOTE — Telephone Encounter (Signed)
-----   Message from Vivi Barrack, DPM sent at 02/12/2020  9:16 AM EDT ----- Can you extend her PT for another 4 weeks? Thanks.

## 2020-02-12 NOTE — Progress Notes (Signed)
Subjective: Rebecca Lewis is a 74 y.o. is seen today in office s/p left peroneal tendon repair preformed on 10/24/2019.  She states that overall she is doing better.  Physical therapy has been very helpful and she was to continue therapy for additional 4 weeks.  She is back to her regular shoe.  She denies any recent injury or fall no increase in swelling.  Stiffness and occasional discomfort if she is on her feet too much but otherwise doing well.  Denies any fevers, chills, nausea, vomiting.  No calf pain, chest pain, shortness of breath.  She is not taking any medication for the ankle currently.  Objective: General: No acute distress, AAOx3  DP/PT pulses palpable 2/4, CRT < 3 sec to all digits.  Protective sensation intact. Motor function intact.  LEFT ankle: Incision is well coapted without any evidence of dehiscence and a scar has formed.  Overall strength appears to be intact.  MMT 5/5.  There is no significant edema there is no erythema or warmth.  Cavus foot type is present.  No other areas of discomfort identified at this time. No pain with calf compression, swelling, warmth, erythema.   Assessment and Plan:  Status post left ankle surgery  -Treatment options discussed including all alternatives, risks, and complications -Overall she seems to be improving.  Continue physical therapy for an additional 4 weeks.  New order was placed.  Continue wearing supportive shoes.  If she is doing a lot of walking discussed a Tri-Lock ankle brace but otherwise I want her to wean off the brace.  Continue ice elevate and as well as the compression anklet. -Monitor for any clinical signs or symptoms of infection and directed to call the office immediately should any occur or go to the ER.  Return in about 6 weeks (around 03/25/2020).  Vivi Barrack DPM

## 2020-02-12 NOTE — Telephone Encounter (Signed)
Faxed referral, clinicals and demographics to [facility].

## 2020-02-15 ENCOUNTER — Other Ambulatory Visit: Payer: Medicare Other | Admitting: Orthotics

## 2020-02-15 ENCOUNTER — Other Ambulatory Visit: Payer: Self-pay

## 2020-03-25 ENCOUNTER — Ambulatory Visit: Payer: Medicare Other | Admitting: Podiatry

## 2020-04-01 ENCOUNTER — Other Ambulatory Visit: Payer: Self-pay

## 2020-04-01 ENCOUNTER — Encounter: Payer: Self-pay | Admitting: Podiatry

## 2020-04-01 ENCOUNTER — Ambulatory Visit: Payer: Medicare Other | Admitting: Podiatry

## 2020-04-01 VITALS — Temp 97.7°F | Resp 14

## 2020-04-01 DIAGNOSIS — T148XXA Other injury of unspecified body region, initial encounter: Secondary | ICD-10-CM | POA: Diagnosis not present

## 2020-04-01 DIAGNOSIS — M216X9 Other acquired deformities of unspecified foot: Secondary | ICD-10-CM

## 2020-04-04 NOTE — Progress Notes (Addendum)
Subjective: Rebecca Lewis is a 74 y.o. is seen today in office s/p left peroneal tendon repair preformed on 10/24/2019.  States is better.  She still occasional discomfort but improved.  She still doing physical therapy which is been helpful.  She is back to more normal activities and she spent several hours in the gardening and has been on her feet quite a bit.  Some swelling residual after being on her feet but does fluctuate. Denies any fevers, chills, nausea, vomiting.  No calf pain, chest pain, shortness of breath.  She is not taking any medication for the ankle currently.  Objective: General: No acute distress, AAOx3  DP/PT pulses palpable 2/4, CRT < 3 sec to all digits.  Protective sensation intact. Motor function intact.  LEFT ankle: Incision is well coapted without any evidence of dehiscence and a scar has formed.  There is very slight tenderness outpatient the course of the surgical site.  There is trace edema there is no erythema or warmth.  Range of motion appears to be intact.  Cavus foot type is present.  No pain with calf compression, swelling, warmth, erythema.   Assessment and Plan:  Status post left ankle surgery  -Treatment options discussed including all alternatives, risks, and complications -Doing better and continue to improve.  She is back to more normal activities.  Continue physical therapy. She is scheduled until the end of May.  Continue with supportive shoes, inserts.  Return in about 2 months (around 06/01/2020), or if symptoms worsen or fail to improve.  Vivi Barrack DPM

## 2020-04-05 ENCOUNTER — Encounter: Payer: Self-pay | Admitting: Podiatry

## 2020-04-14 ENCOUNTER — Ambulatory Visit (INDEPENDENT_AMBULATORY_CARE_PROVIDER_SITE_OTHER): Admitting: Podiatry

## 2020-04-14 ENCOUNTER — Other Ambulatory Visit: Payer: Self-pay

## 2020-04-14 DIAGNOSIS — M722 Plantar fascial fibromatosis: Secondary | ICD-10-CM | POA: Diagnosis not present

## 2020-04-14 DIAGNOSIS — M79671 Pain in right foot: Secondary | ICD-10-CM | POA: Diagnosis not present

## 2020-04-14 NOTE — Patient Instructions (Signed)

## 2020-04-21 ENCOUNTER — Encounter: Payer: Self-pay | Admitting: Podiatry

## 2020-04-21 NOTE — Progress Notes (Signed)
Subjective: 74 year old female presents the office today for an unrelated issue of her surgery.  She is having pain to the bottom of her right heel which is been intermittent.  This is on the last 3 weeks.  She has tried meloxicam as well as Tylenol 8-hour arthritis.  She states that sitting and walking for period of time, this is adequate radiation.  She denies any recent injury.  No swelling.  No weakness or falls.  She states it feels differently than it did previously when she had a sharp pain.  Denies any systemic complaints such as fevers, chills, nausea, vomiting. No acute changes since last appointment, and no other complaints at this time.   Objective: AAO x3, NAD DP/PT pulses palpable bilaterally, CRT less than 3 seconds Cavus foot type is present.  There is mild tenderness on plantar medial tubercle of the calcaneus with insertion of plantar fascial there is fat pad atrophy present.  There is no pain about the rest of the calcaneus.  No pain with Achilles tendon.  Negative Tinel sign.  No pain with calf compression, swelling, warmth, erythema  Assessment: Right plantar fasciitis, atrophy heel fat pad  Plan: -All treatment options discussed with the patient including all alternatives, risks, complications.  -Recommended heel cups.  Discussed stretching, icing daily.  Continue with wearing supportive shoes, orthotics.  Continue meloxicam as needed. -Patient encouraged to call the office with any questions, concerns, change in symptoms.   Vivi Barrack DPM

## 2020-04-22 ENCOUNTER — Encounter: Payer: Self-pay | Admitting: Podiatry

## 2020-06-03 ENCOUNTER — Ambulatory Visit: Payer: Medicare Other | Admitting: Orthotics

## 2020-06-03 ENCOUNTER — Other Ambulatory Visit: Payer: Self-pay

## 2020-06-03 DIAGNOSIS — M779 Enthesopathy, unspecified: Secondary | ICD-10-CM

## 2020-06-03 DIAGNOSIS — M722 Plantar fascial fibromatosis: Secondary | ICD-10-CM

## 2020-06-03 DIAGNOSIS — M79671 Pain in right foot: Secondary | ICD-10-CM

## 2020-06-03 NOTE — Progress Notes (Signed)
Added heel cup

## 2020-06-10 ENCOUNTER — Other Ambulatory Visit: Payer: Self-pay

## 2020-06-10 ENCOUNTER — Ambulatory Visit (INDEPENDENT_AMBULATORY_CARE_PROVIDER_SITE_OTHER): Admitting: Podiatry

## 2020-06-10 ENCOUNTER — Encounter: Payer: Self-pay | Admitting: Podiatry

## 2020-06-10 DIAGNOSIS — M79671 Pain in right foot: Secondary | ICD-10-CM

## 2020-06-10 DIAGNOSIS — M722 Plantar fascial fibromatosis: Secondary | ICD-10-CM

## 2020-06-10 NOTE — Patient Instructions (Signed)

## 2020-06-11 NOTE — Progress Notes (Signed)
Subjective: 74 year old female presents the office today for follow-up evaluation of right heel pain, plantar fasciitis.  She states that she did feel better after the injection for about a month or so.  Dispensed a heel cups for her but she states it moves around her shoes causing discomfort.  She denies any recent injury.  Regards to the left side from surgery she has been doing well but she states that she has not been doing the exercises at home and she can tell a difference when she does not.  She did have a fall but did not injure her lower extremities except for her knees. Denies any systemic complaints such as fevers, chills, nausea, vomiting. No acute changes since last appointment, and no other complaints at this time.   Objective: AAO x3, NAD DP/PT pulses palpable bilaterally, CRT less than 3 seconds Incision on the left ankle is well-healed.  There is minimal tenderness palpation surgical site.  No significant swelling.  No erythema or warmth.  On the right side there is tenderness palpation on the plantar aspect of the calcaneus insertion of plantar fascia.  There is no pain with lateral compression of calcaneus.  There is no pain in the Achilles tendon.  Atrophy of the fat pad to the heel on the right side. No pain with calf compression, swelling, warmth, erythema  Assessment: Plantar fasciitis, atrophy heel fat pad right side; status post peroneal tendon repair left ankle  Plan: -All treatment options discussed with the patient including all alternatives, risks, complications.  -Regards to the right side Raiford Noble also saw her today modified her insert and put the heel lift underneath the insert to see if this will do.  Given the atrophy of the fat pad and her symptoms have improved to hold off on her injection today.  Discussed stretching, rehab exercises. -I want her to get back to doing rehab exercises left ankle daily. -Patient encouraged to call the office with any questions, concerns,  change in symptoms.   Vivi Barrack DPM

## 2020-06-30 ENCOUNTER — Ambulatory Visit: Payer: Medicare Other | Admitting: Podiatry

## 2020-07-10 ENCOUNTER — Encounter: Payer: Self-pay | Admitting: Podiatry

## 2020-07-10 NOTE — Telephone Encounter (Signed)
I called patient after reviewing her MyChart message. Patient has seen a chiropractor and she is putting more weight on her left than her right, up to 40 lbs difference.  She is back walking but not specific rehab exercises. She is asking if she should go back to the chiropractor to get new x-rays without her orthotics on (the original ones were with her standing with orthotics).    We discussed options. She is going to see a chiropractor most likely. Discussed orthopedic referral.

## 2020-07-22 DIAGNOSIS — M47894 Other spondylosis, thoracic region: Secondary | ICD-10-CM | POA: Insufficient documentation

## 2020-09-23 ENCOUNTER — Other Ambulatory Visit: Payer: Self-pay

## 2020-09-23 ENCOUNTER — Ambulatory Visit (INDEPENDENT_AMBULATORY_CARE_PROVIDER_SITE_OTHER): Admitting: Podiatry

## 2020-09-23 DIAGNOSIS — M722 Plantar fascial fibromatosis: Secondary | ICD-10-CM

## 2020-09-23 DIAGNOSIS — M7989 Other specified soft tissue disorders: Secondary | ICD-10-CM

## 2020-09-24 NOTE — Progress Notes (Signed)
Subjective: 74 year old female presents the office today for follow-up evaluation of bilateral heel pain, plantar fasciitis.  She has been going to a chiropractor and they were told her that her right hip is higher than her left.  They try to lift which cause left knee pain.  She denies any recent injury or falls.  She also feel that the orthotic on the right side she is still willing outwards.  Previously put a lateral wedge on the insert which helps some but is likely worn out.  She does get some ongoing swelling to the left lower extremity.  She has followed up with another physician for this but still having the symptoms.  Denies any calf pain, cramping. Denies any systemic complaints such as fevers, chills, nausea, vomiting. No acute changes since last appointment, and no other complaints at this time.   Objective: AAO x3, NAD DP/PT pulses palpable bilaterally, CRT less than 3 seconds Unable to elicit any significant area of tenderness bilaterally.  There is no pain along the plantar fascial today there is prominence of the calcaneal tuberosity.  Plantarflexed first ray resulting hyperkeratotic tissue submetatarsal 1.  There is chronic appearing edema to the left lower extremity compared to the right lower extremity.  No erythema or warmth.  No pain with calf compression, warmth.  Assessment: Bilateral heel pain, plantar fasciitis with cavus foot type; leg swelling  Plan: -All treatment options discussed with the patient including all alternatives, risks, complications.  -In regards to the leg swelling order venous reflux study. -For the foot pain she is good to bring the orthotic insert to try to add a lateral post.  We discussed physical therapy as well.  She is in a check to see by insurance about going back to therapy.  Continue home stretching, rehab daily.  Continue with supportive shoes. -Patient encouraged to call the office with any questions, concerns, change in symptoms.

## 2020-10-07 ENCOUNTER — Other Ambulatory Visit: Payer: Self-pay

## 2020-10-07 ENCOUNTER — Ambulatory Visit: Payer: Medicare Other | Admitting: Orthotics

## 2020-10-07 DIAGNOSIS — M7989 Other specified soft tissue disorders: Secondary | ICD-10-CM

## 2020-10-07 DIAGNOSIS — M722 Plantar fascial fibromatosis: Secondary | ICD-10-CM

## 2020-10-07 DIAGNOSIS — M216X9 Other acquired deformities of unspecified foot: Secondary | ICD-10-CM

## 2020-10-07 NOTE — Progress Notes (Signed)
Left ONLY f/o needs to be lenghtend

## 2020-10-14 ENCOUNTER — Telehealth: Payer: Self-pay | Admitting: *Deleted

## 2020-10-14 NOTE — Telephone Encounter (Signed)
[name]- can you please give her the number for [facility] and she can call and schedule? Thanks!

## 2020-10-14 NOTE — Telephone Encounter (Signed)
I called the patient and relayed the message per Dr Ardelle Anton and stated to call [facility] [place]-[phone]. [name]

## 2020-10-14 NOTE — Telephone Encounter (Signed)
Patient is calling for status of Vein/Vascular(venus reflux study mentioned in notes 09/23/20) order that was supposed to be completed 2 weeks ago. Please call Called [facility]elyn [facility]) said that they had tried to call to schedule twice, no answer. He will try to reach out to her again.Informed patient.

## 2020-10-16 ENCOUNTER — Telehealth: Payer: Self-pay | Admitting: Podiatry

## 2020-10-16 NOTE — Telephone Encounter (Signed)
Pt called stating she called [facility] and they didn't know anything about her calling, or what she was making an appointment for. Please advise.

## 2020-10-21 ENCOUNTER — Other Ambulatory Visit: Payer: Self-pay

## 2020-10-21 ENCOUNTER — Ambulatory Visit (HOSPITAL_COMMUNITY)
Admission: RE | Admit: 2020-10-21 | Discharge: 2020-10-21 | Disposition: A | Discharge status: Home / Self Care | Payer: POS | Source: Ambulatory Visit | Attending: Podiatry | Admitting: Podiatry

## 2020-10-21 ENCOUNTER — Telehealth: Payer: Self-pay | Admitting: Podiatry

## 2020-10-21 DIAGNOSIS — M7989 Other specified soft tissue disorders: Secondary | ICD-10-CM | POA: Diagnosis present

## 2020-10-21 NOTE — Telephone Encounter (Signed)
Pt left message stating she needed to r/s her appt for tomorrow. But she was leaving after hanging up the phone. I could call back today after 330 or 4 or go ahead and r/s appt to next week in the morning and leave that on the phone and if it did not work she would call to r/s.  I returned call and left message that I r/s appt to 12.2 @ 930am and to call if she needed to r/s.

## 2020-10-22 ENCOUNTER — Encounter: Payer: Medicare Other | Admitting: Orthotics

## 2020-10-30 ENCOUNTER — Other Ambulatory Visit: Payer: Self-pay

## 2020-10-30 ENCOUNTER — Ambulatory Visit: Payer: Medicare Other | Admitting: Orthotics

## 2020-10-30 DIAGNOSIS — M216X9 Other acquired deformities of unspecified foot: Secondary | ICD-10-CM

## 2020-10-30 DIAGNOSIS — M722 Plantar fascial fibromatosis: Secondary | ICD-10-CM

## 2020-10-30 NOTE — Progress Notes (Signed)
Dispensed f/o (lenghten) and added 1/8" lift L

## 2020-11-06 ENCOUNTER — Ambulatory Visit: Payer: Medicare Other | Admitting: Podiatry

## 2020-11-10 ENCOUNTER — Ambulatory Visit (INDEPENDENT_AMBULATORY_CARE_PROVIDER_SITE_OTHER): Admitting: Podiatry

## 2020-11-10 ENCOUNTER — Other Ambulatory Visit: Payer: Self-pay

## 2020-11-10 DIAGNOSIS — M722 Plantar fascial fibromatosis: Secondary | ICD-10-CM | POA: Diagnosis not present

## 2020-11-10 DIAGNOSIS — M216X9 Other acquired deformities of unspecified foot: Secondary | ICD-10-CM | POA: Diagnosis not present

## 2020-11-10 DIAGNOSIS — M779 Enthesopathy, unspecified: Secondary | ICD-10-CM

## 2020-11-10 DIAGNOSIS — M7989 Other specified soft tissue disorders: Secondary | ICD-10-CM | POA: Diagnosis not present

## 2020-11-15 DIAGNOSIS — H2701 Aphakia, right eye: Secondary | ICD-10-CM | POA: Insufficient documentation

## 2020-11-16 NOTE — Progress Notes (Signed)
Subjective: 73 year old female presents the office today for follow-up evaluation of bilateral foot and ankle pain.  She states that she has been practicing converting last appointment.  She does have concerns about when she is walking.  She had been followed with a chiropractor.  She has cavus foot type and she also states that she has other back issues and scoliosis and she is not sure how much that is contributing to her foot issues, gait. Denies any systemic complaints such as fevers, chills, nausea, vomiting. No acute changes since last appointment, and no other complaints at this time.   Objective: AAO x3, NAD DP/PT pulses palpable bilaterally, CRT less than 3 seconds Unable to elicit any significant area of tenderness bilaterally.  She still gets some intermittent discomfort along the peroneal tendon on the left side but no significant pain on the right side.  There is no pain on exam today.  There is no edema, erythema.  She still gets some discomfort on the plantar aspect of calcaneus but again no pain today.  There is no pain upon compression of calcaneus.  Cavus foot type is evident.  No erythema or warmth.  No pain with calf compression, warmth.  Assessment: Bilateral heel pain, plantar fasciitis with cavus foot type; tendinitis  Plan: -All treatment options discussed with the patient including all alternatives, risks, complications.  -Review the venous reflux study with the.  She is more compression socks. -Continue orthotics.  We will add a lateral post on her orthotic that she wears around the house of sleep this will help prevent her from rolling laterally. -I would like to get her into a gait lab if possible. We will work on this.   Vivi Barrack DPM    -In regards to the leg swelling order venous reflux study. -For the foot pain she is good to bring the orthotic insert to try to add a lateral post.  We discussed physical therapy as well.  She is in a check to see by  insurance about going back to therapy.  Continue home stretching, rehab daily.  Continue with supportive shoes. -Patient encouraged to call the office with any questions, concerns, change in symptoms.

## 2020-11-24 ENCOUNTER — Ambulatory Visit: Payer: Medicare Other | Admitting: Podiatry

## 2020-12-04 DIAGNOSIS — Z961 Presence of intraocular lens: Secondary | ICD-10-CM | POA: Insufficient documentation

## 2021-01-13 ENCOUNTER — Ambulatory Visit (INDEPENDENT_AMBULATORY_CARE_PROVIDER_SITE_OTHER): Admitting: Podiatry

## 2021-01-13 ENCOUNTER — Other Ambulatory Visit: Payer: Self-pay

## 2021-01-13 DIAGNOSIS — M7989 Other specified soft tissue disorders: Secondary | ICD-10-CM

## 2021-01-13 DIAGNOSIS — M216X9 Other acquired deformities of unspecified foot: Secondary | ICD-10-CM | POA: Diagnosis not present

## 2021-01-13 DIAGNOSIS — M779 Enthesopathy, unspecified: Secondary | ICD-10-CM | POA: Diagnosis not present

## 2021-01-13 DIAGNOSIS — M722 Plantar fascial fibromatosis: Secondary | ICD-10-CM

## 2021-01-13 MED ORDER — DICLOFENAC SODIUM 1 % EX GEL
2.0000 g | Freq: Four times a day (QID) | CUTANEOUS | 2 refills | Status: AC
Start: 2021-01-13 — End: ?

## 2021-01-17 NOTE — Progress Notes (Signed)
Subjective: 75 year old female presents the office today for follow-up evaluation of bilateral foot and ankle pain.  Since I last saw her heel is been applied to the left side and after getting used to it has been helping.  She is asking about getting a lift on the left foot for her other shoes that she wears at home.  She is still wondering how much of her symptoms to her feet could be coming from her back due to scoliosis.  No recent injury or falls or changes otherwise. Denies any systemic complaints such as fevers, chills, nausea, vomiting. No acute changes since last appointment, and no other complaints at this time.   Objective: AAO x3, NAD DP/PT pulses palpable bilaterally, CRT less than 3 seconds There is no significant tenderness to palpation bilaterally.  Cavus foot type is present.  She does get tenderness still to the plantar aspect of the heel on the right side.  There is no pain with lateral compression of calcaneus.  No pain with Achilles tendon.  No significant tenderness palpation on the peroneal tendons today.  MMT 5/5. No pain with calf compression, warmth.  Assessment: Bilateral heel pain, plantar fasciitis with cavus foot type; tendinitis  Plan: -All treatment options discussed with the patient including all alternatives, risks, complications.  -Compression socks recommended due to venous reflux study -Voltaren gel -She cannot bring in her other shoe with Rick of the heel at this this was helpful on the left side -Will look at referral to a gait lab ?[facility]   Vivi Barrack DPM

## 2021-01-23 ENCOUNTER — Ambulatory Visit: Payer: Medicare Other

## 2021-01-23 ENCOUNTER — Other Ambulatory Visit: Payer: Self-pay

## 2021-01-23 DIAGNOSIS — M722 Plantar fascial fibromatosis: Secondary | ICD-10-CM

## 2021-01-27 NOTE — Progress Notes (Signed)
Pt was seen today for a left heel adjustment. Heel lift were adjusted on her current ones.

## 2021-02-25 ENCOUNTER — Telehealth: Payer: Self-pay | Admitting: *Deleted

## 2021-02-25 NOTE — Telephone Encounter (Signed)
Patient is requesting a refill of the 800 Ibuprofen(has a outdated prescription). She is also wanting recommendations for a vein specialist since her leg continues to swell. Please advise.

## 2021-02-26 ENCOUNTER — Other Ambulatory Visit: Payer: Self-pay | Admitting: Podiatry

## 2021-02-26 DIAGNOSIS — I872 Venous insufficiency (chronic) (peripheral): Secondary | ICD-10-CM

## 2021-02-26 MED ORDER — IBUPROFEN 800 MG PO TABS: 800.0000 mg | ORAL_TABLET | Freq: Three times a day (TID) | ORAL | 0 refills | Status: DC | PRN

## 2021-02-26 NOTE — Telephone Encounter (Signed)
I have called her and refilled it and also put in a vein referral.

## 2021-02-26 NOTE — Progress Notes (Signed)
Refilled ibuprofen- she said she has been taking without issues  She is wearing compression which helps.   She is up to waling 3-4 miles a day and also doing strength training classes.

## 2021-02-27 NOTE — Telephone Encounter (Signed)
Called patient and left Vmessage that her medication has been sent to pharmacy([id number])on file and that referral has been sent.

## 2021-03-07 ENCOUNTER — Other Ambulatory Visit: Payer: Self-pay

## 2021-03-07 DIAGNOSIS — I83893 Varicose veins of bilateral lower extremities with other complications: Secondary | ICD-10-CM

## 2021-03-07 NOTE — Progress Notes (Signed)
v

## 2021-03-23 ENCOUNTER — Ambulatory Visit (INDEPENDENT_AMBULATORY_CARE_PROVIDER_SITE_OTHER): Admitting: Podiatry

## 2021-03-23 ENCOUNTER — Encounter: Payer: Self-pay | Admitting: Podiatry

## 2021-03-23 ENCOUNTER — Other Ambulatory Visit: Payer: Self-pay

## 2021-03-23 DIAGNOSIS — I872 Venous insufficiency (chronic) (peripheral): Secondary | ICD-10-CM

## 2021-03-23 DIAGNOSIS — M722 Plantar fascial fibromatosis: Secondary | ICD-10-CM

## 2021-03-23 DIAGNOSIS — M779 Enthesopathy, unspecified: Secondary | ICD-10-CM

## 2021-03-23 DIAGNOSIS — B351 Tinea unguium: Secondary | ICD-10-CM

## 2021-03-23 DIAGNOSIS — M216X9 Other acquired deformities of unspecified foot: Secondary | ICD-10-CM | POA: Diagnosis not present

## 2021-03-27 ENCOUNTER — Other Ambulatory Visit: Payer: Self-pay

## 2021-03-27 ENCOUNTER — Ambulatory Visit (HOSPITAL_COMMUNITY)
Admission: RE | Admit: 2021-03-27 | Discharge: 2021-03-27 | Disposition: A | Discharge status: Home / Self Care | Payer: 59 | Source: Ambulatory Visit | Attending: Vascular Surgery | Admitting: Vascular Surgery

## 2021-03-27 ENCOUNTER — Ambulatory Visit: Payer: 59 | Admitting: Physician Assistant

## 2021-03-27 VITALS — BP 148/78 | HR 67 | Temp 98.7°F | Resp 20 | Ht 66.5 in | Wt 164.4 lb

## 2021-03-27 DIAGNOSIS — I872 Venous insufficiency (chronic) (peripheral): Secondary | ICD-10-CM

## 2021-03-27 DIAGNOSIS — I83893 Varicose veins of bilateral lower extremities with other complications: Secondary | ICD-10-CM | POA: Insufficient documentation

## 2021-03-27 NOTE — Progress Notes (Signed)
Subjective: 75 year old female presents the office today for follow-up evaluation of bilateral foot and ankle pain.  She says about 2 to 3 weeks ago she started feeling and needles sensation on the ball of her foot with a on the left side.  She states that it is worse when she first puts shoes on but after she gets going the symptoms resolved.  She states that she walks at the track at church and the needle feeling goes away.  Also asking for the fifth digit toenails be trimmed as she cannot do them causing discomfort.  Denies any redness or drainage or any swelling.  No other concerns.  Objective: AAO x3, NAD DP/PT pulses palpable bilaterally, CRT less than 3 seconds There is no significant tenderness to palpation bilaterally.  Cavus foot type is present.  There is minimal tenderness palpation on the distal portion of the peroneal tendon on the left side but there is no edema, erythema.  Tendon appears to be intact.  No area of pinpoint tenderness.  Small porokeratotic lesion on the right side plantarly without any ulceration drainage or signs of infection.  No evidence of foreign body.  Hammertoes are present.  Bilateral fifth digit nails are hypertrophic, dystrophic with yellow-brown discoloration.  No Tinel's sign. No pain with calf compression, warmth.  Assessment: Neuritis, tendinitis  Plan: -All treatment options discussed with the patient including all alternatives, risks, complications.  -As a courtesy debrided the nails x2 without complications or bleeding -In regards to that needle feeling I do things can be coming from some inserts.  We discussed about modifications for this.  Symptoms improve with activity.  Will monitor for any worsening or any weakness or radiating pain. -Continue home stretching, rehab. -She is following up with vascular surgery for potential venous reflux left side.  Discussed compression socks.  Vivi Barrack DPM

## 2021-03-27 NOTE — Progress Notes (Signed)
[facility] [place]   Reason for referral: Swollen left leg  History of Present Illness  Rebecca Lewis is a 75 y.o. female who presents with chief complaint: swollen leg.  Patient notes, onset of swelling >12 months ago, associated with prolonged standing.  The patient has had no history of DVT, no history of varicose vein, no history of venous stasis ulcers, no history of  Lymphedema and no history of skin changes in lower legs.  There is no family history of venous disorders.  The patient has not used compression stockings in the past.   She stays activity on a daily basis.  She denise symptoms of claudication, rest pain or non healing wounds.  He swelling is primarily in her ankles.     Past Medical History:  Diagnosis Date  . Abnormal liver function test 08/12/2015  . Arthritis   . GERD (gastroesophageal reflux disease)   . H/O measles   . H/O mumps   . History of chicken pox 05/18/2015  . Hyperglycemia 05/12/2015  . Hyperlipidemia   . Kidney stones   . Neck pain 05/12/2015  . Overweight 08/12/2015  . Pain in joint, shoulder region 05/12/2015   right  . Screen for colon cancer 05/18/2015  . TMJ (temporomandibular joint syndrome) 05/18/2015  . UTI (lower urinary tract infection)     Past Surgical History:  Procedure Laterality Date  . APPENDECTOMY  1966  . arthrotomy and menisectomy Right 05/01/15  . CARPAL TUNNEL RELEASE  09/15/04  . CATARACT EXTRACTION Right 03/03/09  . CATARACT EXTRACTION Left 03/17/09  . ENDOSCOPIC PLANTAR FASCIOTOMY Right 06/21/05  . epicondylitis  08/28/97   Left arm, tennis elbow release  . EXTRACORPOREAL SHOCK WAVE LITHOTRIPSY Right 02/27/09  . hammer toes Right 03/03/09   right foot 2 hammer toes corrected  . LITHOTRIPSY Right 04/09/14  . Neck fusion  6/14.07   C5, C6, C7  . right ulna shortening Right 11/02/07  . SEPTOPLASTY  03/11/10  . SHOULDER SURGERY Right 12/09/10  . TMJ ARTHROPLASTY Left 03/27/13  . ULNAR SHORTENING WITH BONE GRAFT   08/28/02    Social History   Socioeconomic History  . Marital status: Divorced    Spouse name: Not on file  . Number of children: Not on file  . Years of education: Not on file  . Highest education level: Not on file  Occupational History  . Occupation: retired  Tobacco Use  . Smoking status: Former Games developer  . Smokeless tobacco: Never Used  . Tobacco comment: quit in 1977  Substance and Sexual Activity  . Alcohol use: No    Alcohol/week: 0.0 standard drinks  . Drug use: No  . Sexual activity: Yes    Birth control/protection: None    Comment: lives alon, no dietary restrictions  Other Topics Concern  . Not on file  Social History Narrative  . Not on file   Social Determinants of Health   Financial Resource Strain: Not on file  Food Insecurity: Not on file  Transportation Needs: Not on file  Physical Activity: Not on file  Stress: Not on file  Social Connections: Not on file  Intimate Partner Violence: Not on file    Family History  Problem Relation Age of Onset  . Cancer Mother        lymphoma, melanoma  . Heart disease Father 53       MI, arteriothrombosis  . Cancer Sister        lung?  . Heart  disease Brother   . Heart disease Son   . Heart disease Paternal Uncle   . Alcohol abuse Maternal Grandfather   . Heart disease Maternal Grandfather   . Dementia Paternal Grandmother   . Dementia Paternal Grandfather     Current Outpatient Medications on File Prior to Visit  Medication Sig Dispense Refill  . Bromfenac Sodium (PROLENSA) 0.07 % SOLN Apply to eye.    . bupivacaine (MARCAINE) 0.25 % injection by Epidural route.    . calcium elemental as carbonate (BARIATRIC TUMS ULTRA) 400 MG chewable tablet Chew by mouth.    . Cholecalciferol 50 MCG (2000 UT) TBDP Take 1 tablet by mouth daily.    . Chromium Picolinate 500 MCG CAPS Take 1,000 mcg by mouth daily.    . diclofenac sodium (VOLTAREN) 1 % GEL Apply topically.    . diclofenac Sodium (VOLTAREN) 1 % GEL  SMARTSIG:Gram(s) Topical Twice Daily    . diclofenac Sodium (VOLTAREN) 1 % GEL Apply 2 g topically 4 (four) times daily. Rub into affected area of foot 2 to 4 times daily 100 g 2  . dorzolamide (TRUSOPT) 2 % ophthalmic solution Apply to eye.    Marland Kitchen erythromycin ophthalmic ointment Apply to eye.    Marland Kitchen FA-Pyridoxine-Cyancobalamin (FOLTX PO) Take by mouth.    . Ferrous Sulfate (IRON) 325 (65 FE) MG TABS Take 1 tablet by mouth every 3 (three) days.    . fexofenadine (ALLEGRA) 180 MG tablet Take by mouth.    . Flaxseed, Linseed, (FLAXSEED OIL) 1000 MG CAPS Take 1 tablet by mouth daily.    . folic acid-pyridoxine-cyancobalamin (FOLTX) 2.5-25-2 MG TABS tablet Take one tablet by mouth every third day.    . ibuprofen (ADVIL) 800 MG tablet Take 1 tablet (800 mg total) by mouth every 8 (eight) hours as needed. 30 tablet 0  . lidocaine (LIDODERM) 5 %     . Magnesium Citrate 100 MG TABS Take 200 mg by mouth 2 (two) times daily.    . meloxicam (MOBIC) 15 MG tablet Take 15 mg by mouth daily.    . meloxicam (MOBIC) 7.5 MG tablet Take 7.5 mg by mouth daily.    . methylPREDNISolone acetate (DEPO-MEDROL) 40 MG/ML injection     . Omega-3 Fatty Acids (FISH OIL) 1000 MG CAPS Take 950 mg by mouth daily.    . Potassium 99 MG TABS Take 1 tablet by mouth daily.    . Potassium Gluconate 2.5 MEQ TABS Take by mouth.    . prednisoLONE acetate (PRED FORTE) 1 % ophthalmic suspension Apply to eye.    . rosuvastatin (CRESTOR) 10 MG tablet Take by mouth.     . trimethoprim-polymyxin b (POLYTRIM) ophthalmic solution Apply to eye.    . Turmeric 450 MG CAPS Take by mouth.    Marland Kitchen UNABLE TO FIND     . UNABLE TO FIND Take by mouth.    Marland Kitchen UNABLE TO FIND Take by mouth.    . vitamin B-12 (CYANOCOBALAMIN) 1000 MCG tablet Take 1,000 mcg by mouth every 3 (three) days.    Marland Kitchen ZIOPTAN 0.0015 % SOLN      No current facility-administered medications on file prior to visit.    Allergies as of 03/27/2021 - Review Complete 03/23/2021  Allergen  Reaction Noted  . Vicodin [hydrocodone-acetaminophen] Nausea And Vomiting 10/09/2013  . Wasp venom Swelling 11/14/2020  . Aleve [naproxen sodium]  08/12/2015  . Bee venom Other (See Comments) 08/15/2015  . Ibuprofen  08/12/2015  . Poison ivy extract [  poison ivy extract]  10/09/2013  . Pregabalin  05/05/2017  . Tramadol  03/09/2017     ROS:   General:  No weight loss, Fever, chills  HEENT: No recent headaches, no nasal bleeding, no visual changes, no sore throat  Neurologic: No dizziness, blackouts, seizures. No recent symptoms of stroke or mini- stroke. No recent episodes of slurred speech, or temporary blindness.  Cardiac: No recent episodes of chest pain/pressure, no shortness of breath at rest.  No shortness of breath with exertion.  Denies history of atrial fibrillation or irregular heartbeat  Vascular: No history of rest pain in feet.  No history of claudication.  No history of non-healing ulcer, No history of DVT   Pulmonary: No home oxygen, no productive cough, no hemoptysis,  No asthma or wheezing  Musculoskeletal:  [ ]  Arthritis, [ ]  Low back pain,  [x ] Joint pain  Hematologic:No history of hypercoagulable state.  No history of easy bleeding.  No history of anemia  Gastrointestinal: No hematochezia or melena,  No gastroesophageal reflux, no trouble swallowing  Urinary: [ ]  chronic Kidney disease, [ ]  on HD - [ ]  MWF or [ ]  TTHS, [ ]  Burning with urination, [ ]  Frequent urination, [ ]  Difficulty urinating;   Skin: No rashes  Psychological: No history of anxiety,  No history of depression  Physical Examination  Vitals:   03/27/21 1033  BP: (!) 148/78  Pulse: 67  Resp: 20  Temp: 98.7 F (37.1 C)  TempSrc: Temporal  SpO2: 97%  Weight: 164 lb 6.4 oz (74.6 kg)  Height: 5' 6.5" (1.689 m)    Body mass index is 26.14 kg/m.  General:  Alert and oriented, no acute distress HEENT: Normal Neck: No bruit or JVD Pulmonary: Clear to auscultation  bilaterally Cardiac: Regular Rate and Rhythm without murmur Abdomen: Soft, non-tender, non-distended, no mass, no scars Skin: No rash Extremity Pulses:  2+ radial, brachial, femoral, dorsalis pedis, posterior tibial pulses bilaterally Musculoskeletal: No deformity or edema  Neurologic: Upper and lower extremity motor 5/5 and symmetric  DATA:   +--------------+---------+------+-----------+------------+--------+  LEFT     Reflux NoRefluxReflux TimeDiameter cmsComments               Yes                   +--------------+---------+------+-----------+------------+--------+  CFV            yes  >1 second             +--------------+---------+------+-----------+------------+--------+  FV mid    no                         +--------------+---------+------+-----------+------------+--------+  Popliteal   no                         +--------------+---------+------+-----------+------------+--------+  GSV at [facility]  no               0.62        +--------------+---------+------+-----------+------------+--------+  GSV prox thigh                0.32        +--------------+---------+------+-----------+------------+--------+  GSV mid thigh       yes  >500 ms   0.32        +--------------+---------+------+-----------+------------+--------+  GSV dist thigh      yes  >500 ms   0.28        +--------------+---------+------+-----------+------------+--------+  GSV at knee        yes  >500 ms   0.28        +--------------+---------+------+-----------+------------+--------+  GSV prox calf       yes  >500 ms   0.38        +--------------+---------+------+-----------+------------+--------+  SSV Pop Fossa no                0.19        +--------------+---------+------+-----------+------------+--------+          Assessment: Mild venous reflux with mild ankle edema Left:  - No evidence of deep vein thrombosis from the common femoral through the  popliteal veins.  - No evidence of superficial venous thrombosis.  - Common femoral vein reflux observed.  - The great saphenous vein appears incompetent.  - The small saphenous vein appears competent.  She has excellent arterial flow with palpable pedal pulses B.  She is not at risk of limb loss. Her reflux does not include the SFJ and her vein size is < 0.4 cm in diameter.  There is no indication for venous intervention.    Plan: She will continue to stay active.  She was fitted for knee high compression 15-20 mmhg, elevation when at rest.  She will f/u PRN.    Mosetta PigeonEmma Maureen Yamen Castrogiovanni PA-C [facility] [place] Office: [phone]  MD on call

## 2021-04-17 ENCOUNTER — Telehealth: Payer: Self-pay | Admitting: Podiatry

## 2021-04-17 NOTE — Telephone Encounter (Signed)
LVM for patient regarding message, thanks

## 2021-04-17 NOTE — Telephone Encounter (Signed)
Patient called inquiring about orthotics, stated she was told that if anything needed to be adjusted she could come in to see Rebecca Lewis. I did however schedule patient with EJ for the adjustment but patient wanted to know for sure if she should keep appointment based off information that was given by Ardelle Anton, Please Advise

## 2021-04-17 NOTE — Telephone Encounter (Signed)
I can see her about the adjustments and if I cannot do it then I will have her follow up with EJ. Please let her know. Thanks!

## 2021-05-08 ENCOUNTER — Other Ambulatory Visit: Payer: Self-pay

## 2021-05-08 ENCOUNTER — Other Ambulatory Visit: Payer: Medicare Other

## 2021-05-11 ENCOUNTER — Encounter: Payer: Self-pay | Admitting: Podiatry

## 2021-05-11 ENCOUNTER — Other Ambulatory Visit: Payer: Self-pay

## 2021-05-11 ENCOUNTER — Ambulatory Visit (INDEPENDENT_AMBULATORY_CARE_PROVIDER_SITE_OTHER): Admitting: Podiatry

## 2021-05-11 DIAGNOSIS — M7741 Metatarsalgia, right foot: Secondary | ICD-10-CM

## 2021-05-11 DIAGNOSIS — M7742 Metatarsalgia, left foot: Secondary | ICD-10-CM

## 2021-05-11 DIAGNOSIS — M722 Plantar fascial fibromatosis: Secondary | ICD-10-CM

## 2021-05-11 DIAGNOSIS — M216X9 Other acquired deformities of unspecified foot: Secondary | ICD-10-CM

## 2021-05-13 ENCOUNTER — Telehealth: Payer: Self-pay | Admitting: Podiatry

## 2021-05-13 NOTE — Progress Notes (Signed)
Subjective: 75 year old female presents the office today for follow evaluation of foot pain.  She states that she had her inserts adjusted on Friday but the inserts that she is wearing currently the heel is too high on the left foot which is causing her gait to be off and causing other issues.  She denies any recent injury or falls.  Denies any systemic complaints such as fevers, chills, nausea, vomiting. No acute changes since last appointment, and no other complaints at this time.   Objective: AAO x3, NAD DP/PT pulses palpable bilaterally, CRT less than 3 seconds There is discomfort along submetatarsal area most on the left foot.  There is no area pinpoint tenderness.  Cavus foot type is present.  Flexor, extensor tendons appear to be intact.  No significant edema, erythema bilaterally. No pain with calf compression, swelling, warmth, erythema  Assessment: Chronic foot pain  Plan: -All treatment options discussed with the patient including all alternatives, risks, complications.  -The left heel is much higher on the orthotic compared to the right side.  I think wearing this down to make it more even. When she gets the new inserts back we can send the other set of inserts back to the lab if needed for modifications. -Patient encouraged to call the office with any questions, concerns, change in symptoms.   Vivi Barrack DPM

## 2021-05-13 NOTE — Telephone Encounter (Signed)
Called pt per Dr Ardelle Anton pt was told by EJ that it would only take a day to get the orthotic back.. I left her a message that it takes at least a week if not a little longer because we send it back to the manufacturer and then they have to correct it and send it back to [place]. I apologized but left message that I would call when it comes in.

## 2021-05-20 ENCOUNTER — Telehealth: Payer: Self-pay | Admitting: Podiatry

## 2021-05-20 NOTE — Telephone Encounter (Signed)
Left adjusted orthotic in..lvm for pt ok to pick up... 

## 2021-05-25 ENCOUNTER — Ambulatory Visit: Payer: Medicare Other | Admitting: Podiatry

## 2021-05-26 ENCOUNTER — Telehealth: Payer: Self-pay | Admitting: Podiatry

## 2021-05-26 NOTE — Telephone Encounter (Signed)
Patient called and stated that her left orthotic is still not correct. She is a walker and wanted to get in asap to see Dr. Ardelle Anton or even EJ. I did let her know that we can keep a lookout for a cancellation for Dr. Ardelle Anton or EJ on thursday.

## 2021-06-16 ENCOUNTER — Other Ambulatory Visit: Payer: Self-pay

## 2021-06-16 ENCOUNTER — Ambulatory Visit (INDEPENDENT_AMBULATORY_CARE_PROVIDER_SITE_OTHER): Admitting: Podiatry

## 2021-06-16 DIAGNOSIS — M7741 Metatarsalgia, right foot: Secondary | ICD-10-CM

## 2021-06-16 DIAGNOSIS — M779 Enthesopathy, unspecified: Secondary | ICD-10-CM

## 2021-06-16 DIAGNOSIS — M216X9 Other acquired deformities of unspecified foot: Secondary | ICD-10-CM

## 2021-06-16 DIAGNOSIS — M7742 Metatarsalgia, left foot: Secondary | ICD-10-CM

## 2021-06-16 NOTE — Patient Instructions (Signed)

## 2021-06-18 ENCOUNTER — Ambulatory Visit: Payer: Medicare Other | Admitting: Podiatry

## 2021-06-19 NOTE — Progress Notes (Signed)
Subjective: 75 year old female presents the office today for follow evaluation of foot pain/orthotics.  She states that she has seen EJ, our pedorthotist, and she is not happy with her orthotics.  On the left side is still too high and it causes her to fall.  Also she has been getting some generalized ankle and foot discomfort and it seems to correspond when she started wearing the orthotics.   Objective: AAO x3, NAD DP/PT pulses palpable bilaterally, CRT less than 3 seconds There is no area of pinpoint tenderness.  She does get discomfort of the ankles bilaterally but there is no edema.  Flexor, extensor tendons appear to be intact.  Particularly palpation of the peroneal tendon with slight discomfort but there is no ecchymosis.  There is no tenderness intact overall.  Cavus foot type is evident.  MMT 5/5. No pain with calf compression, swelling, warmth, erythema  Assessment: Chronic foot pain; capsulitis/tendinitis  Plan: -All treatment options discussed with the patient including all alternatives, risks, complications.  -I further evaluated the inserts that she has been wearing and again with the one insert is too high, which makes her uneven when she walks.  I did not write this down for her today.  We will see how this does when she continues to have symptoms we will need to remake the orthotic.  Discussed general rehab, stretching on a regular basis as well.  We discussed steroid injection today but she is also follow-up on this. -Patient encouraged to call the office with any questions, concerns, change in symptoms.   Vivi Barrack DPM

## 2021-07-02 ENCOUNTER — Telehealth: Payer: Self-pay | Admitting: Podiatry

## 2021-07-02 NOTE — Telephone Encounter (Signed)
Pt called and is scheduled to see EJ on 9.2 (his next available) and having a hard time walking and leg issues and wanted to make sure there was nothing sooner. I apologized that was next available but I did put her on the waitlist if I get any cancellations I would call to see if it would work for her. She said thank you for doing that.

## 2021-07-03 ENCOUNTER — Other Ambulatory Visit: Payer: Self-pay | Admitting: Podiatry

## 2021-07-03 ENCOUNTER — Telehealth: Payer: Self-pay | Admitting: *Deleted

## 2021-07-03 MED ORDER — IBUPROFEN 800 MG PO TABS: 800.0000 mg | ORAL_TABLET | Freq: Three times a day (TID) | ORAL | 0 refills | Status: AC | PRN

## 2021-07-03 NOTE — Telephone Encounter (Signed)
Patient is calling for a refill of  Ibuprofren- 800 mg,sent to pharmacy on file. She is still having lower back pain because of orthotic problems. Please advise.

## 2021-07-03 NOTE — Telephone Encounter (Signed)
Tried to call the patient and there was no answer and I was calling to tell patient that the RX was sent to pharmacy per Dr Ardelle Anton. [name]

## 2021-07-13 ENCOUNTER — Encounter: Payer: Self-pay | Admitting: Physician Assistant

## 2021-07-13 ENCOUNTER — Other Ambulatory Visit: Payer: Self-pay

## 2021-07-13 ENCOUNTER — Ambulatory Visit (INDEPENDENT_AMBULATORY_CARE_PROVIDER_SITE_OTHER): Payer: POS | Admitting: Physician Assistant

## 2021-07-13 VITALS — BP 156/87 | HR 82 | Temp 98.2°F | Ht 66.5 in | Wt 159.8 lb

## 2021-07-13 DIAGNOSIS — I872 Venous insufficiency (chronic) (peripheral): Secondary | ICD-10-CM | POA: Diagnosis not present

## 2021-07-13 NOTE — Progress Notes (Signed)
[facility] [place]     History of Present Illness  Rebecca DecampLinda Lewis is a 75 y.o. female who presents with chief complaint: swollen leg.  She was seen by me on 03/27/21 with left LE edema.  She denise skin changes, open wounds and no weeping. He venous reflux is from mid thigh GSV to  the calf  without vein size increases and no SFJ reflux.  She was given knee high compression 15-20 mm hg to try.  She states she still gets edema.  She is here today to review her study and ask more questions about preventing increased edema.    She does have a history of spinal stenosis and wears orthotics in both shoes for "high arch support."  She is very active and walks 2-3 miles 5 times a week.  She has been elevating her legs when she is at rest.    Hyperlipidemia managed with Statin     Past Medical History:  Diagnosis Date   Abnormal liver function test 08/12/2015   Arthritis    GERD (gastroesophageal reflux disease)    H/O measles    H/O mumps    History of chicken pox 05/18/2015   Hyperglycemia 05/12/2015   Hyperlipidemia    Kidney stones    Neck pain 05/12/2015   Overweight 08/12/2015   Pain in joint, shoulder region 05/12/2015   right   Screen for colon cancer 05/18/2015   TMJ (temporomandibular joint syndrome) 05/18/2015   UTI (lower urinary tract infection)     Past Surgical History:  Procedure Laterality Date   APPENDECTOMY  1966   arthrotomy and menisectomy Right 05/01/15   CARPAL TUNNEL RELEASE  09/15/04   CATARACT EXTRACTION Right 03/03/09   CATARACT EXTRACTION Left 03/17/09   ENDOSCOPIC PLANTAR FASCIOTOMY Right 06/21/05   epicondylitis  08/28/97   Left arm, tennis elbow release   EXTRACORPOREAL SHOCK WAVE LITHOTRIPSY Right 02/27/09   hammer toes Right 03/03/09   right foot 2 hammer toes corrected   LITHOTRIPSY Right 04/09/14   Neck fusion  6/14.07   C5, C6, C7   right ulna shortening Right 11/02/07   SEPTOPLASTY  03/11/10   SHOULDER SURGERY Right 12/09/10   TMJ  ARTHROPLASTY Left 03/27/13   ULNAR SHORTENING WITH BONE GRAFT  08/28/02    Social History   Socioeconomic History   Marital status: Divorced    Spouse name: Not on file   Number of children: Not on file   Years of education: Not on file   Highest education level: Not on file  Occupational History   Occupation: retired  Tobacco Use   Smoking status: Former   Smokeless tobacco: Never   Tobacco comments:    quit in 1977  Substance and Sexual Activity   Alcohol use: No    Alcohol/week: 0.0 standard drinks   Drug use: No   Sexual activity: Yes    Birth control/protection: None    Comment: lives alon, no dietary restrictions  Other Topics Concern   Not on file  Social History Narrative   Not on file   Social Determinants of Health   Financial Resource Strain: Not on file  Food Insecurity: Not on file  Transportation Needs: Not on file  Physical Activity: Not on file  Stress: Not on file  Social Connections: Not on file  Intimate Partner Violence: Not on file    Family History  Problem Relation Age of Onset   Cancer Mother  lymphoma, melanoma   Heart disease Father 44       MI, arteriothrombosis   Cancer Sister        lung?   Heart disease Brother    Heart disease Son    Heart disease Paternal Uncle    Alcohol abuse Maternal Grandfather    Heart disease Maternal Grandfather    Dementia Paternal Grandmother    Dementia Paternal Grandfather     Current Outpatient Medications on File Prior to Visit  Medication Sig Dispense Refill   Bromfenac Sodium (PROLENSA) 0.07 % SOLN Apply to eye.     bupivacaine (MARCAINE) 0.25 % injection by Epidural route.     calcium elemental as carbonate (BARIATRIC TUMS ULTRA) 400 MG chewable tablet Chew by mouth.     Cholecalciferol 50 MCG (2000 UT) TBDP Take 1 tablet by mouth daily.     Chromium Picolinate 500 MCG CAPS Take 1,000 mcg by mouth daily.     diclofenac Sodium (VOLTAREN) 1 % GEL Apply 2 g topically 4 (four) times  daily. Rub into affected area of foot 2 to 4 times daily 100 g 2   dorzolamide (TRUSOPT) 2 % ophthalmic solution Apply to eye.     Ferrous Sulfate (IRON) 325 (65 FE) MG TABS Take 1 tablet by mouth every 3 (three) days.     Flaxseed, Linseed, (FLAXSEED OIL) 1000 MG CAPS Take 1 tablet by mouth daily.     folic acid-pyridoxine-cyancobalamin (FOLTX) 2.5-25-2 MG TABS tablet Take one tablet by mouth every third day.     ibuprofen (ADVIL) 800 MG tablet Take 1 tablet (800 mg total) by mouth every 8 (eight) hours as needed. 30 tablet 0   lidocaine (LIDODERM) 5 %      Magnesium Citrate 100 MG TABS Take 200 mg by mouth 2 (two) times daily.     Omega-3 Fatty Acids (FISH OIL) 1000 MG CAPS Take 950 mg by mouth daily.     Potassium 99 MG TABS Take 1 tablet by mouth daily.     rosuvastatin (CRESTOR) 10 MG tablet Take by mouth.      Turmeric 450 MG CAPS Take by mouth.     UNABLE TO FIND      UNABLE TO FIND Take by mouth.     UNABLE TO FIND Take by mouth.     vitamin B-12 (CYANOCOBALAMIN) 1000 MCG tablet Take 1,000 mcg by mouth every 3 (three) days.     ZIOPTAN 0.0015 % SOLN      meloxicam (MOBIC) 15 MG tablet Take 15 mg by mouth daily. (Patient not taking: Reported on 07/13/2021)     meloxicam (MOBIC) 7.5 MG tablet Take 7.5 mg by mouth daily. (Patient not taking: Reported on 07/13/2021)     No current facility-administered medications on file prior to visit.    Allergies as of 07/13/2021 - Review Complete 07/13/2021  Allergen Reaction Noted   Vicodin [hydrocodone-acetaminophen] Nausea And Vomiting 10/09/2013   Wasp venom Swelling 11/14/2020   Aleve [naproxen sodium]  08/12/2015   Bee venom Other (See Comments) 08/15/2015   Ibuprofen  08/12/2015   Poison ivy extract [poison ivy extract]  10/09/2013   Pregabalin  05/05/2017   Tramadol  03/09/2017     ROS:   General:  No weight loss, Fever, chills  HEENT: No recent headaches, no nasal bleeding, no visual changes, no sore throat  Neurologic: No  dizziness, blackouts, seizures. No recent symptoms of stroke or mini- stroke. No recent episodes of slurred speech, or temporary  blindness.  Cardiac: No recent episodes of chest pain/pressure, no shortness of breath at rest.  No shortness of breath with exertion.  Denies history of atrial fibrillation or irregular heartbeat  Vascular: No history of rest pain in feet.  No history of claudication.  No history of non-healing ulcer, No history of DVT   Pulmonary: No home oxygen, no productive cough, no hemoptysis,  No asthma or wheezing  Musculoskeletal:  [ ]  Arthritis, [ x] Low back pain,  [x ] Joint pain  Hematologic:No history of hypercoagulable state.  No history of easy bleeding.  No history of anemia  Gastrointestinal: No hematochezia or melena,  No gastroesophageal reflux, no trouble swallowing  Urinary: [ ]  chronic Kidney disease, [ ]  on HD - [ ]  MWF or [ ]  TTHS, [ ]  Burning with urination, [ ]  Frequent urination, [ ]  Difficulty urinating;   Skin: No rashes  Psychological: No history of anxiety,  No history of depression  Physical Examination  Vitals:   07/13/21 1106  BP: (!) 156/87  Pulse: 82  Temp: 98.2 F (36.8 C)  TempSrc: Temporal  SpO2: 97%  Weight: 159 lb 12.8 oz (72.5 kg)  Height: 5' 6.5" (1.689 m)    Body mass index is 25.41 kg/m.  General:  Alert and oriented, no acute distress HEENT: Normal Neck: No bruit or JVD Pulmonary: Clear to auscultation bilaterally Cardiac: Regular Rate and Rhythm without murmur Abdomen: Soft, non-tender, non-distended, no mass, no scars Skin: No rash Extremity Pulses:  2+ radial, brachial, femoral, dorsalis pedis, posterior tibial pulses bilaterally Musculoskeletal: Mild edema in the left LE main calf and ankle edema  Neurologic: Upper and lower extremity motor 5/5 and symmetric  DATA: +--------------+---------+------+-----------+------------+--------+  LEFT          Reflux NoRefluxReflux TimeDiameter cmsComments                           Yes                                   +--------------+---------+------+-----------+------------+--------+  CFV                     yes   >1 second                       +--------------+---------+------+-----------+------------+--------+  FV mid        no                                              +--------------+---------+------+-----------+------------+--------+  Popliteal     no                                              +--------------+---------+------+-----------+------------+--------+  GSV at [facility]    no                            0.62              +--------------+---------+------+-----------+------------+--------+  GSV prox thigh  0.32              +--------------+---------+------+-----------+------------+--------+  GSV mid thigh           yes    >500 ms      0.32              +--------------+---------+------+-----------+------------+--------+  GSV dist thigh          yes    >500 ms      0.28              +--------------+---------+------+-----------+------------+--------+  GSV at knee             yes    >500 ms      0.28              +--------------+---------+------+-----------+------------+--------+  GSV prox calf           yes    >500 ms      0.38              +--------------+---------+------+-----------+------------+--------+  SSV Pop Fossa no                            0.19              +--------------+---------+------+-----------+------------+--------+      Technically difficult exam.      Summary:  Left:  - No evidence of deep vein thrombosis from the common femoral through the  popliteal veins.  - No evidence of superficial venous thrombosis.  - Common femoral vein reflux observed.  - The great saphenous vein appears incompetent.  - The small saphenous vein appears competent.     Assessment/Plan: Venous reflux with mild edema left LE There is  no evidence of skin changes She is very active and uses elevation when at rest. After reviewing the study again with her and her stating that the edema is now at the top of the compression knee high, we have decided to try thigh high 15-20 mm hg compression since her reflux in mid thigh to calf.    If she develops more symptoms we can repeat the reflux study.  She will f/u PRN.  She does have plans to f/u with her physician in regards to her spinal stenosis.  She also mentioned that her left leg feels weak and has decreased flexion.  I don't think these symptoms relate to her venous reflux.      Mosetta Pigeon PA-C [facility] [place] Office: [phone]  MD in [place]

## 2021-07-31 ENCOUNTER — Other Ambulatory Visit: Payer: Self-pay

## 2021-07-31 ENCOUNTER — Other Ambulatory Visit: Payer: Medicare Other

## 2021-08-04 ENCOUNTER — Ambulatory Visit (INDEPENDENT_AMBULATORY_CARE_PROVIDER_SITE_OTHER)

## 2021-08-04 ENCOUNTER — Ambulatory Visit (INDEPENDENT_AMBULATORY_CARE_PROVIDER_SITE_OTHER): Admitting: Podiatry

## 2021-08-04 ENCOUNTER — Other Ambulatory Visit: Payer: Self-pay

## 2021-08-04 DIAGNOSIS — M722 Plantar fascial fibromatosis: Secondary | ICD-10-CM

## 2021-08-04 DIAGNOSIS — M216X9 Other acquired deformities of unspecified foot: Secondary | ICD-10-CM | POA: Diagnosis not present

## 2021-08-04 DIAGNOSIS — M7752 Other enthesopathy of left foot: Secondary | ICD-10-CM

## 2021-08-04 MED ORDER — LIDOCAINE 5 % EX PTCH: 1.0000 | MEDICATED_PATCH | CUTANEOUS | 1 refills | Status: AC

## 2021-08-04 NOTE — Progress Notes (Signed)
Subjective: 75 year old female presents the office today for follow evaluation of foot pain/orthotics.  She states that she is still having some discomfort in the bottom of the right heel.  She is not ready for steroid injection yet.  She also gets tightness along the peroneal tendon on the left side.  She states that while at church she was doing calf raises and this helped limit her pain on that side.  She was measured for new orthotics on Friday with a EJ.  Objective: AAO x3, NAD DP/PT pulses palpable bilaterally, CRT less than 3 seconds There is mild tenderness palpation on plantar medial tubercle of the calcaneus insertion of plantar fascia on the right side.  There is no pain with all compression of calcaneus.  There is no discomfort of the arch of the foot.  There is no other areas of discomfort on the right side.  On the left side there is no significant discomfort of the peroneal tendons or along the plantar fascia.  There is no edema, erythema bilaterally.  MMT 5/5.  Cavus foot type noted.  Hammertoes present. No pain with calf compression, swelling, warmth, erythema  Assessment: Chronic foot pain; capsulitis/tendinitis  Plan: -All treatment options discussed with the patient including all alternatives, risks, complications.  -X-rays were obtained and reviewed with the patient. No evidence of acute fracture/stress fracture.  -We discussed various treatment for plantar fasciitis.  She is not ready for steroid injection yet so we held off on this.  Prescribed lidocaine patches for both issues as this was helpful for other issues and she is asking for this medication.  Discussed different stretching, icing daily.  Awaiting new orthotics. -Patient encouraged to call the office with any questions, concerns, change in symptoms.   Vivi Barrack DPM

## 2021-08-10 ENCOUNTER — Telehealth: Payer: Self-pay | Admitting: *Deleted

## 2021-08-10 NOTE — Telephone Encounter (Signed)
Patient is calling for the status of a medication that was supposed to be sent to pharmacy,not there. Called the pharmacy said that medication was ready for pick up,returned call and informed the patient, verbalized understanding.

## 2021-08-11 ENCOUNTER — Telehealth: Payer: Self-pay | Admitting: *Deleted

## 2021-08-11 NOTE — Telephone Encounter (Signed)
Patient is calling in regards to toe separators,unable to find, is there a particular name?She does not shop on [address].   She is also wanting to let the doctor  know that department of labor will only allow a 30 day supply of prescription(lidocaine). Please advise.

## 2021-08-13 ENCOUNTER — Telehealth: Payer: Self-pay | Admitting: *Deleted

## 2021-08-13 NOTE — Telephone Encounter (Signed)
I called and left a message for the patient stating that the toe sparators can be ordered thru Dr Edwena Felty and that Dr Ardelle Anton wanted to know does she need more patches and I stated too that I was in another location today and that she could call [facility] about letting Dr Ardelle Anton know about the patches. Rebecca Lewis

## 2021-08-13 NOTE — Telephone Encounter (Signed)
Patient called stating she had talked to [place]. [name] asked her to call to let you know that she did NOT need another 30 day supply of lidocaine patches.

## 2021-08-17 NOTE — Telephone Encounter (Addendum)
Called, no answer, left vmessage to call back if she is needing an updated prescription for more patches.

## 2021-08-27 NOTE — Telephone Encounter (Signed)
Error

## 2021-08-31 ENCOUNTER — Telehealth: Payer: Self-pay | Admitting: Podiatry

## 2021-08-31 NOTE — Telephone Encounter (Signed)
Left message for pt to call to change appt as EJ is going to be out of the office on 10.28.. have morning appt on 10.14 available .

## 2021-09-03 ENCOUNTER — Other Ambulatory Visit: Payer: Self-pay

## 2021-09-03 ENCOUNTER — Ambulatory Visit (INDEPENDENT_AMBULATORY_CARE_PROVIDER_SITE_OTHER): Admitting: Podiatry

## 2021-09-03 DIAGNOSIS — M722 Plantar fascial fibromatosis: Secondary | ICD-10-CM | POA: Diagnosis not present

## 2021-09-03 DIAGNOSIS — L989 Disorder of the skin and subcutaneous tissue, unspecified: Secondary | ICD-10-CM

## 2021-09-09 NOTE — Progress Notes (Signed)
Subjective: 75 year old female presents the office today for continued discomfort of the bottom of her right heel, Planter fasciitis.  She does use lidocaine patches as needed.  She has noticed a small callused area that is been present previously and she not sure if this is was causing her heel pain.  The pain is worse certain parts of the day.  She recently got new inserts and still concerned about the height on them.  Objective: AAO x3, NAD DP/PT pulses palpable bilaterally, CRT less than 3 seconds Small punctate annular hyperkeratotic lesion plantar aspect the right heel.  Upon debridement there is no underlying ulceration drainage or any signs of infection.  There is tenderness palpation along the plantar medial tubercle of the calcaneus at the insertion of plantar fascia.  The plantar fascia appears to be intact.  No pain with lateral compression of calcaneus.  No other areas of discomfort.  No edema, erythema bilaterally.  Cavus foot type is present.  No pain with calf compression, swelling, warmth, erythema  Assessment: Plantar fasciitis, skin lesion right side  Plan: -All treatment options discussed with the patient including all alternatives, risks, complications.  -Sharply debrided the small hyperkeratotic lesion without any complications or bleeding.  Recommend moisturizer. -We discussed steroid injection but ultimately held off on this today.  Continue stretching, icing on a regular basis.  Also she is concerned about the orthotics.  But have her come back when EJ is here as well with myself and we can discuss them. -Patient encouraged to call the office with any questions, concerns, change in symptoms.   Vivi Barrack DPM

## 2021-09-11 ENCOUNTER — Other Ambulatory Visit

## 2021-09-25 ENCOUNTER — Other Ambulatory Visit: Payer: Medicare Other

## 2021-10-12 ENCOUNTER — Telehealth: Payer: Self-pay | Admitting: Podiatry

## 2021-10-12 NOTE — Telephone Encounter (Signed)
Pt left message stating she was checking on the new guy starting she would like an appt with him.   I returned call and left message that he is to be starting today but has to go thru [employer] so I do not have a schedule for him yet. I asked pt to give me a call back in a week or two and we should have an update.

## 2021-10-13 ENCOUNTER — Telehealth: Payer: Self-pay | Admitting: Podiatry

## 2021-10-13 ENCOUNTER — Other Ambulatory Visit: Payer: Self-pay

## 2021-10-13 ENCOUNTER — Ambulatory Visit (INDEPENDENT_AMBULATORY_CARE_PROVIDER_SITE_OTHER): Admitting: Podiatry

## 2021-10-13 DIAGNOSIS — M216X9 Other acquired deformities of unspecified foot: Secondary | ICD-10-CM

## 2021-10-13 DIAGNOSIS — M722 Plantar fascial fibromatosis: Secondary | ICD-10-CM

## 2021-10-13 DIAGNOSIS — M7741 Metatarsalgia, right foot: Secondary | ICD-10-CM

## 2021-10-13 DIAGNOSIS — M7742 Metatarsalgia, left foot: Secondary | ICD-10-CM

## 2021-10-13 NOTE — Telephone Encounter (Signed)
Pt left message returning my call from yesterday stating she accidentally deleted the message I left before she could listen to it..  I returned call and left message that we do not have a schedule as of yet for the new pedorthist and to call the beginning of next week if she does not hear from me before that.

## 2021-10-13 NOTE — Telephone Encounter (Signed)
Left message for pt that I have scheduled her to see the new pedorthist next Tuesday @ 115 and to call to confirm.Marland Kitchen

## 2021-10-19 NOTE — Progress Notes (Signed)
Subjective: 75 year old female presents the office today for continued discomfort of the bottom of her right heel, Plantar fasciitis.  She feels an orthotic is pushing up too much causing her foot over the top of her foot.  She is getting up quite a bit of issues with her orthotics. Denies any recent injury or changes otherwise since I last saw her and she has no new concerns.  Objective: AAO x3, NAD DP/PT pulses palpable bilaterally, CRT less than 3 seconds Majority tenderness is still on the right heel and the plantar aspect calcaneus and there is mild atrophy of the fat pad noted.  There is no area pinpoint tenderness identified bilaterally.  The scars from prior surgeries are well-healed.  She has cavus foot type with prominent metatarsal heads plantarly. No pain with calf compression, swelling, warmth, erythema  Assessment: Plantar fasciitis, metatarsalgia  Plan: -All treatment options discussed with the patient including all alternatives, risks, complications.  -Majority of her issues seem to coming from her orthotics. I am going to have her follow-up with our orthotist, Arlys John Little for this next week.  She has had multiple orthotics and some been helpful but she is been having some issues recently try getting comfortable. -Continue with home stretching, rehab exercises as well.  I spent 25 minutes with the patient.  Greater than 50% this time was in face-to-face contact.  Vivi Barrack DPM

## 2021-10-20 ENCOUNTER — Ambulatory Visit

## 2021-10-20 ENCOUNTER — Other Ambulatory Visit: Payer: Self-pay

## 2021-10-20 DIAGNOSIS — M722 Plantar fascial fibromatosis: Secondary | ICD-10-CM

## 2021-10-20 DIAGNOSIS — M216X9 Other acquired deformities of unspecified foot: Secondary | ICD-10-CM

## 2021-10-20 NOTE — Progress Notes (Signed)
SITUATION Reason for Consult: Follow-up with bilateral foot orthotics Patient / Caregiver Report: Patient reports multiple issues including on the left foot pain across the dorsum as well as soreness below the   OBJECTIVE DATA History / Diagnosis: Pes cavus, plantar fasciitis Change in Pathology: None  ACTIONS PERFORMED Patient's equipment was checked for structural stability and fit. Added metpad on the right foot to solve 1st and 2nd met pain. Changed lacing pattern and reduced heel height on left. Device(s) intact and fit is excellent. All questions answered and concerns addressed.  PLAN Follow-up as needed (PRN). Plan of care discussed with and agreed upon by patient / caregiver.

## 2021-12-02 ENCOUNTER — Other Ambulatory Visit

## 2021-12-09 ENCOUNTER — Ambulatory Visit

## 2021-12-09 ENCOUNTER — Other Ambulatory Visit: Payer: Self-pay

## 2021-12-09 DIAGNOSIS — M722 Plantar fascial fibromatosis: Secondary | ICD-10-CM

## 2021-12-09 NOTE — Progress Notes (Signed)
SITUATION Reason for Consult: Follow-up with functional foot orthotics Patient / Caregiver Report: Patient reports pain in heels.  OBJECTIVE DATA History / Diagnosis:    ICD-10-CM   1. Plantar fasciitis  M72.2       Change in Pathology: None  ACTIONS PERFORMED Patient's equipment was checked for structural stability and fit. Will have inserts redone to match previous model [id number] Device(s) intact and fit is excellent. All questions answered and concerns addressed.  PLAN Return for fitting in four weeks. Plan of care discussed with and agreed upon by patient / caregiver.

## 2022-02-02 ENCOUNTER — Other Ambulatory Visit: Payer: Self-pay

## 2022-02-02 ENCOUNTER — Ambulatory Visit: Payer: Medicare Other

## 2022-02-02 DIAGNOSIS — M7741 Metatarsalgia, right foot: Secondary | ICD-10-CM

## 2022-02-02 DIAGNOSIS — M7742 Metatarsalgia, left foot: Secondary | ICD-10-CM

## 2022-02-02 DIAGNOSIS — M722 Plantar fascial fibromatosis: Secondary | ICD-10-CM

## 2022-02-02 DIAGNOSIS — M216X9 Other acquired deformities of unspecified foot: Secondary | ICD-10-CM

## 2022-02-02 NOTE — Progress Notes (Signed)
SITUATION ?Reason for Consult: Follow-up with foot orthotic re-dos ?Patient / Caregiver Report: Patient reports satisfaction and accepts orthotics as they are ? ?OBJECTIVE DATA ?History / Diagnosis:  ?  ICD-10-CM   ?1. Plantar fasciitis  M72.2   ?  ?2. Cavus deformity of foot, acquired  M21.6X9   ?  ?3. Metatarsalgia of both feet  M77.41   ? M77.42   ?  ? ? ?Change in Pathology: None ? ?ACTIONS PERFORMED ?Patient's equipment was checked for structural stability and fit. Fit is excellent. Re-makes are accepted. Device(s) intact and fit is excellent. All questions answered and concerns addressed. ? ?PLAN ?Follow-up as needed (PRN). Plan of care discussed with and agreed upon by patient / caregiver. ? ?

## 2022-03-11 ENCOUNTER — Ambulatory Visit (INDEPENDENT_AMBULATORY_CARE_PROVIDER_SITE_OTHER)

## 2022-03-11 ENCOUNTER — Ambulatory Visit (INDEPENDENT_AMBULATORY_CARE_PROVIDER_SITE_OTHER): Admitting: Podiatry

## 2022-03-11 DIAGNOSIS — M216X9 Other acquired deformities of unspecified foot: Secondary | ICD-10-CM | POA: Diagnosis not present

## 2022-03-11 DIAGNOSIS — M779 Enthesopathy, unspecified: Secondary | ICD-10-CM | POA: Diagnosis not present

## 2022-03-11 DIAGNOSIS — M778 Other enthesopathies, not elsewhere classified: Secondary | ICD-10-CM

## 2022-03-11 DIAGNOSIS — M722 Plantar fascial fibromatosis: Secondary | ICD-10-CM

## 2022-03-11 NOTE — Progress Notes (Signed)
Subjective: ?76 year old female presents the office today for concerns of pain to the top of her left foot mostly at nighttime in the morning.  She states that when she keeps her foot in her relaxed position and falls forward she gets discomfort in the top of her foot.  She was to get a new recliner to try to help with this as well.  She took some meloxicam which was also beneficial somewhat.  No injuries that she reports.  She states the new orthotics are helpful but the inserts that she wears in the house she still tends to roll her ankle.  No other concerns. ? ?Objective: ?AAO x3, NAD ?DP/PT pulses palpable bilaterally, CRT less than 3 seconds ?Unable to express any area of pinpoint tenderness today.  No edema, erythema.  Flexor, extensor tendons appear to be intact.  MMT 5/5.  Cavus foot type is present. ?No pain with calf compression, swelling, warmth, erythema ? ?Assessment: ?Capsulitis midfoot ? ?Plan: ?-All treatment options discussed with the patient including all alternatives, risks, complications.  ?-X-rays obtained reviewed.  3 views of the left foot were obtained.  No evidence of acute fracture. ?-We discussed trying to wear night splint to see if this will be beneficial at night.  She is going to continue with icing and we discussed topical medications versus oral anti-inflammatories. ?-She was seen by Aaron Edelman and she wants a new pair of orthotics.  These will be ordered for her. ?-Patient encouraged to call the office with any questions, concerns, change in symptoms.  ? ?Trula Amon DPM ? ?

## 2022-03-29 ENCOUNTER — Telehealth: Payer: Self-pay

## 2022-03-29 NOTE — Telephone Encounter (Signed)
Foot orthotics ready - patient requested to come and pick them up without an appointment as they are a 2nd pair/duplicate. ?

## 2022-04-19 ENCOUNTER — Ambulatory Visit

## 2022-04-19 DIAGNOSIS — M722 Plantar fascial fibromatosis: Secondary | ICD-10-CM

## 2022-04-19 DIAGNOSIS — M216X9 Other acquired deformities of unspecified foot: Secondary | ICD-10-CM

## 2022-04-19 DIAGNOSIS — M7741 Metatarsalgia, right foot: Secondary | ICD-10-CM

## 2022-04-19 DIAGNOSIS — M778 Other enthesopathies, not elsewhere classified: Secondary | ICD-10-CM

## 2022-04-19 DIAGNOSIS — M779 Enthesopathy, unspecified: Secondary | ICD-10-CM

## 2022-04-19 NOTE — Progress Notes (Signed)
SITUATION: Reason for Visit: Fitting and Delivery of Custom Fabricated Foot Orthoses Patient Report: Patient reports comfort and is satisfied with device.  OBJECTIVE DATA: Patient History / Diagnosis:     ICD-10-CM   1. Capsulitis of foot, left  M77.8     2. Cavus deformity of foot, acquired  M21.6X9     3. Tendonitis  M77.9     4. Plantar fasciitis  M72.2     5. Metatarsalgia of both feet  M77.41    M77.42       Provided Device:  Custom Functional Foot Orthotics     [facility]: [id number]  GOAL OF ORTHOSIS - Improve gait - Decrease energy expenditure - Improve Balance - Provide Triplanar stability of foot complex - Facilitate motion  ACTIONS PERFORMED Patient was fit with foot orthotics trimmed to shoe last. Patient tolerated fittign procedure.   Patient was provided with verbal and written instruction and demonstration regarding donning, doffing, wear, care, proper fit, function, purpose, cleaning, and use of the orthosis and in all related precautions and risks and benefits regarding the orthosis.  Patient was also provided with verbal instruction regarding how to report any failures or malfunctions of the orthosis and necessary follow up care. Patient was also instructed to contact our office regarding any change in status that may affect the function of the orthosis.  Patient demonstrated independence with proper donning, doffing, and fit and verbalized understanding of all instructions.  PLAN: Patient is to follow up in one week or as necessary (PRN). All questions were answered and concerns addressed. Plan of care was discussed with and agreed upon by the patient.

## 2022-05-13 ENCOUNTER — Ambulatory Visit

## 2022-05-13 DIAGNOSIS — M7741 Metatarsalgia, right foot: Secondary | ICD-10-CM

## 2022-05-13 DIAGNOSIS — M722 Plantar fascial fibromatosis: Secondary | ICD-10-CM

## 2022-05-13 DIAGNOSIS — M779 Enthesopathy, unspecified: Secondary | ICD-10-CM

## 2022-05-13 DIAGNOSIS — M778 Other enthesopathies, not elsewhere classified: Secondary | ICD-10-CM

## 2022-05-13 DIAGNOSIS — M216X9 Other acquired deformities of unspecified foot: Secondary | ICD-10-CM

## 2022-05-13 NOTE — Progress Notes (Signed)
SITUATION Reason for Consult: Follow-up with right forthosis Patient / Caregiver Report: patient reports metpad feels too tall  OBJECTIVE DATA History / Diagnosis:    ICD-10-CM   1. Capsulitis of foot, left  M77.8     2. Cavus deformity of foot, acquired  M21.6X9     3. Tendonitis  M77.9     4. Plantar fasciitis  M72.2     5. Metatarsalgia of both feet  M77.41    M77.42       Change in Pathology: None  ACTIONS PERFORMED Patient's equipment was checked for structural stability and fit. Took device to be sent to manufacturing for adjustment  PLAN 6 weeks for refit Plan of care discussed with and agreed upon by patient / caregiver.

## 2022-06-24 ENCOUNTER — Ambulatory Visit (INDEPENDENT_AMBULATORY_CARE_PROVIDER_SITE_OTHER)

## 2022-06-24 DIAGNOSIS — M778 Other enthesopathies, not elsewhere classified: Secondary | ICD-10-CM

## 2022-06-24 NOTE — Progress Notes (Signed)
Patient presents today to pick up custom molded foot orthotics, diagnosed with capsulitis by Dr. Ardelle Anton.   Orthotics were dispensed and fit was satisfactory. Reviewed instructions for break-in and wear. Written instructions given to patient.  Patient will follow up as needed.   [facility] - order # [id number]

## 2022-07-01 ENCOUNTER — Other Ambulatory Visit

## 2022-07-08 ENCOUNTER — Ambulatory Visit: Admitting: Podiatry

## 2022-07-16 ENCOUNTER — Telehealth: Payer: Self-pay

## 2022-07-16 DIAGNOSIS — I872 Venous insufficiency (chronic) (peripheral): Secondary | ICD-10-CM

## 2022-07-16 NOTE — Telephone Encounter (Signed)
Pt called requesting an appt for venous reflux and appt to see [place], [place].  Reviewed pt's chart, returned call for clarification, no answer, lf vm.  Called pt, 2nd attempt, no answer, lf vm.  Pt returned call, two identifiers used. Pt stated that she was having leg swelling and Maureen, PA had advised to repeat the reflux study if she developed worsening symptoms. Appts for [place] and PA scheduled. Pt instructed to use compression wraps/stockings and elevate when possible. Confirmed understanding.

## 2022-07-19 ENCOUNTER — Ambulatory Visit (INDEPENDENT_AMBULATORY_CARE_PROVIDER_SITE_OTHER): Admitting: Podiatry

## 2022-07-19 DIAGNOSIS — L989 Disorder of the skin and subcutaneous tissue, unspecified: Secondary | ICD-10-CM

## 2022-07-19 DIAGNOSIS — M778 Other enthesopathies, not elsewhere classified: Secondary | ICD-10-CM

## 2022-07-19 DIAGNOSIS — M722 Plantar fascial fibromatosis: Secondary | ICD-10-CM

## 2022-07-19 DIAGNOSIS — M779 Enthesopathy, unspecified: Secondary | ICD-10-CM

## 2022-07-19 DIAGNOSIS — M19072 Primary osteoarthritis, left ankle and foot: Secondary | ICD-10-CM

## 2022-07-19 DIAGNOSIS — M216X9 Other acquired deformities of unspecified foot: Secondary | ICD-10-CM

## 2022-07-19 NOTE — Patient Instructions (Signed)
I have ordered a MRI of the left foot. If you do not hear for them about scheduling within the next 1 week, or you have any questions please give [place] a call at [phone].   Take dressing off in 8 hours and wash the foot with soap and water. If it is hurting or becomes uncomfortable before the 8 hours, go ahead and remove the bandage and wash the area.  If it blisters, apply antibiotic ointment and a band-aid.  Monitor for any signs/symptoms of infection. Call the office immediately if any occur or go directly to the emergency room. Call with any questions/concerns.

## 2022-07-19 NOTE — Progress Notes (Unsigned)
Subjective: Chief Complaint  Patient presents with   Foot Pain    Pt came in today for a follow-up left top of the pain, anytime tthe patient is resting there is pain, Rate of pain is a 7 out of 10, the pain wakes her out of her sleep, Patient is doing the same as the last visit, pt is no longer taking the meloxicam, or Icing but pt is using topical medications    76 year old female presents the office today for her above complaints.  She still gets quite a discomfort in the top of her left foot.  She states that she is still getting discomfort but no new injuries or concerns and is concerned that the symptoms are still going on.  Also she is concerned about the orthotic on the left side but she is still rolling out.  The skin lesion on the right heel is also causing discomfort.  Objective: AAO x3, NAD DP/PT pulses palpable bilaterally, CRT less than 3 seconds Cavus foot type is present and there is still continuation discomfort on the dorsal aspect of the left midfoot.  There is no specific area pinpoint tenderness to the tenderness is localized along the area of the Lisfranc joint.  There is no significant edema present and there is no erythema or warmth.  Clinically the tendons appear to be intact.  Upon palpation of the superficial peroneal nerve she is getting some nerve symptoms into the toes as well. Punctate annular hyperkeratotic lesion present plantar heel which is causing discomfort on the right side.  No underlying ulceration drainage or signs of infection. No pain with calf compression, swelling, warmth, erythema  Assessment: Cavus foot type, Capsulitis left midfoot, neuritis; skin lesion right heel  Plan: -All treatment options discussed with the patient including all alternatives, risks, complications.  -In regards to the left foot I again reviewed the x-rays with her.  Given her ongoing nature of the symptoms have ordered an MRI to further evaluate the midfoot pain, arthritis.   MRI ordered today.  I also added a lateral post to the orthotic. -Debrided the hyperkeratotic lesion right heel without any complications or bleeding. -Patient encouraged to call the office with any questions, concerns, change in symptoms.   35 minutes spent  Vivi Barrack DPM

## 2022-07-22 NOTE — Progress Notes (Signed)
[facility] [place]     History of Present Illness  Rebecca Lewis is a 76 y.o. female who presents with chief complaint: swollen leg.  She was seen by me on 07/13/2021  with left LE edema.  She denise skin changes, open wounds and no weeping.  He venous reflux is from mid thigh GSV to  the calf  without vein size increases and no SFJ reflux.  She was given knee high compression 15-20 mm hg to try.   She is wearing her compression and exercising daily.  She states she gets tired faster, but is still able to ambulate 2-3 miles a day.    She is managed with statin for Hyperlipidemia  She thinks she has increased edema in the left LE and wanted to be re evaluated.   Past Medical History:  Diagnosis Date   Abnormal liver function test 08/12/2015   Arthritis    GERD (gastroesophageal reflux disease)    H/O measles    H/O mumps    History of chicken pox 05/18/2015   Hyperglycemia 05/12/2015   Hyperlipidemia    Kidney stones    Neck pain 05/12/2015   Overweight 08/12/2015   Pain in joint, shoulder region 05/12/2015   right   Screen for colon cancer 05/18/2015   TMJ (temporomandibular joint syndrome) 05/18/2015   UTI (lower urinary tract infection)     Past Surgical History:  Procedure Laterality Date   APPENDECTOMY  1966   arthrotomy and menisectomy Right 05/01/15   CARPAL TUNNEL RELEASE  09/15/04   CATARACT EXTRACTION Right 03/03/09   CATARACT EXTRACTION Left 03/17/09   ENDOSCOPIC PLANTAR FASCIOTOMY Right 06/21/05   epicondylitis  08/28/97   Left arm, tennis elbow release   EXTRACORPOREAL SHOCK WAVE LITHOTRIPSY Right 02/27/09   hammer toes Right 03/03/09   right foot 2 hammer toes corrected   LITHOTRIPSY Right 04/09/14   Neck fusion  6/14.07   C5, C6, C7   right ulna shortening Right 11/02/07   SEPTOPLASTY  03/11/10   SHOULDER SURGERY Right 12/09/10   TMJ ARTHROPLASTY Left 03/27/13   ULNAR SHORTENING WITH BONE GRAFT  08/28/02    Social History   Socioeconomic History    Marital status: Divorced    Spouse name: Not on file   Number of children: Not on file   Years of education: Not on file   Highest education level: Not on file  Occupational History   Occupation: retired  Tobacco Use   Smoking status: Former   Smokeless tobacco: Never   Tobacco comments:    quit in 1977  Substance and Sexual Activity   Alcohol use: No    Alcohol/week: 0.0 standard drinks of alcohol   Drug use: No   Sexual activity: Yes    Birth control/protection: None    Comment: lives alon, no dietary restrictions  Other Topics Concern   Not on file  Social History Narrative   Not on file   Social Determinants of Health   Financial Resource Strain: Not on file  Food Insecurity: Not on file  Transportation Needs: Not on file  Physical Activity: Not on file  Stress: Not on file  Social Connections: Not on file  Intimate Partner Violence: Not on file    Family History  Problem Relation Age of Onset   Cancer Mother        lymphoma, melanoma   Heart disease Father 15       MI, arteriothrombosis   Cancer Sister  lung?   Heart disease Brother    Heart disease Son    Heart disease Paternal Uncle    Alcohol abuse Maternal Grandfather    Heart disease Maternal Grandfather    Dementia Paternal Grandmother    Dementia Paternal Grandfather     Current Outpatient Medications on File Prior to Visit  Medication Sig Dispense Refill   Bromfenac Sodium (PROLENSA) 0.07 % SOLN Apply to eye.     calcium elemental as carbonate (BARIATRIC TUMS ULTRA) 400 MG chewable tablet Chew by mouth.     Cholecalciferol 50 MCG (2000 UT) TBDP Take 1 tablet by mouth daily.     Chromium Picolinate 500 MCG CAPS Take 1,000 mcg by mouth daily.     diclofenac Sodium (VOLTAREN) 1 % GEL Apply 2 g topically 4 (four) times daily. Rub into affected area of foot 2 to 4 times daily 100 g 2   dorzolamide (TRUSOPT) 2 % ophthalmic solution Apply to eye.     Ferrous Sulfate (IRON) 325 (65 FE) MG TABS  Take 1 tablet by mouth every 3 (three) days.     Flaxseed, Linseed, (FLAXSEED OIL) 1000 MG CAPS Take 1 tablet by mouth daily.     ibuprofen (ADVIL) 800 MG tablet Take 1 tablet (800 mg total) by mouth every 8 (eight) hours as needed. 30 tablet 0   lidocaine (LIDODERM) 5 % Place 1 patch onto the skin daily. 30 patch 1   Magnesium Citrate 100 MG TABS Take 200 mg by mouth 2 (two) times daily.     Omega-3 Fatty Acids (FISH OIL) 1000 MG CAPS Take 950 mg by mouth daily.     Potassium 99 MG TABS Take 1 tablet by mouth daily.     rosuvastatin (CRESTOR) 10 MG tablet Take by mouth.      Turmeric 450 MG CAPS Take by mouth.     UNABLE TO FIND      UNABLE TO FIND Take by mouth.     UNABLE TO FIND Take by mouth.     vitamin B-12 (CYANOCOBALAMIN) 1000 MCG tablet Take 1,000 mcg by mouth every 3 (three) days.     ZIOPTAN 0.0015 % SOLN      bupivacaine (MARCAINE) 0.25 % injection by Epidural route.     meloxicam (MOBIC) 15 MG tablet Take 15 mg by mouth daily. (Patient not taking: Reported on 07/13/2021)     meloxicam (MOBIC) 7.5 MG tablet Take 7.5 mg by mouth daily. (Patient not taking: Reported on 07/13/2021)     No current facility-administered medications on file prior to visit.    Allergies as of 07/23/2022 - Review Complete 07/23/2022  Allergen Reaction Noted   Vicodin [hydrocodone-acetaminophen] Nausea And Vomiting 10/09/2013   Wasp venom Swelling 11/14/2020   Aleve [naproxen sodium]  08/12/2015   Bee venom Other (See Comments) 08/15/2015   Ibuprofen  08/12/2015   Poison ivy extract [poison ivy extract]  10/09/2013   Pregabalin  05/05/2017   Tramadol  03/09/2017     ROS:   General:  No weight loss, Fever, chills  HEENT: No recent headaches, no nasal bleeding, no visual changes, no sore throat  Neurologic: No dizziness, blackouts, seizures. No recent symptoms of stroke or mini- stroke. No recent episodes of slurred speech, or temporary blindness.  Cardiac: No recent episodes of chest  pain/pressure, no shortness of breath at rest.  No shortness of breath with exertion.  Denies history of atrial fibrillation or irregular heartbeat  Vascular: No history of rest pain in  feet.  No history of claudication.  No history of non-healing ulcer, No history of DVT   Pulmonary: No home oxygen, no productive cough, no hemoptysis,  No asthma or wheezing  Musculoskeletal:  [ x] Arthritis, [ ]  Low back pain,  [ x] Joint pain  Hematologic:No history of hypercoagulable state.  No history of easy bleeding.  No history of anemia  Gastrointestinal: No hematochezia or melena,  No gastroesophageal reflux, no trouble swallowing  Urinary: [ ]  chronic Kidney disease, [ ]  on HD - [ ]  MWF or [ ]  TTHS, [ ]  Burning with urination, [ ]  Frequent urination, [ ]  Difficulty urinating;   Skin: No rashes  Psychological: No history of anxiety,  No history of depression  Physical Examination  Vitals:   07/23/22 1034  BP: (!) 140/72  Pulse: 61  Resp: 14  Temp: (!) 97.5 F (36.4 C)  TempSrc: Temporal  SpO2: 98%  Weight: 157 lb (71.2 kg)  Height: 5\' 5"  (1.651 m)    Body mass index is 26.13 kg/m.  General:  Alert and oriented, no acute distress HEENT: Normal Neck: No bruit or JVD Pulmonary: Clear to auscultation bilaterally Cardiac: Regular Rate and Rhythm without murmur Abdomen: Soft, non-tender, non-distended, no mass, no scars Skin: No rash Extremity Pulses:   radial, femoral, dorsalis pedis, posterior tibial pulses bilaterally Musculoskeletal: no frank edema B LE  Neurologic: Upper and lower extremity motor 5/5 and symmetric  DATA:  +--------------+---------+------+-----------+------------+---------+  LEFT          Reflux NoRefluxReflux TimeDiameter cmsComments                           Yes                                    +--------------+---------+------+-----------+------------+---------+  CFV                     yes   >1 second                         +--------------+---------+------+-----------+------------+---------+  FV mid                  yes   >1 second                        +--------------+---------+------+-----------+------------+---------+  Popliteal     no                                               +--------------+---------+------+-----------+------------+---------+  GSV at [facility]    no                            0.66               +--------------+---------+------+-----------+------------+---------+  GSV prox thighno                            0.48               +--------------+---------+------+-----------+------------+---------+  GSV mid thigh           yes    >500  ms      0.33               +--------------+---------+------+-----------+------------+---------+  GSV dist thigh          yes    >500 ms      0.37               +--------------+---------+------+-----------+------------+---------+  GSV at knee             yes    >500 ms      0.40               +--------------+---------+------+-----------+------------+---------+  GSV prox calf           yes    >500 ms      0.33               +--------------+---------+------+-----------+------------+---------+  GSV mid calf            yes    >500 ms      0.30               +--------------+---------+------+-----------+------------+---------+  SSV Pop Fossa                                       too small  +--------------+---------+------+-----------+------------+---------+  SSV prox calf                                       too small  +--------------+---------+------+-----------+------------+---------+  SSV mid calf  no                            0.26               +--------------+---------+------+-----------+------------+---------+  AASV o        no                            0.29               +--------------+---------+------+-----------+------------+---------+  AASV p        no                             0.24               +--------------+---------+------+-----------+------------+---------+          Summary:  Left:  - No evidence of deep vein thrombosis seen in the left lower extremity,  from the common femoral through the popliteal veins.  - No evidence of superficial venous reflux seen in the left short  saphenous vein.  - Venous reflux is noted in the left common femoral vein.  - Venous reflux is noted in the left greater saphenous vein in the thigh.  - Venous reflux is noted in the left greater saphenous vein in the calf.  - Venous reflux is noted in the left femoral vein.      Assessment/Plan: Venous reflux with mild edema left LE No change in the venous reflux study.  No skin changes, no wounds and no frank edema.  She continues to have easily palpable pedal pulses.    Continue to stay active on a daily basis. F/U PRN If she develops edema, non healing  wounds or skin changes she will call.    Roxy Horseman PA=-C [facility] [place] Office: [phone]  MD on call Trula Rampersad

## 2022-07-23 ENCOUNTER — Encounter: Payer: Self-pay | Admitting: Physician Assistant

## 2022-07-23 ENCOUNTER — Ambulatory Visit (INDEPENDENT_AMBULATORY_CARE_PROVIDER_SITE_OTHER): Payer: POS | Admitting: Physician Assistant

## 2022-07-23 ENCOUNTER — Ambulatory Visit (HOSPITAL_COMMUNITY)
Admission: RE | Admit: 2022-07-23 | Discharge: 2022-07-23 | Disposition: A | Discharge status: Home / Self Care | Payer: POS | Source: Ambulatory Visit | Attending: Vascular Surgery | Admitting: Vascular Surgery

## 2022-07-23 VITALS — BP 140/72 | HR 61 | Temp 97.5°F | Resp 14 | Ht 65.0 in | Wt 157.0 lb

## 2022-07-23 DIAGNOSIS — I872 Venous insufficiency (chronic) (peripheral): Secondary | ICD-10-CM | POA: Diagnosis not present

## 2022-08-05 ENCOUNTER — Telehealth: Payer: Self-pay | Admitting: *Deleted

## 2022-08-05 NOTE — Telephone Encounter (Signed)
Patient for status of MRI scheduling, has not heard anything.  Called [facility] and they said that they have tried to contact patient twice, explained to patient that before scheduling, will need name of adjustor, fx #ph #, workman's comp company, claim ID, date of injury. Explained this to patient and she said that she feels that she should not have to give this information, someone in office should have this and send to Imaging so that she may get her MRI scheduled,please advise.

## 2022-08-09 NOTE — Telephone Encounter (Signed)
Received a call from [place] requesting why she needs to give the W/C information to [facility]. I called [facility] and give them the information they needed to schedule her MRI. Shakiera is aware and will wait for the call to schedule her MRI.

## 2022-08-20 ENCOUNTER — Encounter: Payer: Self-pay | Admitting: Podiatry

## 2022-09-21 ENCOUNTER — Ambulatory Visit
Admission: RE | Admit: 2022-09-21 | Discharge: 2022-09-21 | Disposition: A | Discharge status: Home / Self Care | Payer: Worker's Compensation | Source: Ambulatory Visit | Attending: Podiatry | Admitting: Podiatry

## 2022-09-21 DIAGNOSIS — M19072 Primary osteoarthritis, left ankle and foot: Secondary | ICD-10-CM

## 2022-09-28 ENCOUNTER — Ambulatory Visit (INDEPENDENT_AMBULATORY_CARE_PROVIDER_SITE_OTHER): Admitting: Podiatry

## 2022-09-28 DIAGNOSIS — M216X9 Other acquired deformities of unspecified foot: Secondary | ICD-10-CM | POA: Diagnosis not present

## 2022-09-28 DIAGNOSIS — M19072 Primary osteoarthritis, left ankle and foot: Secondary | ICD-10-CM

## 2022-09-28 DIAGNOSIS — M722 Plantar fascial fibromatosis: Secondary | ICD-10-CM

## 2022-09-28 NOTE — Patient Instructions (Signed)
You can use UREA NAIL GEL on the toenails

## 2022-09-28 NOTE — Progress Notes (Signed)
Subjective: Chief Complaint  Patient presents with   Arthritis    Left foot pain, rate of pain 8 out of 10, MRI results     76 year old female presents the office today for her above complaints.  She states that she is still having pain in the left foot.  She is getting pain on the top of her foot.  After discussion regards to the MRI she feels that she is having same symptoms of the right heel she is a nerve entrapment on the right side as well.  Objective: AAO x3, NAD DP/PT pulses palpable bilaterally, CRT less than 3 seconds Cavus foot type is present and there is still continuation discomfort on the dorsal aspect of the left midfoot.  No area pinpoint tenderness otherwise.  There is no significant edema present and there is no erythema or warmth.  Clinically the tendons appear to be intact Discomfort present still on the right heel as well the plantar aspect of the insertion of the calcaneus.  No pain with lateral compression of calcaneus. No pain with calf compression, swelling, warmth, erythema  Assessment: Cavus foot type, Capsulitis left midfoot, neuritis; skin lesion right heel  Plan: -All treatment options discussed with the patient including all alternatives, risks, complications.  -I reviewed the MRI with her.  I do think the majority her symptoms are more from arthritis.  Still some present of the nerve entrapment or Baxters neuritis but not significant pain in that area today.  Discussed continued shoes, arch support.  Can also consider surgical invention if needed.  She is concerned about the same symptoms on the right side as she is been having heel pain on that side.  Ordered MRI of the right side to rule out tarsal tunnel, plantar fascia tear. -We will send back 1 pair of orthotics for her as a metatarsal bar was too much.  30 minutes were spent with the patient.  Trula Schwenke DPM      -In regards to the left foot I again reviewed the x-rays with her.  Given her  ongoing nature of the symptoms have ordered an MRI to further evaluate the midfoot pain, arthritis.  MRI ordered today.  I also added a lateral post to the orthotic. -Debrided the hyperkeratotic lesion right heel without any complications or bleeding. -Patient encouraged to call the office with any questions, concerns, change in symptoms.   35 minutes spent  Trula Obar DPM

## 2022-10-13 ENCOUNTER — Telehealth: Payer: Self-pay | Admitting: Podiatry

## 2022-10-13 NOTE — Telephone Encounter (Signed)
Left message on vm to call back to schedule appt for picking up orthotics .    Charges need to be dropped.

## 2022-10-18 ENCOUNTER — Telehealth: Payer: Self-pay | Admitting: *Deleted

## 2022-10-18 NOTE — Telephone Encounter (Signed)
Patient is calling for clarification of the MRI of the ankle order, since it is her foot that is giving her the problem more so than the ankle, please advise.

## 2022-10-19 NOTE — Telephone Encounter (Signed)
Patient has been updated of physician's instructions thru voice message.

## 2022-10-20 ENCOUNTER — Ambulatory Visit: Payer: Self-pay | Admitting: *Deleted

## 2022-10-20 DIAGNOSIS — M722 Plantar fascial fibromatosis: Secondary | ICD-10-CM

## 2022-10-20 NOTE — Progress Notes (Signed)
Patient presents today to pick up custom molded foot orthotics, diagnosed with plantar fasciitis by Dr. Wagoner.   Orthotics were dispensed and fit was satisfactory. Reviewed instructions for break-in and wear. Written instructions given to patient.  Patient will follow up as needed.      

## 2022-10-27 ENCOUNTER — Ambulatory Visit
Admission: RE | Admit: 2022-10-27 | Discharge: 2022-10-27 | Disposition: A | Discharge status: Home / Self Care | Payer: Worker's Compensation | Source: Ambulatory Visit | Attending: Podiatry | Admitting: Podiatry

## 2022-10-27 DIAGNOSIS — M722 Plantar fascial fibromatosis: Secondary | ICD-10-CM

## 2022-10-28 ENCOUNTER — Other Ambulatory Visit: Payer: POS

## 2022-11-02 ENCOUNTER — Ambulatory Visit (INDEPENDENT_AMBULATORY_CARE_PROVIDER_SITE_OTHER): Admitting: Podiatry

## 2022-11-02 ENCOUNTER — Ambulatory Visit: Payer: Self-pay | Admitting: Podiatry

## 2022-11-02 VITALS — BP 182/72 | HR 76 | Resp 17

## 2022-11-02 DIAGNOSIS — M722 Plantar fascial fibromatosis: Secondary | ICD-10-CM

## 2022-11-02 DIAGNOSIS — M216X9 Other acquired deformities of unspecified foot: Secondary | ICD-10-CM | POA: Diagnosis not present

## 2022-11-02 NOTE — Progress Notes (Signed)
Subjective: Chief Complaint  Patient presents with   Plantar Fasciitis    Left foot pain, patient is doing better this visit    76 year old female presents the office today for follow-up evaluation as well as to discuss MRI results.  She states she is on Celebrex 100 mg twice daily for the knee.  Objective: AAO x3, NAD DP/PT pulses palpable bilaterally, CRT less than 3 seconds Right side there is a negative Tinel's sign.  She does get this going on plantar aspect of the heel.  There is no area pinpoint tenderness noted today.  There is no skin edema is no erythema.  Flexor, extensor tendons appear to be intact.  MMT 5/5.  Cavus foot type is present. No pain with calf compression, swelling, warmth, erythema  Assessment: Plantar fascia: Cavus foot type  Plan: -All treatment options discussed with the patient including all alternatives, risks, complications.  -I have reviewed the MRI with her.  Short segment peroneal tear noted.  She previously had this repaired.  No significant pain to this area today.  We discussed continuing with the Celebrex as needed as well as symptoms and continue with orthotics.  Discussed trying to modify her activities and not do walking every day trying to incorporate other activities which she has been doing such as biking or elliptical.   -Patient encouraged to call the office with any questions, concerns, change in symptoms.   Vivi Barrack DPM

## 2023-04-08 ENCOUNTER — Ambulatory Visit (INDEPENDENT_AMBULATORY_CARE_PROVIDER_SITE_OTHER): Payer: Medicare Other | Admitting: Podiatry

## 2023-04-08 DIAGNOSIS — B351 Tinea unguium: Secondary | ICD-10-CM | POA: Diagnosis not present

## 2023-04-08 DIAGNOSIS — M792 Neuralgia and neuritis, unspecified: Secondary | ICD-10-CM | POA: Diagnosis not present

## 2023-04-08 DIAGNOSIS — L600 Ingrowing nail: Secondary | ICD-10-CM | POA: Diagnosis not present

## 2023-04-08 NOTE — Progress Notes (Unsigned)
Subjective: Chief Complaint  Patient presents with   Ankle Pain    bil foot/ankle pain - patient wants to have her orthotics checked - she is also having a burning sensation in her right heel   77 year old female presents the office for above concerns.  She states she is still getting burning more to the right heel posteriorly.  Does not occur all the time like the right.  She is also has a history of back issues.  No recent injuries.  Also ingrown toenail pain on the left big toe.  No drainage or pus.  The nail itself has gotten thicker over time.   Objective: AAO x3, NAD DP/PT pulses palpable bilaterally, CRT less than 3 seconds Cavus foot type is present.  Not able to elicit any area pinpoint tenderness.  There is negative Tinel sign.  Sensation appears to be intact with Phoebe Perch monofilament.  Clinically flexor, extensor tendons are intact.  MMT 5/5. Describing burning sensation to the posterior heel Bilateral hallux nails are hypertrophic, dystrophic yellow discoloration there is mild incurvation of the left hallux nail without any tenderness today.  There is no edema no erythema no drainage or pus or signs of infection. No open lesions or pre-ulcerative lesions.  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  -Standing burning to the heel she does have a history of low back issues.  Recommend NCV which will be ordered.  Offloading pads were dispensed for the heel.  Continue with shoes, arch support.  I evaluated her orthotics and 1 orthotic is not comfortable and I took pictures of the inserts and we will send them back for modifications. -Order compound cream to [facility] for nail fungus.  Monitor for any signs or symptoms of infection.  If needed may  need to proceed with partial nail avulsion. -Patient encouraged to call the office with any questions, concerns, change in symptoms.   Vivi Barrack DPM

## 2023-04-14 ENCOUNTER — Encounter: Payer: Self-pay | Admitting: Podiatry

## 2023-04-21 ENCOUNTER — Ambulatory Visit: Payer: Commercial Managed Care - PPO | Admitting: Podiatry

## 2023-04-21 DIAGNOSIS — M792 Neuralgia and neuritis, unspecified: Secondary | ICD-10-CM

## 2023-04-21 DIAGNOSIS — L600 Ingrowing nail: Secondary | ICD-10-CM

## 2023-04-21 DIAGNOSIS — B351 Tinea unguium: Secondary | ICD-10-CM

## 2023-04-21 NOTE — Progress Notes (Signed)
Referring-Rebecca Ryter-Brown MD Reason for referral-chest pain  HPI: 77 year old female for evaluation of chest pain at request of [name], MD. Patient states she has had intermittent chest pain since February.  It is in the left breast area, left substernal area and under left breast.  It occurs both with exertion and at rest.  Typically lasts 2 minutes and resolve spontaneously.  It is not related to food and has no associated symptoms.  Question increased with inspiration.  She has some dyspnea at times but describes it as decreased flow through her nose.  Occasional minimal pedal edema.  No syncope.  Cardiology now asked to evaluate.  Current Outpatient Medications  Medication Sig Dispense Refill   Bromfenac Sodium (PROLENSA) 0.07 % SOLN Apply to eye.     Chromium Picolinate 500 MCG CAPS Take 1,000 mcg by mouth daily.     diclofenac Sodium (VOLTAREN) 1 % GEL Apply 2 g topically 4 (four) times daily. Rub into affected area of foot 2 to 4 times daily 100 g 2   dorzolamide (TRUSOPT) 2 % ophthalmic solution Apply to eye.     Ferrous Sulfate (IRON) 325 (65 FE) MG TABS Take 1 tablet by mouth every 3 (three) days.     Flaxseed, Linseed, (FLAXSEED OIL) 1000 MG CAPS Take 1 tablet by mouth daily.     ibuprofen (ADVIL) 800 MG tablet Take 1 tablet (800 mg total) by mouth every 8 (eight) hours as needed. 30 tablet 0   L-Methylfolate-B6-B12 (FOLTX) 1.13-25-2 MG TABS TAKE 1 TABLET EVERY THIRD DAY     Magnesium Citrate 100 MG TABS Take 200 mg by mouth 2 (two) times daily.     Omega-3 Fatty Acids (FISH OIL) 1000 MG CAPS Take 950 mg by mouth daily.     Potassium 99 MG TABS Take 1 tablet by mouth daily.     rosuvastatin (CRESTOR) 10 MG tablet Take by mouth.      Turmeric 450 MG CAPS Take by mouth.     vitamin B-12 (CYANOCOBALAMIN) 1000 MCG tablet Take 1,000 mcg by mouth every 3 (three) days.     ZIOPTAN 0.0015 % SOLN      bupivacaine (MARCAINE) 0.25 % injection by Epidural route.      calcium elemental as carbonate (BARIATRIC TUMS ULTRA) 400 MG chewable tablet Chew by mouth. (Patient not taking: Reported on 04/28/2023)     Cholecalciferol 50 MCG (2000 UT) TBDP Take 1 tablet by mouth daily.     lidocaine (LIDODERM) 5 % Place 1 patch onto the skin daily. (Patient not taking: Reported on 04/28/2023) 30 patch 1   meloxicam (MOBIC) 15 MG tablet Take 15 mg by mouth daily. (Patient not taking: Reported on 07/13/2021)     meloxicam (MOBIC) 7.5 MG tablet Take 7.5 mg by mouth daily. (Patient not taking: Reported on 07/13/2021)     UNABLE TO FIND  (Patient not taking: Reported on 04/28/2023)     UNABLE TO FIND Take by mouth.     UNABLE TO FIND Take by mouth.     No current facility-administered medications for this visit.    Allergies  Allergen Reactions   Vicodin [Hydrocodone-Acetaminophen] Nausea And Vomiting    headache Headache  headache   Wasp Venom Swelling    No anaphylaxis No anaphylaxis    Aleve [Naproxen Sodium]     Pain across Forehead Pain across Forehead   Bee Venom Other (See Comments)    Other reaction(s): Other (See Comments) Other reaction(s): Unknown  Other reaction(s): Unknown   Ibuprofen     Pain across forhead Pain across forhead   Poison Ivy Extract [Poison Ivy Extract]    Pregabalin     Other reaction(s): Other (See Comments) Weight gain, depression.   Tramadol     Other reaction(s): Other (See Comments)     Past Medical History:  Diagnosis Date   Abnormal liver function test 08/12/2015   Arthritis    GERD (gastroesophageal reflux disease)    H/O measles    H/O mumps    History of chicken pox 05/18/2015   Hyperglycemia 05/12/2015   Hyperlipidemia    Kidney stones    Neck pain 05/12/2015   Overweight 08/12/2015   Pain in joint, shoulder region 05/12/2015   right   Screen for colon cancer 05/18/2015   TMJ (temporomandibular joint syndrome) 05/18/2015   UTI (lower urinary tract infection)     Past Surgical History:  Procedure Laterality  Date   APPENDECTOMY  1966   arthrotomy and menisectomy Right 05/01/15   CARPAL TUNNEL RELEASE  09/15/04   CATARACT EXTRACTION Right 03/03/09   CATARACT EXTRACTION Left 03/17/09   ENDOSCOPIC PLANTAR FASCIOTOMY Right 06/21/05   epicondylitis  08/28/97   Left arm, tennis elbow release   EXTRACORPOREAL SHOCK WAVE LITHOTRIPSY Right 02/27/09   hammer toes Right 03/03/09   right foot 2 hammer toes corrected   LITHOTRIPSY Right 04/09/14   Neck fusion  6/14.07   C5, C6, C7   right ulna shortening Right 11/02/07   SEPTOPLASTY  03/11/10   SHOULDER SURGERY Right 12/09/10   TMJ ARTHROPLASTY Left 03/27/13   ULNAR SHORTENING WITH BONE GRAFT  08/28/02    Social History   Socioeconomic History   Marital status: Divorced    Spouse name: Not on file   Number of children: 2   Years of education: Not on file   Highest education level: Not on file  Occupational History   Occupation: retired  Tobacco Use   Smoking status: Former   Smokeless tobacco: Never   Tobacco comments:    quit in 1977  Substance and Sexual Activity   Alcohol use: No    Alcohol/week: 0.0 standard drinks of alcohol   Drug use: No   Sexual activity: Yes    Birth control/protection: None    Comment: lives alon, no dietary restrictions  Other Topics Concern   Not on file  Social History Narrative   Not on file   Social Determinants of Health   Financial Resource Strain: Not on file  Food Insecurity: Not on file  Transportation Needs: Not on file  Physical Activity: Not on file  Stress: Not on file  Social Connections: Not on file  Intimate Partner Violence: Not on file    Family History  Problem Relation Age of Onset   Cancer Mother        lymphoma, melanoma   Heart disease Father 44       MI, arteriothrombosis   Cancer Sister        lung?   Heart disease Brother    Heart disease Son    Heart disease Paternal Uncle    Alcohol abuse Maternal Grandfather    Heart disease Maternal Grandfather    Dementia Paternal  Grandmother    Dementia Paternal Grandfather     ROS: no fevers or chills, productive cough, hemoptysis, dysphasia, odynophagia, melena, hematochezia, dysuria, hematuria, rash, seizure activity, orthopnea, PND, pedal edema, claudication. Remaining systems are negative.  Physical Exam:  Blood pressure (!) 180/70, pulse 73, height 5\' 5"  (1.651 m), weight 155 lb (70.3 kg), SpO2 97 %.  General:  Well developed/well nourished in NAD Skin warm/dry Patient not depressed No peripheral clubbing Back-normal HEENT-normal/normal eyelids Neck supple/normal carotid upstroke bilaterally; no bruits; no JVD; no thyromegaly chest - CTA/ normal expansion CV - RRR/normal S1 and S2; no murmurs, rubs or gallops;  PMI nondisplaced Abdomen -NT/ND, no HSM, no mass, + bowel sounds, no bruit 2+ femoral pulses, no bruits Ext-no edema, chords, 2+ DP Neuro-grossly nonfocal  ECG -normal sinus rhythm at a rate of 73, nonspecific ST changes.  Personally reviewed  A/P  1 chest pain-symptoms are atypical.  Electrocardiogram shows nonspecific ST changes.  I will arrange a cardiac CTA to rule out obstructive coronary disease.  2 hypertension-blood pressure is elevated today.  She will purchase a blood pressure cuff and check her blood pressure at home.  Can add medications based on trend.  3 hyperlipidemia-continue statin.  Olga Millers, MD

## 2023-04-21 NOTE — Progress Notes (Signed)
Subjective: Chief Complaint  Patient presents with   Nail Problem    Left hallux nail      77 year old female presents the office for above concerns.  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 claim.    She is concerned of the left big toenail.  Previously the nails that are little thickened starting to come off and discomfort.  Objective: AAO x3, NAD DP/PT pulses palpable bilaterally, CRT less than 3 seconds Cavus foot type is present.  Not able to elicit any area pinpoint tenderness.  There is negative Tinel sign.  Sensation appears to be intact with Phoebe Perch monofilament.  Clinically flexor, extensor tendons are intact.  MMT 5/5. Describing burning sensation to the posterior heel still present.  Bilateral hallux nails are hypertrophic, dystrophic yellow discoloration there is mild incurvation of the left hallux nail without any tenderness today.  Left hallux nail is loosened and the nail but only attached proximally.  Distal half of the nail is not attached.  No edema, erythema or signs of infection today. No open lesions or pre-ulcerative lesions.  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  -Standing burning to the heel she does have a history of low back issues.  Recommend NCV which will be ordered.  Will follow-up on this in regards to [employer].  -Debrided the left hallux nail any complications or bleeding.  Is able to debride the loose nail without any complications.  Discussed Epsom salt soaks.  Over the nail will continue to but may follow-up on its own.  Monitor for any signs or symptoms of infection should any occur may need to remove the entire nail.   -Patient encouraged to  call the office with any questions, concerns, change in symptoms.   Vivi Barrack DPM

## 2023-04-21 NOTE — Patient Instructions (Signed)

## 2023-04-28 ENCOUNTER — Telehealth: Payer: Self-pay | Admitting: Cardiology

## 2023-04-28 ENCOUNTER — Encounter: Payer: Self-pay | Admitting: Cardiology

## 2023-04-28 ENCOUNTER — Ambulatory Visit: Payer: 59 | Attending: Cardiology | Admitting: Cardiology

## 2023-04-28 VITALS — BP 180/70 | HR 73 | Ht 65.0 in | Wt 155.0 lb

## 2023-04-28 DIAGNOSIS — E7800 Pure hypercholesterolemia, unspecified: Secondary | ICD-10-CM | POA: Diagnosis not present

## 2023-04-28 DIAGNOSIS — R072 Precordial pain: Secondary | ICD-10-CM

## 2023-04-28 DIAGNOSIS — I10 Essential (primary) hypertension: Secondary | ICD-10-CM | POA: Diagnosis not present

## 2023-04-28 MED ORDER — METOPROLOL TARTRATE 100 MG PO TABS: ORAL_TABLET | ORAL | 0 refills | Status: AC

## 2023-04-28 NOTE — Telephone Encounter (Signed)
Patient requesting to speak with a nurse to update her medication list and go through them with her. Please advise.

## 2023-04-28 NOTE — Telephone Encounter (Signed)
-----   Message from Vivi Barrack, DPM sent at 04/27/2023  7:51 AM EDT ----- I saw Ms. Bamberger a few weeks back and I ordered a nerve conduction test with a [facility].  Hridaan Bouse, can you please contact them to make sure they got it. There is also a question if they take [employer].  This is under the [employer]. claim and that and normally she needs to be sent with the referral and not her regular insurance.  I was confused when I saw her on Thursday about insurance.  I believe 2 visits ago was supposed be filed under [employer]. and I am not sure if it was.

## 2023-04-28 NOTE — Addendum Note (Signed)
Addended by: Shayne Alken on: 04/28/2023 04:08 PM   Modules accepted: Orders

## 2023-04-28 NOTE — Patient Instructions (Signed)
    Testing/Procedures:   Your cardiac CT will be scheduled at   [facility] [address] [place], [place] [address] [phone]     If scheduled at [facility], please arrive at the [facility] and Children's Entrance (Entrance C2) of [facility] 30 minutes prior to test start time. You can use the FREE valet parking offered at entrance C (encouraged to control the heart rate for the test)  Proceed to the [facility] Radiology Department (first floor) to check-in and test prep.  All radiology patients and guests should use entrance C2 at [facility], accessed from [facility], even though the hospital's physical address listed is [address].       Please follow these instructions carefully (unless otherwise directed):   On the Night Before the Test: Be sure to Drink plenty of water. Do not consume any caffeinated/decaffeinated beverages or chocolate 12 hours prior to your test. Do not take any antihistamines 12 hours prior to your test.   On the Day of the Test: Drink plenty of water until 1 hour prior to the test. Do not eat any food 1 hour prior to test. You may take your regular medications prior to the test.  Take metoprolol (Lopressor) 100 mg two hours prior to test. FEMALES- please wear underwire-free bra if available, avoid dresses & tight clothing       After the Test: Drink plenty of water. After receiving IV contrast, you may experience a mild flushed feeling. This is normal. On occasion, you may experience a mild rash up to 24 hours after the test. This is not dangerous. If this occurs, you can take Benadryl 25 mg and increase your fluid intake. If you experience trouble breathing, this can be serious. If it is severe call 911 IMMEDIATELY. If it is mild, please call our office.   We will call to schedule your test 2-4 weeks out understanding that some insurance companies will need an  authorization prior to the service being performed.   For non-scheduling related questions, please contact the cardiac imaging nurse navigator should you have any questions/concerns: Rockwell [place], Cardiac Imaging Nurse Navigator Larey Brick, Cardiac Imaging Nurse Navigator [facility] Direct Office Dial: [phone]   For scheduling needs, including cancellations and rescheduling, please call [place], [phone].    Follow-Up: At [facility], you and your health needs are our priority.  As part of our continuing mission to provide you with exceptional heart care, we have created designated Provider Care Teams.  These Care Teams include your primary Cardiologist (physician) and Advanced Practice Providers (APPs -  Physician Assistants and Nurse Practitioners) who all work together to provide you with the care you need, when you need it.  We recommend signing up for the patient portal called "MyChart".  Sign up information is provided on this After Visit Summary.  MyChart is used to connect with patients for Virtual Visits (Telemedicine).  Patients are able to view lab/test results, encounter notes, upcoming appointments, etc.  Non-urgent messages can be sent to your provider as well.   To learn more about what you can do with MyChart, go to [email or web address].    Your next appointment:   12 month(s)  Provider:   Olga Millers, MD in [place]

## 2023-04-28 NOTE — Telephone Encounter (Signed)
Went over several medications.  She wanted to make sure it was updated correctly.  It was confirmed.

## 2023-04-29 NOTE — Addendum Note (Signed)
Addended by: Recardo Evangelist L on: 04/29/2023 11:13 AM   Modules accepted: Orders

## 2023-05-16 ENCOUNTER — Ambulatory Visit (HOSPITAL_COMMUNITY): Payer: 59

## 2023-05-24 ENCOUNTER — Ambulatory Visit (HOSPITAL_COMMUNITY): Payer: 59

## 2023-05-26 ENCOUNTER — Encounter: Payer: Self-pay | Admitting: *Deleted

## 2023-05-27 ENCOUNTER — Telehealth (HOSPITAL_COMMUNITY): Payer: Self-pay | Admitting: *Deleted

## 2023-05-27 NOTE — Telephone Encounter (Signed)
Attempted to call patient regarding upcoming cardiac CT appointment. Left message on voicemail with name and callback number Ridhi Hoffert RN Navigator Cardiac Imaging [facility] [phone] Office  

## 2023-05-30 ENCOUNTER — Ambulatory Visit (HOSPITAL_COMMUNITY)
Admission: RE | Admit: 2023-05-30 | Discharge: 2023-05-30 | Disposition: A | Discharge status: Home / Self Care | Payer: 59 | Source: Ambulatory Visit | Attending: Cardiology | Admitting: Cardiology

## 2023-05-30 DIAGNOSIS — R072 Precordial pain: Secondary | ICD-10-CM

## 2023-05-30 MED ORDER — NITROGLYCERIN 0.4 MG SL SUBL
SUBLINGUAL_TABLET | SUBLINGUAL | Status: AC
  Filled 2023-05-30: qty 2

## 2023-05-30 MED ORDER — IOHEXOL 350 MG/ML SOLN
95.0000 mL | Freq: Once | INTRAVENOUS | Status: AC | PRN
  Administered 2023-05-30: 95 mL via INTRAVENOUS

## 2023-05-30 MED ORDER — NITROGLYCERIN 0.4 MG SL SUBL
0.8000 mg | SUBLINGUAL_TABLET | Freq: Once | SUBLINGUAL | Status: AC
  Administered 2023-05-30: 0.8 mg via SUBLINGUAL

## 2023-05-31 ENCOUNTER — Telehealth: Payer: Self-pay | Admitting: Cardiology

## 2023-05-31 ENCOUNTER — Encounter: Payer: Self-pay | Admitting: Podiatry

## 2023-05-31 DIAGNOSIS — E7800 Pure hypercholesterolemia, unspecified: Secondary | ICD-10-CM

## 2023-05-31 MED ORDER — ROSUVASTATIN CALCIUM 20 MG PO TABS: 20.0000 mg | ORAL_TABLET | Freq: Every day | ORAL | 3 refills | Status: AC

## 2023-05-31 NOTE — Telephone Encounter (Signed)
Pt would like a callback regarding the medications she is taking currently. Please advise.

## 2023-05-31 NOTE — Telephone Encounter (Signed)
Patient aware of Cardiac CT results and provider recommendations. Rx for 20mg  of Crestor sent to pharmacy on file

## 2023-06-01 ENCOUNTER — Telehealth: Payer: Self-pay | Admitting: *Deleted

## 2023-06-01 DIAGNOSIS — E7800 Pure hypercholesterolemia, unspecified: Secondary | ICD-10-CM

## 2023-06-01 NOTE — Telephone Encounter (Signed)
Can anyone help Rebecca Lewis with her questions? Thanks so much!

## 2023-06-01 NOTE — Telephone Encounter (Signed)
Pt has reviewed results via my chart  New script sent to the pharmacy  Lab orders mailed to the pt  

## 2023-06-01 NOTE — Telephone Encounter (Signed)
-----   Message from Lewayne Bunting, MD sent at 05/30/2023  1:47 PM EDT ----- Minimal plaque.  Increase Crestor to 20 mg daily.  Check lipids and liver in 8 weeks. Olga Millers, MD

## 2023-06-08 ENCOUNTER — Telehealth: Payer: Self-pay | Admitting: Podiatry

## 2023-06-08 NOTE — Telephone Encounter (Signed)
Left message for pt that the orthotics we think were lost in the mail system and that we need to get her in to be recast for them and to call and I can get her scheduled to see Dr Irving Shows 7.17.24

## 2023-06-13 ENCOUNTER — Encounter: Payer: Self-pay | Admitting: Podiatry

## 2023-06-15 ENCOUNTER — Ambulatory Visit (INDEPENDENT_AMBULATORY_CARE_PROVIDER_SITE_OTHER): Payer: POS | Admitting: Podiatrist

## 2023-06-15 DIAGNOSIS — M722 Plantar fascial fibromatosis: Secondary | ICD-10-CM

## 2023-06-15 NOTE — Progress Notes (Signed)
Patient presents today for casting for orthotics.  Her refurbished pair was sent and was lost in transit.  She was scanned today using the [facility] scanner.  An order was placed for the orthotics. The orthotics she had with her today are comfortable for her so we are trying to duplicate those with a soft reverse mortons extension bilateral, intrinsic heel post with hole,  metatarsal pad bilateral and shell cut out at first ray, right only.  Photos taken and sent to lab as well for comparison.   Also, we discussed her nerve conduction study order-  I will ask Dr. Ardelle Anton to resubmit the order for her as she has spoken to her insurance adjuster.   We will call when orthotics are ready for pick up.

## 2023-06-21 ENCOUNTER — Telehealth: Payer: Self-pay | Admitting: Podiatry

## 2023-06-21 NOTE — Telephone Encounter (Signed)
The only place in [place] that accepts [facility] is [facility] @ [address], should I call them. # [phone]

## 2023-06-23 ENCOUNTER — Other Ambulatory Visit: Payer: Self-pay | Admitting: Podiatry

## 2023-06-23 DIAGNOSIS — M792 Neuralgia and neuritis, unspecified: Secondary | ICD-10-CM

## 2023-06-30 ENCOUNTER — Other Ambulatory Visit: Payer: Medicare Other

## 2023-06-30 NOTE — Progress Notes (Signed)
Patient presents today to pick up custom molded foot orthotics, diagnosed with Plantar Fasciitis by Dr. Ardelle Anton.   Orthotics were dispensed and fit was satisfactory. Reviewed instructions for break-in and wear. Written instructions given to patient.  Patient will follow up as needed.   [name] CPed, CFo, CFm

## 2023-07-11 ENCOUNTER — Other Ambulatory Visit: Payer: Medicare Other

## 2023-07-18 ENCOUNTER — Ambulatory Visit

## 2023-07-18 NOTE — Progress Notes (Signed)
Patient was here with concerns of Right orthotic not fitting properly I believe that MT pads need to be higher and orthotic arch needs to drop approx. 1`/8". She did not leave orthotic here because she wants to wear Left for another week or so to see if any adjustments are needed there   [name] Cped, CFo, CFm

## 2023-08-04 ENCOUNTER — Other Ambulatory Visit: Payer: Medicare Other

## 2023-08-04 ENCOUNTER — Ambulatory Visit: Payer: Medicare Other

## 2023-08-04 NOTE — Progress Notes (Signed)
Orthotics sent to [facility] for Modifications took copy of mods form and filed  Higher MT pads, 1/8" wider base with posting and deeper heel cups needed / higher flanges to hold heel  [name] Cped CFo, CFm

## 2023-08-08 ENCOUNTER — Encounter: Payer: Self-pay | Admitting: *Deleted

## 2023-09-21 ENCOUNTER — Ambulatory Visit: Payer: Medicare Other

## 2023-09-21 NOTE — Progress Notes (Signed)
Patient was here and fit with remade orthotics  Fit is good patient will wear and let me know if any problems arise  Rebecca Lewis CPed, CFo, CFm

## 2023-09-22 ENCOUNTER — Encounter: Payer: Self-pay | Admitting: Podiatry

## 2023-09-22 ENCOUNTER — Ambulatory Visit (INDEPENDENT_AMBULATORY_CARE_PROVIDER_SITE_OTHER): Payer: 59 | Admitting: Podiatry

## 2023-09-22 DIAGNOSIS — M779 Enthesopathy, unspecified: Secondary | ICD-10-CM

## 2023-09-22 DIAGNOSIS — M216X9 Other acquired deformities of unspecified foot: Secondary | ICD-10-CM | POA: Diagnosis not present

## 2023-09-22 DIAGNOSIS — M722 Plantar fascial fibromatosis: Secondary | ICD-10-CM

## 2023-09-22 NOTE — Patient Instructions (Signed)

## 2023-09-22 NOTE — Progress Notes (Signed)
Subjective: Chief Complaint  Patient presents with   Routine Post Op    PATIENT STATES THAT HER LF AND RF HAS BEEN HURTING , AND SHE WANTS TO TALK ABOUT HER RIGHT HEEL AND LEFT ANKLE .     77 year old female presents the office for above concerns.  She has had been getting more plantar fasciitis as well as peroneal tendinitis.  She has been having ongoing issues with her orthotics.  She recently just saw [place] yesterday and she was placed into the heels on the right side she states that since this she was able to walk and her symptoms have improved some.  No injuries.  Has not yet had a nerve conduction test performed but she states that she has been contacted about it.   Objective: AAO x3, NAD DP/PT pulses palpable bilaterally, CRT less than 3 seconds Cavus foot type is present.  Not able to elicit any area pinpoint tenderness.  There is negative Tinel sign.  Sensation appears to be intact with Phoebe Perch monofilament.   Having discomfort in the plantar aspect the right heel along the insertion of plantar fascia.  There is to the fat pad as well as a palpable plantar tubercle of the calcaneus is noted.  She does have some mild discomfort on the left side along the peroneal tendon just posterior to the malleolus.  There is no edema, erythema.  Clinically the tendon appears to be intact.  No erythema or tenderness.  Cavus foot type is present. No open lesions or pre-ulcerative lesions.  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  -Awaiting the NCV test -Regards her foot pain seems to be improving with slight lift on the right side.  Will continue with this in the current orthotic.  I have her follow-up with Nicki [place] if needed in a few weeks to see any additional  modifications to orthotics. -Continue stretching, icing on a regular basis as well as supportive shoe gear.  Vivi Barrack DPM

## 2023-10-13 ENCOUNTER — Ambulatory Visit: Payer: Medicare Other

## 2023-10-19 ENCOUNTER — Telehealth: Payer: Self-pay | Admitting: Podiatry

## 2023-10-19 NOTE — Telephone Encounter (Signed)
Pt called and has sent you a message and wanted to make sure you got it.

## 2023-10-31 ENCOUNTER — Ambulatory Visit: Payer: 59

## 2023-10-31 NOTE — Progress Notes (Signed)
Patient in to be re-measured for Right heel lift 1/2" discrepancy found  Patient has 1/8" lift currently she wears uncer right foot orthotic  [name] Cped, CFo, CFm

## 2023-11-07 ENCOUNTER — Other Ambulatory Visit: Payer: Medicare Other

## 2023-11-09 ENCOUNTER — Ambulatory Visit: Payer: Medicare Other

## 2023-11-14 ENCOUNTER — Other Ambulatory Visit: Payer: Medicare Other

## 2023-11-17 NOTE — Progress Notes (Signed)
Adjustments made

## 2023-11-21 ENCOUNTER — Ambulatory Visit: Payer: Medicare Other

## 2023-11-21 NOTE — Progress Notes (Signed)
Remaking orthotics in cork base to help relieve pressure from arthritic foot  Patient cannot adjust to rigid orthotics

## 2023-11-28 NOTE — Progress Notes (Signed)
NA

## 2024-01-16 ENCOUNTER — Telehealth: Payer: Self-pay

## 2024-01-16 ENCOUNTER — Other Ambulatory Visit: Payer: Medicare Other

## 2024-01-16 NOTE — Telephone Encounter (Signed)
 Left voicemail to reschedule

## 2024-01-23 ENCOUNTER — Ambulatory Visit: Payer: Medicare Other

## 2024-01-23 NOTE — Progress Notes (Signed)
 Patient presents today to pick up custom molded foot orthotics, diagnosed with Cavus by Dr. Ardelle Anton.   Orthotics were dispensed and fit was satisfactory. Reviewed instructions for break-in and wear. Written instructions given to patient.  Patient will follow up as needed.  [name] CPed CFo, CFm

## 2024-01-25 ENCOUNTER — Encounter: Payer: Self-pay | Admitting: Podiatry

## 2024-01-27 ENCOUNTER — Ambulatory Visit: Payer: Medicare Other

## 2024-02-06 ENCOUNTER — Other Ambulatory Visit: Payer: Medicare Other

## 2024-02-09 NOTE — Progress Notes (Signed)
 Remake of orthotics again sent back to [facility]anger  Rebecca Lewis Cped, CFo, CFm

## 2024-02-23 ENCOUNTER — Ambulatory Visit: Payer: Medicare Other

## 2024-02-24 NOTE — Progress Notes (Signed)
 Right inserts with modifications was fit today  Patient states she will try and will call if any problems arise

## 2024-03-09 ENCOUNTER — Ambulatory Visit

## 2024-03-09 NOTE — Progress Notes (Signed)
 Patient was here stating that new orthotic I delivered to her is not working still causing too much pressure on first MT head  Arch seems a little higher than orthotic she has been wearing and likes also she has been wearing 1/4" heel lift on this side (Rt) side  With arch being higher, and lift being higher I believe this is pushing her forward more and increasing pressure on 1st MT head  Therefore I heated arch to drop a bit and reduce pressure on 1st MT head  I suggested if this does not work that she may schedule with Dr Ardelle Anton to further evaluate.  This is 3rd set of orthotics I have remade for patient  Rebecca Lewis CPed, CFo, CFm

## 2024-03-26 ENCOUNTER — Encounter: Payer: Self-pay | Admitting: Podiatry

## 2024-03-26 ENCOUNTER — Ambulatory Visit: Admitting: Podiatry

## 2024-03-26 DIAGNOSIS — M216X9 Other acquired deformities of unspecified foot: Secondary | ICD-10-CM | POA: Diagnosis not present

## 2024-03-26 DIAGNOSIS — M7741 Metatarsalgia, right foot: Secondary | ICD-10-CM

## 2024-03-26 DIAGNOSIS — M7742 Metatarsalgia, left foot: Secondary | ICD-10-CM | POA: Diagnosis not present

## 2024-03-28 NOTE — Progress Notes (Signed)
 Subjective: Chief Complaint  Patient presents with   Foot Pain    RM#11 Right foot pain orthotics not working calluses on bottom of right foot.     78 year old female presents the office for above concerns.  Her main concern is that she is getting pain in the ball of her foot along the area she is getting calluses.  She has tried multiple orthotic modifications as well as padding without significant.  She also states she exhibits some nerve symptoms to her legs.  She is not reporting recent injuries.  The pain that she was having the lateral aspect of the ankle is much improved compared to what was previously.  She states the left that was made for her shoe does help and is asked about another.   Objective: AAO x3, NAD DP/PT pulses palpable bilaterally, CRT less than 3 seconds Cavus foot type is present.  Not able to elicit any area pinpoint tenderness.  There is negative Tinel sign.  Sensation appears to be intact with Charolett Copes monofilament.  She describes nerve type symptoms to her leg. Significant prominence of metatarsal heads plantarly to the fat pad resulting hyperkeratotic lesions without any underlying ulceration, drainage or signs of infection.  On the ball of the foot on the area the callus is where she has majority discomfort. No open lesions or pre-ulcerative lesions.  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  - In regards to nerve symptoms nerve next to his previously ordered.  Discussed becoming more other issues such as back and recommended to follow-up with with her PCP.  -Debride the calluses without any complications or bleeding.  I had a gel metatarsal pad that included toe spacers which were dispensed today.  I will try to work on making her new. Will also try to come up  with other solutions for her inserts (material).   Charity Conch DPM

## 2024-04-05 ENCOUNTER — Other Ambulatory Visit: Payer: Self-pay | Admitting: Podiatry

## 2024-04-05 DIAGNOSIS — M7741 Metatarsalgia, right foot: Secondary | ICD-10-CM

## 2024-04-05 DIAGNOSIS — M722 Plantar fascial fibromatosis: Secondary | ICD-10-CM

## 2024-04-05 DIAGNOSIS — M779 Enthesopathy, unspecified: Secondary | ICD-10-CM

## 2024-04-18 ENCOUNTER — Other Ambulatory Visit: Payer: Self-pay

## 2024-04-18 ENCOUNTER — Ambulatory Visit: Payer: Self-pay | Attending: Podiatry

## 2024-04-18 DIAGNOSIS — M25572 Pain in left ankle and joints of left foot: Secondary | ICD-10-CM | POA: Diagnosis present

## 2024-04-18 DIAGNOSIS — M7741 Metatarsalgia, right foot: Secondary | ICD-10-CM | POA: Insufficient documentation

## 2024-04-18 DIAGNOSIS — R293 Abnormal posture: Secondary | ICD-10-CM | POA: Diagnosis present

## 2024-04-18 DIAGNOSIS — M7742 Metatarsalgia, left foot: Secondary | ICD-10-CM | POA: Diagnosis not present

## 2024-04-18 DIAGNOSIS — M6281 Muscle weakness (generalized): Secondary | ICD-10-CM | POA: Insufficient documentation

## 2024-04-18 DIAGNOSIS — M25571 Pain in right ankle and joints of right foot: Secondary | ICD-10-CM | POA: Insufficient documentation

## 2024-04-18 DIAGNOSIS — M779 Enthesopathy, unspecified: Secondary | ICD-10-CM | POA: Insufficient documentation

## 2024-04-18 DIAGNOSIS — R2689 Other abnormalities of gait and mobility: Secondary | ICD-10-CM | POA: Diagnosis present

## 2024-04-18 DIAGNOSIS — M722 Plantar fascial fibromatosis: Secondary | ICD-10-CM | POA: Insufficient documentation

## 2024-04-18 NOTE — Therapy (Signed)
 OUTPATIENT PHYSICAL THERAPY LOWER EXTREMITY EVALUATION   Patient Name: Rebecca Lewis MRN: [id number] DOB:11/05/1946, 78 y.o., female Today's Date: 04/18/2024  END OF SESSION:  PT End of Session - 04/18/24 1142     Visit Number 1    Number of Visits 17    Date for PT Re-Evaluation 06/16/24    Authorization Type [employer]    PT Start Time 1142    PT Stop Time 1226    PT Time Calculation (min) 44 min    Activity Tolerance Patient tolerated treatment well    Behavior During Therapy WFL for tasks assessed/performed             Past Medical History:  Diagnosis Date   Abnormal liver function test 08/12/2015   Arthritis    GERD (gastroesophageal reflux disease)    H/O measles    H/O mumps    History of chicken pox 05/18/2015   Hyperglycemia 05/12/2015   Hyperlipidemia    Kidney stones    Neck pain 05/12/2015   Overweight 08/12/2015   Pain in joint, shoulder region 05/12/2015   right   Screen for colon cancer 05/18/2015   TMJ (temporomandibular joint syndrome) 05/18/2015   UTI (lower urinary tract infection)    Past Surgical History:  Procedure Laterality Date   APPENDECTOMY  1966   arthrotomy and menisectomy Right 05/01/15   CARPAL TUNNEL RELEASE  09/15/04   CATARACT EXTRACTION Right 03/03/09   CATARACT EXTRACTION Left 03/17/09   ENDOSCOPIC PLANTAR FASCIOTOMY Right 06/21/05   epicondylitis  08/28/97   Left arm, tennis elbow release   EXTRACORPOREAL SHOCK WAVE LITHOTRIPSY Right 02/27/09   hammer toes Right 03/03/09   right foot 2 hammer toes corrected   LITHOTRIPSY Right 04/09/14   Neck fusion  6/14.07   C5, C6, C7   right ulna shortening Right 11/02/07   SEPTOPLASTY  03/11/10   SHOULDER SURGERY Right 12/09/10   TMJ ARTHROPLASTY Left 03/27/13   ULNAR SHORTENING WITH BONE GRAFT  08/28/02   Patient Active Problem List   Diagnosis Date Noted   Pseudophakia of both eyes 12/04/2020   Aphakia of right eye 11/15/2020   Thoracic facet syndrome 07/22/2020   Cavus deformity of foot, acquired  12/23/2019   Spinal stenosis of cervical region 08/16/2019   Sprain of ligaments of thoracic spine 08/16/2019   Intraocular lens dislocation, initial encounter 07/02/2019   Myalgia 05/17/2019   Visual disturbance 05/17/2019   S/P cervical spinal fusion 08/02/2018   Chronic left shoulder pain 05/23/2018   Spondylosis of cervical 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 myelopathy or radiculopathy 05/23/2018   Whiplash injury to neck 05/23/2018   Work related injury 05/23/2018   Hammertoe of right foot 05/09/2018   Alterations of sensations 05/05/2018   Degenerative cervical disc 05/05/2018   Sacroiliitis (HCC) 05/05/2018   Vertigo 05/05/2018   Vitamin D deficiency 05/05/2018   Prediabetes 01/27/2018   Nausea 12/30/2017   New daily persistent headache 12/30/2017   Chronic or recurrent subluxation of carpometacarpal joint of thumb, right 05/10/2017   DDD (degenerative disc disease), lumbar 01/14/2017   Lumbar facet arthropathy 01/14/2017   Spondylosis 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 01/14/2017   Sacroiliac joint pain 10/14/2016   Osteopenia 04/23/2016   Sprain of ankle 04/08/2016   Post-operative state 04/08/2016   Tendon tear 01/20/2016   Spells 10/14/2015   Overweight 08/12/2015   Abnormal liver function test 08/12/2015   History of chicken pox 05/18/2015   Screen for colon cancer 05/18/2015   TMJ (temporomandibular joint  syndrome) 05/18/2015   H/O measles    Pain in joint, shoulder region 05/12/2015   Neck pain 05/12/2015   Hyperglycemia 05/12/2015   Arthritis    Hyperlipidemia    Kidney stones    GERD (gastroesophageal reflux disease)    Tendon tear, ankle, right, subsequent encounter 04/27/2015   Cramping of feet 05/07/2013   Brachial neuritis 10/19/2012   Essential tremor 10/19/2012   Left cervical radiculopathy 10/19/2012   Sprain and strain of shoulder and upper arm 03/02/2012   Sprain of left shoulder girdle 03/02/2012   Thoracic sprain and strain 03/02/2012   Left  knee pain 11/03/2011    PCP: Lorenso Romance, MD  REFERRING PROVIDER: Charity Conch, DPM  REFERRING DIAG:  [id number] (ICD-10-CM) - Metatarsalgia of both feet  M77.9 (ICD-10-CM) - Tendonitis  M72.2 (ICD-10-CM) - Plantar fasciitis    THERAPY DIAG:  Pain in left ankle and joints of left foot  Pain in right ankle and joints of right foot  Muscle weakness (generalized)  Other abnormalities of gait and mobility  Abnormal posture  Rationale for Evaluation and Treatment: Rehabilitation  ONSET DATE: chronic   SUBJECTIVE:   SUBJECTIVE STATEMENT: Patient reports she has had a lot of changes to the orthotics she has been wearing to find the right fit. She was also found to have a leg length discrepancy with the RLE being shorter. The orthotists was able to add a little heel lift, which has made some difference. She has had 1 tendon repair on the LLE and 2 on the RLE per patient to the peroneal tendon. She is noticing that her foot is caving inward when she stands. At her most recent visit with Dr. Clydia Dart she was told that the tendons have stretched on the Lt and recommended that she start with PT. The pain is mostly localized to the Lt lateral ankle. The Rt ankle/foot is doing ok with metatarsal pad, but it is very painful to be barefoot on the Rt foot.   PERTINENT HISTORY: Per patient 2 peroneal tendon repairs on RLE, 1 on the LLE  Rt plantar fasciotomy  Vertigo  PAIN:  Are you having pain? Yes: NPRS scale: 8 Pain location: left lateral ankle Pain description: "feels like somebody is pulling something tight"; burning Aggravating factors: certain positions laying in recliner, heavy cleaning activities Relieving factors: rest and support   PRECAUTIONS: None  RED FLAGS: None   WEIGHT BEARING RESTRICTIONS: No  FALLS:  Has patient fallen in last 6 months? No  LIVING ENVIRONMENT: Lives with: lives alone Lives in: House/apartment Stairs: Yes: External: 3 steps;  on left going up Has following equipment at home: None  OCCUPATION: retired   PLOF: Independent  PATIENT GOALS: "I want to find out why the ankle just continues to hurt and strengthen it."  NEXT MD VISIT: nothing scheduled   OBJECTIVE:  Note: Objective measures were completed at Evaluation unless otherwise noted.  DIAGNOSTIC FINDINGS: no recent imaging on file   PATIENT SURVEYS:  FADI: 63/100  COGNITION: Overall cognitive status: Within functional limits for tasks assessed     SENSATION: Not tested  EDEMA:  No obvious swelling about the ankles    POSTURE: pes cavus   LOWER EXTREMITY ROM:  Active ROM Right eval Left eval  Hip flexion    Hip extension    Hip abduction    Hip adduction    Hip internal rotation    Hip external rotation    Knee flexion    Knee  extension    Ankle dorsiflexion Lacking 2  Lacking 1   Ankle plantarflexion 72 74  Ankle inversion 16 16 pain  Ankle eversion 12 11   (Blank rows = not tested)  LOWER EXTREMITY MMT:  MMT Right eval Left eval  Hip flexion    Hip extension    Hip abduction 4 4  Hip adduction    Hip internal rotation    Hip external rotation    Knee flexion    Knee extension    Ankle dorsiflexion 5 5  Ankle plantarflexion DL calf raise  DL calf raise pain   Ankle inversion 5 4 pain  Ankle eversion 5 4 pain   (Blank rows = not tested)  LOWER EXTREMITY SPECIAL TESTS:  None   FUNCTIONAL TESTS:  Tandem stance- unable   GAIT: Distance walked: 20 ft  Assistive device utilized: None Level of assistance: Complete Independence Comments: Rt foot ER, excessive frontal plane movement (with shoes donned as it is too painful for patient to walk barefoot)                                                                                                                                 [facility] Adult PT Treatment:                                                DATE: 04/18/24 Therapeutic Exercise: Demonstrated,performed, and  issued initial HEP.      PATIENT EDUCATION:  Education details: see treatment; POC  Person educated: Patient Education method: Explanation, Demonstration, Tactile cues, Verbal cues, and Handouts Education comprehension: verbalized understanding, returned demonstration, verbal cues required, tactile cues required, and needs further education  HOME EXERCISE PROGRAM: Access Code: [email or web address] URL: [email or web address] Date: 04/18/2024 Prepared by: Forrestine Ike  Exercises - Long Sitting Calf Stretch with Strap  - 2 x daily - 7 x weekly - 3 sets - 30 sec  hold - Isometric Ankle Inversion  - 2 x daily - 7 x weekly - 2 sets - 10 reps - 5 sec  hold - Isometric Ankle Eversion at Wall  - 2 x daily - 7 x weekly - 2 sets - 10 reps - 5 sec  hold - Seated Ankle Plantarflexion with Resistance  - 2 x daily - 7 x weekly - 2 sets - 10 reps  ASSESSMENT:  CLINICAL IMPRESSION: Patient is a 78 y.o. female who was seen today for physical therapy evaluation and treatment for metatarsalgia, tendonitis, and plantar fascitis. She reports history of chronic bilateral foot/ankle pain stating that she has undergone 2 peroneal tendon repairs on the RLE and 1 on the LLE. Her biggest complaint currently is Lt lateral ankle pain. Upon assessment she is noted to have significant pes cavus bilaterally, Lt ankle weakness with pain provoked with inversion,eversion,  and plantarflexor MMT, balance impairments, and gait abnormalities. She will benefit from skilled PT to address the above stated deficits in order to optimize her function and assist in overall pain reduction.   OBJECTIVE IMPAIRMENTS: Abnormal gait, decreased activity tolerance, decreased balance, decreased endurance, difficulty walking, decreased ROM, decreased strength, impaired flexibility, improper body mechanics, postural dysfunction, and pain.   ACTIVITY LIMITATIONS: standing, squatting, sleeping, stairs, transfers, bathing, and locomotion  level  PARTICIPATION LIMITATIONS: cleaning, community activity, and yard work  PERSONAL FACTORS: Age, Fitness, Past/current experiences, Time since onset of injury/illness/exacerbation, and 3+ comorbidities: see PMH abvoe are also affecting patient's functional outcome.   REHAB POTENTIAL: Fair chronicity of injury;personal factors   CLINICAL DECISION MAKING: Stable/uncomplicated  EVALUATION COMPLEXITY: Low   GOALS: Goals reviewed with patient? Yes  SHORT TERM GOALS: Target date: 05/16/2024   Patient will be independent and compliant with initial HEP.   Baseline: issued at eval Goal status: INITIAL  2.  Patient will maintain stance on unstable surface for at least 10 seconds to improve stability when on uneven terrain.  Baseline: unable to maintain tandem stance  Goal status: INITIAL  3.  Patient will improve bilateral ankle DF AROM by at least 5 degrees to improve gait mechanics.  Baseline: see above  Goal status: INITIAL   LONG TERM GOALS: Target date: 06/16/24  Patient will score >/= 70 on the Foot and Ankle Disability Index (MDC 7) to signify clinically meaningful improvement in functional abilities.   Baseline: see above Goal status: INITIAL  2.  Patient will demonstrate 5/5 Lt ankle inversion/eversion strength to improve gait stability.  Baseline: see above Goal status: INITIAL  3.  Patient will maintain tandem stance for at least 10 seconds to improve stability when navigating on uneven terrain.  Baseline: unable Goal status: INITIAL  4.  Patient will report pain at worst rated as </= 4/10 to reduce current functional limitations.  Baseline: 8 Goal status: INITIAL    PLAN:  PT FREQUENCY: 1-2x/week  PT DURATION: 8 weeks  PLANNED INTERVENTIONS: [id number]- PT Re-evaluation, 97750- Physical Performance Testing, 97110-Therapeutic exercises, 97530- Therapeutic activity, [id number]- Neuromuscular re-education, 97535- Self Care, 65784- Manual therapy, [id number]- Gait  training, [phone]- Aquatic Therapy, [phone]- Ionotophoresis 4mg /ml Dexamethasone, Balance training, Taping, Dry Needling, Cryotherapy, and Moist heat  PLAN FOR NEXT SESSION: review and progress HEP prn; progress pain free eversion/inversion strengthening; calf stretching; static balance activity. Hip abductor strengthening   Forrestine Ike, PT, DPT, ATC 04/18/24 1:05 PM

## 2024-04-25 ENCOUNTER — Ambulatory Visit: Admitting: Rehabilitative and Restorative Service Providers"

## 2024-04-25 ENCOUNTER — Encounter: Payer: Self-pay | Admitting: Rehabilitative and Restorative Service Providers"

## 2024-04-25 DIAGNOSIS — R293 Abnormal posture: Secondary | ICD-10-CM

## 2024-04-25 DIAGNOSIS — M25572 Pain in left ankle and joints of left foot: Secondary | ICD-10-CM

## 2024-04-25 DIAGNOSIS — M25571 Pain in right ankle and joints of right foot: Secondary | ICD-10-CM

## 2024-04-25 DIAGNOSIS — R2689 Other abnormalities of gait and mobility: Secondary | ICD-10-CM

## 2024-04-25 DIAGNOSIS — M6281 Muscle weakness (generalized): Secondary | ICD-10-CM

## 2024-04-25 NOTE — Therapy (Signed)
 OUTPATIENT PHYSICAL THERAPY LOWER EXTREMITY TREATMENT   Patient Name: Marsia Cino MRN: [id number] DOB:1946/03/30, 78 y.o., female Today's Date: 04/25/2024  END OF SESSION:  PT End of Session - 04/25/24 1147     Visit Number 2    Number of Visits 17    Date for PT Re-Evaluation 06/16/24    Authorization Type [employer]    PT Start Time 1145    PT Stop Time 1230    PT Time Calculation (min) 45 min    Activity Tolerance Patient tolerated treatment well             Past Medical History:  Diagnosis Date   Abnormal liver function test 08/12/2015   Arthritis    GERD (gastroesophageal reflux disease)    H/O measles    H/O mumps    History of chicken pox 05/18/2015   Hyperglycemia 05/12/2015   Hyperlipidemia    Kidney stones    Neck pain 05/12/2015   Overweight 08/12/2015   Pain in joint, shoulder region 05/12/2015   right   Screen for colon cancer 05/18/2015   TMJ (temporomandibular joint syndrome) 05/18/2015   UTI (lower urinary tract infection)    Past Surgical History:  Procedure Laterality Date   APPENDECTOMY  1966   arthrotomy and menisectomy Right 05/01/15   CARPAL TUNNEL RELEASE  09/15/04   CATARACT EXTRACTION Right 03/03/09   CATARACT EXTRACTION Left 03/17/09   ENDOSCOPIC PLANTAR FASCIOTOMY Right 06/21/05   epicondylitis  08/28/97   Left arm, tennis elbow release   EXTRACORPOREAL SHOCK WAVE LITHOTRIPSY Right 02/27/09   hammer toes Right 03/03/09   right foot 2 hammer toes corrected   LITHOTRIPSY Right 04/09/14   Neck fusion  6/14.07   C5, C6, C7   right ulna shortening Right 11/02/07   SEPTOPLASTY  03/11/10   SHOULDER SURGERY Right 12/09/10   TMJ ARTHROPLASTY Left 03/27/13   ULNAR SHORTENING WITH BONE GRAFT  08/28/02   Patient Active Problem List   Diagnosis Date Noted   Pseudophakia of both eyes 12/04/2020   Aphakia of right eye 11/15/2020   Thoracic facet syndrome 07/22/2020   Cavus deformity of foot, acquired 12/23/2019   Spinal stenosis of cervical region 08/16/2019    Sprain of ligaments of thoracic spine 08/16/2019   Intraocular lens dislocation, initial encounter 07/02/2019   Myalgia 05/17/2019   Visual disturbance 05/17/2019   S/P cervical spinal fusion 08/02/2018   Chronic left shoulder pain 05/23/2018   Spondylosis of cervical 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 myelopathy or radiculopathy 05/23/2018   Whiplash injury to neck 05/23/2018   Work related injury 05/23/2018   Hammertoe of right foot 05/09/2018   Alterations of sensations 05/05/2018   Degenerative cervical disc 05/05/2018   Sacroiliitis (HCC) 05/05/2018   Vertigo 05/05/2018   Vitamin D deficiency 05/05/2018   Prediabetes 01/27/2018   Nausea 12/30/2017   New daily persistent headache 12/30/2017   Chronic or recurrent subluxation of carpometacarpal joint of thumb, right 05/10/2017   DDD (degenerative disc disease), lumbar 01/14/2017   Lumbar facet arthropathy 01/14/2017   Spondylosis 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 01/14/2017   Sacroiliac joint pain 10/14/2016   Osteopenia 04/23/2016   Sprain of ankle 04/08/2016   Post-operative state 04/08/2016   Tendon tear 01/20/2016   Spells 10/14/2015   Overweight 08/12/2015   Abnormal liver function test 08/12/2015   History of chicken pox 05/18/2015   Screen for colon cancer 05/18/2015   TMJ (temporomandibular joint syndrome) 05/18/2015   H/O measles    Pain  in joint, shoulder region 05/12/2015   Neck pain 05/12/2015   Hyperglycemia 05/12/2015   Arthritis    Hyperlipidemia    Kidney stones    GERD (gastroesophageal reflux disease)    Tendon tear, ankle, right, subsequent encounter 04/27/2015   Cramping of feet 05/07/2013   Brachial neuritis 10/19/2012   Essential tremor 10/19/2012   Left cervical radiculopathy 10/19/2012   Sprain and strain of shoulder and upper arm 03/02/2012   Sprain of left shoulder girdle 03/02/2012   Thoracic sprain and strain 03/02/2012   Left knee pain 11/03/2011    PCP: Lorenso Romance,  MD  REFERRING PROVIDER: Charity Conch, DPM  REFERRING DIAG:  [phone] (ICD-10-CM) - Metatarsalgia of both feet  M77.9 (ICD-10-CM) - Tendonitis  M72.2 (ICD-10-CM) - Plantar fasciitis    THERAPY DIAG:  Pain in left ankle and joints of left foot  Pain in right ankle and joints of right foot  Muscle weakness (generalized)  Other abnormalities of gait and mobility  Abnormal posture  Rationale for Evaluation and Treatment: Rehabilitation  ONSET DATE: chronic   SUBJECTIVE:   SUBJECTIVE STATEMENT: Patient reports that she has walked 4.34 miles this morning. She has continued pain in the L > R foot and ankle. She did not do the exercises from therapy for the first several days after therapy. She was deep cleaning on her knees at the [place]. She irritated her L hand from thumb and carpal tunnel surgeries. She also irritated her feet and ankles. She has been sleeping in her recliner for ~ the past week and a half. She gets a new mattress tomorrow.   EVAL: Patient reports she has had a lot of changes to the orthotics she has been wearing to find the right fit. She was also found to have a leg length discrepancy with the RLE being shorter. The orthotists was able to add a little heel lift, which has made some difference. She has had 1 tendon repair on the LLE and 2 on the RLE per patient to the peroneal tendon. She is noticing that her foot is caving inward when she stands. At her most recent visit with Dr. Clydia Dart she was told that the tendons have stretched on the Lt and recommended that she start with PT. The pain is mostly localized to the Lt lateral ankle. The Rt ankle/foot is doing ok with metatarsal pad, but it is very painful to be barefoot on the Rt foot.   PERTINENT HISTORY: Per patient 2 peroneal tendon repairs on RLE, 1 on the LLE  Rt plantar fasciotomy  Vertigo  PAIN:  Are you having pain? Yes: NPRS scale: 8 Pain location: left lateral ankle Pain description:  "feels like somebody is pulling something tight"; burning Aggravating factors: certain positions laying in recliner, heavy cleaning activities Relieving factors: rest and support   PRECAUTIONS: None   WEIGHT BEARING RESTRICTIONS: No  FALLS:  Has patient fallen in last 6 months? No  LIVING ENVIRONMENT: Lives with: lives alone Lives in: House/apartment Stairs: Yes: External: 3 steps; on left going up Has following equipment at home: None  OCCUPATION: retired    PATIENT GOALS: "I want to find out why the ankle just continues to hurt and strengthen it."  NEXT MD VISIT: nothing scheduled   OBJECTIVE:  Note: Objective measures were completed at Evaluation unless otherwise noted.  DIAGNOSTIC FINDINGS: no recent imaging on file   PATIENT SURVEYS:  FADI: 63/100    SENSATION: Not tested  EDEMA:  No  obvious swelling about the ankles    POSTURE: pes cavus   LOWER EXTREMITY ROM:  Active ROM Right eval Left eval  Hip flexion    Hip extension    Hip abduction    Hip adduction    Hip internal rotation    Hip external rotation    Knee flexion    Knee extension    Ankle dorsiflexion Lacking 2  Lacking 1   Ankle plantarflexion 72 74  Ankle inversion 16 16 pain  Ankle eversion 12 11   (Blank rows = not tested)  LOWER EXTREMITY MMT:  MMT Right eval Left eval  Hip flexion    Hip extension    Hip abduction 4 4  Hip adduction    Hip internal rotation    Hip external rotation    Knee flexion    Knee extension    Ankle dorsiflexion 5 5  Ankle plantarflexion DL calf raise  DL calf raise pain   Ankle inversion 5 4 pain  Ankle eversion 5 4 pain   (Blank rows = not tested)  LOWER EXTREMITY SPECIAL TESTS:  None   FUNCTIONAL TESTS:  Tandem stance- unable   GAIT: Distance walked: 20 ft  Assistive device utilized: None Level of assistance: Complete Independence Comments: Rt foot ER, excessive frontal plane movement (with shoes donned as it is too painful for  patient to walk barefoot)    [facility] Adult PT Treatment:                                                DATE: 04/25/24 Therapeutic Exercise: Sitting  Seated long sitting calf stretch 30 sec x 3  Plantar flexion green TB 3 sec x 10 x 2 sets Isometric eversion against ball 3 sec x 10  Isometric inversion against ball 3 sec x 10  Feet flat on floor leaning forward with press through the knees x 10  Therapeutic Activity: Sitting Sit to stand x 10 VC to move slowly stand to sit (pt does 30 sit to stand in the morning) Supine  Piriformis stretch 30 sec x 2 Standing  Single leg stance at counter UE support as needed for balance 10 sec x 3 R/L (difficult) Mini squat at counter x 10  Hip extension at counter 3 sec x 10 R/L   Hip abduction at counter leading with heel 3 sec x 10 R/L      PATIENT EDUCATION:  Education details: see treatment; POC  Person educated: Patient Education method: Explanation, Demonstration, Tactile cues, Verbal cues, and Handouts Education comprehension: verbalized understanding, returned demonstration, verbal cues required, tactile cues required, and needs further education  HOME EXERCISE PROGRAM: Access Code: [email or web address] URL: [email or web address] Date: 04/25/2024 Prepared by: Rawan Riendeau  Exercises - Long Sitting Calf Stretch with Strap  - 2 x daily - 7 x weekly - 3 sets - 30 sec  hold - Isometric Ankle Inversion  - 2 x daily - 7 x weekly - 2 sets - 10 reps - 5 sec  hold - Isometric Ankle Eversion at Wall  - 2 x daily - 7 x weekly - 2 sets - 10 reps - 5 sec  hold - Seated Ankle Plantarflexion with Resistance  - 2 x daily - 7 x weekly - 2 sets - 10 reps - Supine Piriformis Stretch with Leg Straight  - 1 x daily -  7 x weekly - 1 sets - 3 reps - 30 sec  hold - Standing Hip Abduction with Counter Support  - 1 x daily - 7 x weekly - 2-3 sets - 10 reps - 2-3 sec  hold - Standing Single Leg Stance with Counter Support  - 1 x daily - 7 x weekly - 1 sets -  3 reps - 10-20 sec  hold  ASSESSMENT:  CLINICAL IMPRESSION: Reviewed and progressed with exercises. Corrected resistive isometric inversion. Added standing balance and strengthening exercises. Recommended that Aiza stop walking when she experiences pain and notices swelling as she walks on the track in the mornings. Shggested she try the recumbent bike as she begins to notice symptoms.   EVAL: Patient is a 78 y.o. female who was seen today for physical therapy evaluation and treatment for metatarsalgia, tendonitis, and plantar fascitis. She reports history of chronic bilateral foot/ankle pain stating that she has undergone 2 peroneal tendon repairs on the RLE and 1 on the LLE. Her biggest complaint currently is Lt lateral ankle pain. Upon assessment she is noted to have significant pes cavus bilaterally, Lt ankle weakness with pain provoked with inversion,eversion, and plantarflexor MMT, balance impairments, and gait abnormalities. She will benefit from skilled PT to address the above stated deficits in order to optimize her function and assist in overall pain reduction.   OBJECTIVE IMPAIRMENTS: Abnormal gait, decreased activity tolerance, decreased balance, decreased endurance, difficulty walking, decreased ROM, decreased strength, impaired flexibility, improper body mechanics, postural dysfunction, and pain.    GOALS: Goals reviewed with patient? Yes  SHORT TERM GOALS: Target date: 05/16/2024   Patient will be independent and compliant with initial HEP.   Baseline: issued at eval Goal status: INITIAL  2.  Patient will maintain stance on unstable surface for at least 10 seconds to improve stability when on uneven terrain.  Baseline: unable to maintain tandem stance  Goal status: INITIAL  3.  Patient will improve bilateral ankle DF AROM by at least 5 degrees to improve gait mechanics.  Baseline: see above  Goal status: INITIAL   LONG TERM GOALS: Target date: 06/16/24  Patient will  score >/= 70 on the Foot and Ankle Disability Index (MDC 7) to signify clinically meaningful improvement in functional abilities.   Baseline: see above Goal status: INITIAL  2.  Patient will demonstrate 5/5 Lt ankle inversion/eversion strength to improve gait stability.  Baseline: see above Goal status: INITIAL  3.  Patient will maintain tandem stance for at least 10 seconds to improve stability when navigating on uneven terrain.  Baseline: unable Goal status: INITIAL  4.  Patient will report pain at worst rated as </= 4/10 to reduce current functional limitations.  Baseline: 8 Goal status: INITIAL    PLAN:  PT FREQUENCY: 1-2x/week  PT DURATION: 8 weeks  PLANNED INTERVENTIONS: 97164- PT Re-evaluation, 97750- Physical Performance Testing, 97110-Therapeutic exercises, 97530- Therapeutic activity, [id number]- Neuromuscular re-education, 97535- Self Care, 16109- Manual therapy, [id number]- Gait training, [phone]- Aquatic Therapy, [phone]- Ionotophoresis 4mg /ml Dexamethasone, Balance training, Taping, Dry Needling, Cryotherapy, and Moist heat  PLAN FOR NEXT SESSION: review and progress HEP prn; progress pain free eversion/inversion strengthening; calf stretching; static balance activity. Hip abductor strengthening   Audryana Hockenberry P. Geraldo Klippel PT, MPH 04/25/24 11:47 AM

## 2024-04-30 ENCOUNTER — Ambulatory Visit: Attending: Podiatry

## 2024-04-30 DIAGNOSIS — M25571 Pain in right ankle and joints of right foot: Secondary | ICD-10-CM | POA: Insufficient documentation

## 2024-04-30 DIAGNOSIS — M6281 Muscle weakness (generalized): Secondary | ICD-10-CM | POA: Diagnosis present

## 2024-04-30 DIAGNOSIS — M25572 Pain in left ankle and joints of left foot: Secondary | ICD-10-CM | POA: Diagnosis present

## 2024-04-30 DIAGNOSIS — R2689 Other abnormalities of gait and mobility: Secondary | ICD-10-CM | POA: Diagnosis present

## 2024-04-30 DIAGNOSIS — R293 Abnormal posture: Secondary | ICD-10-CM | POA: Insufficient documentation

## 2024-04-30 NOTE — Therapy (Signed)
 OUTPATIENT PHYSICAL THERAPY LOWER EXTREMITY TREATMENT   Patient Name: Rebecca Lewis MRN: [id number] DOB:1945-12-07, 78 y.o., female Today's Date: 04/30/2024  END OF SESSION:  PT End of Session - 04/30/24 1401     Visit Number 3    Number of Visits 17    Date for PT Re-Evaluation 06/16/24    Authorization Type [employer]    PT Start Time 1401    PT Stop Time 1443    PT Time Calculation (min) 42 min    Activity Tolerance Patient tolerated treatment well              Past Medical History:  Diagnosis Date   Abnormal liver function test 08/12/2015   Arthritis    GERD (gastroesophageal reflux disease)    H/O measles    H/O mumps    History of chicken pox 05/18/2015   Hyperglycemia 05/12/2015   Hyperlipidemia    Kidney stones    Neck pain 05/12/2015   Overweight 08/12/2015   Pain in joint, shoulder region 05/12/2015   right   Screen for colon cancer 05/18/2015   TMJ (temporomandibular joint syndrome) 05/18/2015   UTI (lower urinary tract infection)    Past Surgical History:  Procedure Laterality Date   APPENDECTOMY  1966   arthrotomy and menisectomy Right 05/01/15   CARPAL TUNNEL RELEASE  09/15/04   CATARACT EXTRACTION Right 03/03/09   CATARACT EXTRACTION Left 03/17/09   ENDOSCOPIC PLANTAR FASCIOTOMY Right 06/21/05   epicondylitis  08/28/97   Left arm, tennis elbow release   EXTRACORPOREAL SHOCK WAVE LITHOTRIPSY Right 02/27/09   hammer toes Right 03/03/09   right foot 2 hammer toes corrected   LITHOTRIPSY Right 04/09/14   Neck fusion  6/14.07   C5, C6, C7   right ulna shortening Right 11/02/07   SEPTOPLASTY  03/11/10   SHOULDER SURGERY Right 12/09/10   TMJ ARTHROPLASTY Left 03/27/13   ULNAR SHORTENING WITH BONE GRAFT  08/28/02   Patient Active Problem List   Diagnosis Date Noted   Pseudophakia of both eyes 12/04/2020   Aphakia of right eye 11/15/2020   Thoracic facet syndrome 07/22/2020   Cavus deformity of foot, acquired 12/23/2019   Spinal stenosis of cervical region 08/16/2019    Sprain of ligaments of thoracic spine 08/16/2019   Intraocular lens dislocation, initial encounter 07/02/2019   Myalgia 05/17/2019   Visual disturbance 05/17/2019   S/P cervical spinal fusion 08/02/2018   Chronic left shoulder pain 05/23/2018   Spondylosis of cervical 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 myelopathy or radiculopathy 05/23/2018   Whiplash injury to neck 05/23/2018   Work related injury 05/23/2018   Hammertoe of right foot 05/09/2018   Alterations of sensations 05/05/2018   Degenerative cervical disc 05/05/2018   Sacroiliitis (HCC) 05/05/2018   Vertigo 05/05/2018   Vitamin D deficiency 05/05/2018   Prediabetes 01/27/2018   Nausea 12/30/2017   New daily persistent headache 12/30/2017   Chronic or recurrent subluxation of carpometacarpal joint of thumb, right 05/10/2017   DDD (degenerative disc disease), lumbar 01/14/2017   Lumbar facet arthropathy 01/14/2017   Spondylosis 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 01/14/2017   Sacroiliac joint pain 10/14/2016   Osteopenia 04/23/2016   Sprain of ankle 04/08/2016   Post-operative state 04/08/2016   Tendon tear 01/20/2016   Spells 10/14/2015   Overweight 08/12/2015   Abnormal liver function test 08/12/2015   History of chicken pox 05/18/2015   Screen for colon cancer 05/18/2015   TMJ (temporomandibular joint syndrome) 05/18/2015   H/O measles  Pain in joint, shoulder region 05/12/2015   Neck pain 05/12/2015   Hyperglycemia 05/12/2015   Arthritis    Hyperlipidemia    Kidney stones    GERD (gastroesophageal reflux disease)    Tendon tear, ankle, right, subsequent encounter 04/27/2015   Cramping of feet 05/07/2013   Brachial neuritis 10/19/2012   Essential tremor 10/19/2012   Left cervical radiculopathy 10/19/2012   Sprain and strain of shoulder and upper arm 03/02/2012   Sprain of left shoulder girdle 03/02/2012   Thoracic sprain and strain 03/02/2012   Left knee pain 11/03/2011    PCP: Lorenso Romance,  MD  REFERRING PROVIDER: Charity Conch, DPM  REFERRING DIAG:  [id number] (ICD-10-CM) - Metatarsalgia of both feet  M77.9 (ICD-10-CM) - Tendonitis  M72.2 (ICD-10-CM) - Plantar fasciitis    THERAPY DIAG:  Pain in left ankle and joints of left foot  Pain in right ankle and joints of right foot  Muscle weakness (generalized)  Other abnormalities of gait and mobility  Abnormal posture  Rationale for Evaluation and Treatment: Rehabilitation  ONSET DATE: chronic   SUBJECTIVE:   SUBJECTIVE STATEMENT: Patient reports she felt a good difference after last session, but then it started to bother her again. The exercises are going pretty good, but some of them hurt.   EVAL: Patient reports she has had a lot of changes to the orthotics she has been wearing to find the right fit. She was also found to have a leg length discrepancy with the RLE being shorter. The orthotists was able to add a little heel lift, which has made some difference. She has had 1 tendon repair on the LLE and 2 on the RLE per patient to the peroneal tendon. She is noticing that her foot is caving inward when she stands. At her most recent visit with Dr. Clydia Dart she was told that the tendons have stretched on the Lt and recommended that she start with PT. The pain is mostly localized to the Lt lateral ankle. The Rt ankle/foot is doing ok with metatarsal pad, but it is very painful to be barefoot on the Rt foot.   PERTINENT HISTORY: Per patient 2 peroneal tendon repairs on RLE, 1 on the LLE  Rt plantar fasciotomy  Vertigo  PAIN:  Are you having pain? Yes: NPRS scale: 5 Pain location: left lateral ankle Pain description: "feels like somebody is pulling something tight"; burning Aggravating factors: certain positions laying in recliner, heavy cleaning activities Relieving factors: rest and support   PRECAUTIONS: None   WEIGHT BEARING RESTRICTIONS: No  FALLS:  Has patient fallen in last 6 months?  No  LIVING ENVIRONMENT: Lives with: lives alone Lives in: House/apartment Stairs: Yes: External: 3 steps; on left going up Has following equipment at home: None  OCCUPATION: retired    PATIENT GOALS: "I want to find out why the ankle just continues to hurt and strengthen it."  NEXT MD VISIT: nothing scheduled   OBJECTIVE:  Note: Objective measures were completed at Evaluation unless otherwise noted.  DIAGNOSTIC FINDINGS: no recent imaging on file   PATIENT SURVEYS:  FADI: 63/100    SENSATION: Not tested  EDEMA:  No obvious swelling about the ankles    POSTURE: pes cavus   LOWER EXTREMITY ROM:  Active ROM Right eval Left eval  Hip flexion    Hip extension    Hip abduction    Hip adduction    Hip internal rotation    Hip external rotation    Knee  flexion    Knee extension    Ankle dorsiflexion Lacking 2  Lacking 1   Ankle plantarflexion 72 74  Ankle inversion 16 16 pain  Ankle eversion 12 11   (Blank rows = not tested)  LOWER EXTREMITY MMT:  MMT Right eval Left eval  Hip flexion    Hip extension    Hip abduction 4 4  Hip adduction    Hip internal rotation    Hip external rotation    Knee flexion    Knee extension    Ankle dorsiflexion 5 5  Ankle plantarflexion DL calf raise  DL calf raise pain   Ankle inversion 5 4 pain  Ankle eversion 5 4 pain   (Blank rows = not tested)  LOWER EXTREMITY SPECIAL TESTS:  None   FUNCTIONAL TESTS:  Tandem stance- unable   GAIT: Distance walked: 20 ft  Assistive device utilized: None Level of assistance: Complete Independence Comments: Rt foot ER, excessive frontal plane movement (with shoes donned as it is too painful for patient to walk barefoot)   [facility] Adult PT Treatment:                                                DATE: 04/30/24 Therapeutic Exercise: Seated calf raise 2 x 10 on step with 5 lb kettlebell  Seated ankle rockerboard 2 x 10; a/p  Manual Therapy: IASTM Lt peroneals,  gastroc,soleus Neuromuscular re-ed: Ankle inversion/eversion isometric with ball x 10 each  Resisted ankle plantarflexion green band 2 x 10  Resisted ankle dorsiflexion green band 2 x 10    [facility] Adult PT Treatment:                                                DATE: 04/25/24 Therapeutic Exercise: Sitting  Seated long sitting calf stretch 30 sec x 3  Plantar flexion green TB 3 sec x 10 x 2 sets Isometric eversion against ball 3 sec x 10  Isometric inversion against ball 3 sec x 10  Feet flat on floor leaning forward with press through the knees x 10  Therapeutic Activity: Sitting Sit to stand x 10 VC to move slowly stand to sit (pt does 30 sit to stand in the morning) Supine  Piriformis stretch 30 sec x 2 Standing  Single leg stance at counter UE support as needed for balance 10 sec x 3 R/L (difficult) Mini squat at counter x 10  Hip extension at counter 3 sec x 10 R/L   Hip abduction at counter leading with heel 3 sec x 10 R/L      PATIENT EDUCATION:  Education details: HEP review Person educated: Patient Education method: Explanation Education comprehension: verbalized understanding  HOME EXERCISE PROGRAM: Access Code: [email or web address] URL: [email or web address] Date: 04/25/2024 Prepared by: Celyn Holt  Exercises - Long Sitting Calf Stretch with Strap  - 2 x daily - 7 x weekly - 3 sets - 30 sec  hold - Isometric Ankle Inversion  - 2 x daily - 7 x weekly - 2 sets - 10 reps - 5 sec  hold - Isometric Ankle Eversion at Wall  - 2 x daily - 7 x weekly - 2 sets - 10 reps - 5 sec  hold - Seated Ankle Plantarflexion with Resistance  - 2 x daily - 7 x weekly - 2 sets - 10 reps - Supine Piriformis Stretch with Leg Straight  - 1 x daily - 7 x weekly - 1 sets - 3 reps - 30 sec  hold - Standing Hip Abduction with Counter Support  - 1 x daily - 7 x weekly - 2-3 sets - 10 reps - 2-3 sec  hold - Standing Single Leg Stance with Counter Support  - 1 x daily - 7 x weekly - 1 sets  - 3 reps - 10-20 sec  hold  ASSESSMENT:  CLINICAL IMPRESSION: Patient tolerated session well today focusing on ankle strengthening. Has mild pain about peroneal tendon insertion at 5th metatarsal with isometric inversion/eversion. With resisted plantarflexion has difficulty maintaining foot in neutral as foot has tendency to invert. No increase in pain noted at conclusion of session.   EVAL: Patient is a 78 y.o. female who was seen today for physical therapy evaluation and treatment for metatarsalgia, tendonitis, and plantar fascitis. She reports history of chronic bilateral foot/ankle pain stating that she has undergone 2 peroneal tendon repairs on the RLE and 1 on the LLE. Her biggest complaint currently is Lt lateral ankle pain. Upon assessment she is noted to have significant pes cavus bilaterally, Lt ankle weakness with pain provoked with inversion,eversion, and plantarflexor MMT, balance impairments, and gait abnormalities. She will benefit from skilled PT to address the above stated deficits in order to optimize her function and assist in overall pain reduction.   OBJECTIVE IMPAIRMENTS: Abnormal gait, decreased activity tolerance, decreased balance, decreased endurance, difficulty walking, decreased ROM, decreased strength, impaired flexibility, improper body mechanics, postural dysfunction, and pain.    GOALS: Goals reviewed with patient? Yes  SHORT TERM GOALS: Target date: 05/16/2024   Patient will be independent and compliant with initial HEP.   Baseline: issued at eval Goal status: INITIAL  2.  Patient will maintain stance on unstable surface for at least 10 seconds to improve stability when on uneven terrain.  Baseline: unable to maintain tandem stance  Goal status: INITIAL  3.  Patient will improve bilateral ankle DF AROM by at least 5 degrees to improve gait mechanics.  Baseline: see above  Goal status: INITIAL   LONG TERM GOALS: Target date: 06/16/24  Patient will score  >/= 70 on the Foot and Ankle Disability Index (MDC 7) to signify clinically meaningful improvement in functional abilities.   Baseline: see above Goal status: INITIAL  2.  Patient will demonstrate 5/5 Lt ankle inversion/eversion strength to improve gait stability.  Baseline: see above Goal status: INITIAL  3.  Patient will maintain tandem stance for at least 10 seconds to improve stability when navigating on uneven terrain.  Baseline: unable Goal status: INITIAL  4.  Patient will report pain at worst rated as </= 4/10 to reduce current functional limitations.  Baseline: 8 Goal status: INITIAL    PLAN:  PT FREQUENCY: 1-2x/week  PT DURATION: 8 weeks  PLANNED INTERVENTIONS: [id number]- PT Re-evaluation, 97750- Physical Performance Testing, 97110-Therapeutic exercises, 97530- Therapeutic activity, [id number]- Neuromuscular re-education, 97535- Self Care, [id number]- Manual therapy, [id number]- Gait training, [phone]- Aquatic Therapy, [phone]- Ionotophoresis 4mg /ml Dexamethasone, Balance training, Taping, Dry Needling, Cryotherapy, and Moist heat  PLAN FOR NEXT SESSION: review and progress HEP prn; progress pain free eversion/inversion strengthening; calf stretching; static balance activity. Hip abductor strengthening   Haskel Dewalt, PT, DPT, ATC 04/30/24 2:46 PM

## 2024-05-02 ENCOUNTER — Ambulatory Visit: Payer: Self-pay

## 2024-05-02 DIAGNOSIS — R2689 Other abnormalities of gait and mobility: Secondary | ICD-10-CM

## 2024-05-02 DIAGNOSIS — R293 Abnormal posture: Secondary | ICD-10-CM

## 2024-05-02 DIAGNOSIS — M25571 Pain in right ankle and joints of right foot: Secondary | ICD-10-CM

## 2024-05-02 DIAGNOSIS — M25572 Pain in left ankle and joints of left foot: Secondary | ICD-10-CM | POA: Diagnosis not present

## 2024-05-02 DIAGNOSIS — M6281 Muscle weakness (generalized): Secondary | ICD-10-CM

## 2024-05-02 NOTE — Therapy (Signed)
 OUTPATIENT PHYSICAL THERAPY LOWER EXTREMITY TREATMENT   Patient Name: Rebecca Lewis MRN: [id number] DOB:Mar 30, 1946, 78 y.o., female Today's Date: 05/02/2024  END OF SESSION:  PT End of Session - 05/02/24 1533     Visit Number 4    Number of Visits 17    Date for PT Re-Evaluation 06/16/24    Authorization Type [employer]    PT Start Time 1533    PT Stop Time 1613    PT Time Calculation (min) 40 min    Activity Tolerance Patient tolerated treatment well              Past Medical History:  Diagnosis Date   Abnormal liver function test 08/12/2015   Arthritis    GERD (gastroesophageal reflux disease)    H/O measles    H/O mumps    History of chicken pox 05/18/2015   Hyperglycemia 05/12/2015   Hyperlipidemia    Kidney stones    Neck pain 05/12/2015   Overweight 08/12/2015   Pain in joint, shoulder region 05/12/2015   right   Screen for colon cancer 05/18/2015   TMJ (temporomandibular joint syndrome) 05/18/2015   UTI (lower urinary tract infection)    Past Surgical History:  Procedure Laterality Date   APPENDECTOMY  1966   arthrotomy and menisectomy Right 05/01/15   CARPAL TUNNEL RELEASE  09/15/04   CATARACT EXTRACTION Right 03/03/09   CATARACT EXTRACTION Left 03/17/09   ENDOSCOPIC PLANTAR FASCIOTOMY Right 06/21/05   epicondylitis  08/28/97   Left arm, tennis elbow release   EXTRACORPOREAL SHOCK WAVE LITHOTRIPSY Right 02/27/09   hammer toes Right 03/03/09   right foot 2 hammer toes corrected   LITHOTRIPSY Right 04/09/14   Neck fusion  6/14.07   C5, C6, C7   right ulna shortening Right 11/02/07   SEPTOPLASTY  03/11/10   SHOULDER SURGERY Right 12/09/10   TMJ ARTHROPLASTY Left 03/27/13   ULNAR SHORTENING WITH BONE GRAFT  08/28/02   Patient Active Problem List   Diagnosis Date Noted   Pseudophakia of both eyes 12/04/2020   Aphakia of right eye 11/15/2020   Thoracic facet syndrome 07/22/2020   Cavus deformity of foot, acquired 12/23/2019   Spinal stenosis of cervical region 08/16/2019    Sprain of ligaments of thoracic spine 08/16/2019   Intraocular lens dislocation, initial encounter 07/02/2019   Myalgia 05/17/2019   Visual disturbance 05/17/2019   S/P cervical spinal fusion 08/02/2018   Chronic left shoulder pain 05/23/2018   Spondylosis of cervical 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 myelopathy or radiculopathy 05/23/2018   Whiplash injury to neck 05/23/2018   Work related injury 05/23/2018   Hammertoe of right foot 05/09/2018   Alterations of sensations 05/05/2018   Degenerative cervical disc 05/05/2018   Sacroiliitis (HCC) 05/05/2018   Vertigo 05/05/2018   Vitamin D deficiency 05/05/2018   Prediabetes 01/27/2018   Nausea 12/30/2017   New daily persistent headache 12/30/2017   Chronic or recurrent subluxation of carpometacarpal joint of thumb, right 05/10/2017   DDD (degenerative disc disease), lumbar 01/14/2017   Lumbar facet arthropathy 01/14/2017   Spondylosis 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 01/14/2017   Sacroiliac joint pain 10/14/2016   Osteopenia 04/23/2016   Sprain of ankle 04/08/2016   Post-operative state 04/08/2016   Tendon tear 01/20/2016   Spells 10/14/2015   Overweight 08/12/2015   Abnormal liver function test 08/12/2015   History of chicken pox 05/18/2015   Screen for colon cancer 05/18/2015   TMJ (temporomandibular joint syndrome) 05/18/2015   H/O measles  Pain in joint, shoulder region 05/12/2015   Neck pain 05/12/2015   Hyperglycemia 05/12/2015   Arthritis    Hyperlipidemia    Kidney stones    GERD (gastroesophageal reflux disease)    Tendon tear, ankle, right, subsequent encounter 04/27/2015   Cramping of feet 05/07/2013   Brachial neuritis 10/19/2012   Essential tremor 10/19/2012   Left cervical radiculopathy 10/19/2012   Sprain and strain of shoulder and upper arm 03/02/2012   Sprain of left shoulder girdle 03/02/2012   Thoracic sprain and strain 03/02/2012   Left knee pain 11/03/2011    PCP: Lorenso Romance,  MD  REFERRING PROVIDER: Charity Conch, DPM  REFERRING DIAG:  [phone] (ICD-10-CM) - Metatarsalgia of both feet  M77.9 (ICD-10-CM) - Tendonitis  M72.2 (ICD-10-CM) - Plantar fasciitis    THERAPY DIAG:  Pain in left ankle and joints of left foot  Pain in right ankle and joints of right foot  Muscle weakness (generalized)  Other abnormalities of gait and mobility  Abnormal posture  Rationale for Evaluation and Treatment: Rehabilitation  ONSET DATE: chronic   SUBJECTIVE:   SUBJECTIVE STATEMENT: She did 4 hours of heavy cleaning yesterday and ran errands, which tired her out. She has had a busy morning. The Lt foot/ankle is more tired, but the Rt ankle is hurting too.   EVAL: Patient reports she has had a lot of changes to the orthotics she has been wearing to find the right fit. She was also found to have a leg length discrepancy with the RLE being shorter. The orthotists was able to add a little heel lift, which has made some difference. She has had 1 tendon repair on the LLE and 2 on the RLE per patient to the peroneal tendon. She is noticing that her foot is caving inward when she stands. At her most recent visit with Dr. Clydia Dart she was told that the tendons have stretched on the Lt and recommended that she start with PT. The pain is mostly localized to the Lt lateral ankle. The Rt ankle/foot is doing ok with metatarsal pad, but it is very painful to be barefoot on the Rt foot.   PERTINENT HISTORY: Per patient 2 peroneal tendon repairs on RLE, 1 on the LLE  Rt plantar fasciotomy  Vertigo  PAIN:  Are you having pain? Yes: NPRS scale: 5 (Lt); 4 (Rt)  Pain location: left lateral ankle; right ankle  Pain description: "feels like somebody is pulling something tight"; burning Aggravating factors: certain positions laying in recliner, heavy cleaning activities Relieving factors: rest and support   PRECAUTIONS: None   WEIGHT BEARING RESTRICTIONS: No  FALLS:  Has  patient fallen in last 6 months? No  LIVING ENVIRONMENT: Lives with: lives alone Lives in: House/apartment Stairs: Yes: External: 3 steps; on left going up Has following equipment at home: None  OCCUPATION: retired    PATIENT GOALS: "I want to find out why the ankle just continues to hurt and strengthen it."  NEXT MD VISIT: nothing scheduled   OBJECTIVE:  Note: Objective measures were completed at Evaluation unless otherwise noted.  DIAGNOSTIC FINDINGS: no recent imaging on file   PATIENT SURVEYS:  FADI: 63/100    SENSATION: Not tested  EDEMA:  No obvious swelling about the ankles    POSTURE: pes cavus   LOWER EXTREMITY ROM:  Active ROM Right eval Left eval  Hip flexion    Hip extension    Hip abduction    Hip adduction    Hip  internal rotation    Hip external rotation    Knee flexion    Knee extension    Ankle dorsiflexion Lacking 2  Lacking 1   Ankle plantarflexion 72 74  Ankle inversion 16 16 pain  Ankle eversion 12 11   (Blank rows = not tested)  LOWER EXTREMITY MMT:  MMT Right eval Left eval  Hip flexion    Hip extension    Hip abduction 4 4  Hip adduction    Hip internal rotation    Hip external rotation    Knee flexion    Knee extension    Ankle dorsiflexion 5 5  Ankle plantarflexion DL calf raise  DL calf raise pain   Ankle inversion 5 4 pain  Ankle eversion 5 4 pain   (Blank rows = not tested)  LOWER EXTREMITY SPECIAL TESTS:  None   FUNCTIONAL TESTS:  Tandem stance- unable   GAIT: Distance walked: 20 ft  Assistive device utilized: None Level of assistance: Complete Independence Comments: Rt foot ER, excessive frontal plane movement (with shoes donned as it is too painful for patient to walk barefoot)  [facility] Adult PT Treatment:                                                DATE: 05/02/24 Therapeutic Exercise: Calf stretch on wedge x 1 minute Standing calf raise 2 x 10  Seated ankle inversion AROM 2 x 10   Neuromuscular  re-ed: Sidelying ankle eversion 2 x 10  Sidelying hip abduction 2 x 10  Great toe extension x 10   Self Care: Discussed appropriate gym equipment to utilize at her church gym Recommended to utilize recumbent bike instead of prolonged walking activity to reduce stress on ankles Potential benefits of aquatic PT  [facility] Adult PT Treatment:                                                DATE: 04/30/24 Therapeutic Exercise: Seated calf raise 2 x 10 on step with 5 lb kettlebell  Seated ankle rockerboard 2 x 10; a/p  Manual Therapy: IASTM Lt peroneals, gastroc,soleus Neuromuscular re-ed: Ankle inversion/eversion isometric with ball x 10 each  Resisted ankle plantarflexion green band 2 x 10  Resisted ankle dorsiflexion green band 2 x 10    [facility] Adult PT Treatment:                                                DATE: 04/25/24 Therapeutic Exercise: Sitting  Seated long sitting calf stretch 30 sec x 3  Plantar flexion green TB 3 sec x 10 x 2 sets Isometric eversion against ball 3 sec x 10  Isometric inversion against ball 3 sec x 10  Feet flat on floor leaning forward with press through the knees x 10  Therapeutic Activity: Sitting Sit to stand x 10 VC to move slowly stand to sit (pt does 30 sit to stand in the morning) Supine  Piriformis stretch 30 sec x 2 Standing  Single leg stance at counter UE support as needed for balance 10 sec x  3 R/L (difficult) Mini squat at counter x 10  Hip extension at counter 3 sec x 10 R/L   Hip abduction at counter leading with heel 3 sec x 10 R/L      PATIENT EDUCATION:  Education details: HEP review Person educated: Patient Education method: Explanation Education comprehension: verbalized understanding  HOME EXERCISE PROGRAM: Access Code: [email or web address] URL: [email or web address] Date: 05/02/2024 Prepared by: Forrestine Ike  Exercises - Long Sitting Calf Stretch with Strap  - 2 x daily - 7 x weekly - 3 sets - 30 sec  hold -  Isometric Ankle Inversion  - 2 x daily - 7 x weekly - 2 sets - 10 reps - 5 sec  hold - Isometric Ankle Eversion at Wall  - 2 x daily - 7 x weekly - 2 sets - 10 reps - 5 sec  hold - Seated Ankle Plantarflexion with Resistance  - 2 x daily - 7 x weekly - 2 sets - 10 reps - Supine Piriformis Stretch with Leg Straight  - 1 x daily - 7 x weekly - 1 sets - 3 reps - 30 sec  hold - Standing Hip Abduction with Counter Support  - 1 x daily - 7 x weekly - 2-3 sets - 10 reps - 2-3 sec  hold - Standing Single Leg Stance with Counter Support  - 1 x daily - 7 x weekly - 1 sets - 3 reps - 10-20 sec  hold - Standing Heel Raise  - 1 x daily - 7 x weekly - 2 sets - 10 reps - Sidelying Hip Abduction  - 1 x daily - 7 x weekly - 2 sets - 10 reps - Seated Great Toe Extension  - 1 x daily - 7 x weekly - 2 sets - 10 reps  ASSESSMENT:  CLINICAL IMPRESSION: Patient tolerated session well today focusing on continued ankle strengthening. We discussed utilizing recumbent bike at [employer] as opposed to prolonged walking to reduce stress on her ankle with patient verbalizing understanding. Mild ache with sidelying ankle eversion, but otherwise no complaints of increased pain. She is unable to isolate great toe extension from other digits. Improvement in ankle pain noted at conclusion of session.   EVAL: Patient is a 78 y.o. female who was seen today for physical therapy evaluation and treatment for metatarsalgia, tendonitis, and plantar fascitis. She reports history of chronic bilateral foot/ankle pain stating that she has undergone 2 peroneal tendon repairs on the RLE and 1 on the LLE. Her biggest complaint currently is Lt lateral ankle pain. Upon assessment she is noted to have significant pes cavus bilaterally, Lt ankle weakness with pain provoked with inversion,eversion, and plantarflexor MMT, balance impairments, and gait abnormalities. She will benefit from skilled PT to address the above stated deficits in order to optimize  her function and assist in overall pain reduction.   OBJECTIVE IMPAIRMENTS: Abnormal gait, decreased activity tolerance, decreased balance, decreased endurance, difficulty walking, decreased ROM, decreased strength, impaired flexibility, improper body mechanics, postural dysfunction, and pain.    GOALS: Goals reviewed with patient? Yes  SHORT TERM GOALS: Target date: 05/16/2024   Patient will be independent and compliant with initial HEP.   Baseline: issued at eval Goal status: INITIAL  2.  Patient will maintain stance on unstable surface for at least 10 seconds to improve stability when on uneven terrain.  Baseline: unable to maintain tandem stance  Goal status: INITIAL  3.  Patient will improve bilateral ankle DF AROM by at least  5 degrees to improve gait mechanics.  Baseline: see above  Goal status: INITIAL   LONG TERM GOALS: Target date: 06/16/24  Patient will score >/= 70 on the Foot and Ankle Disability Index (MDC 7) to signify clinically meaningful improvement in functional abilities.   Baseline: see above Goal status: INITIAL  2.  Patient will demonstrate 5/5 Lt ankle inversion/eversion strength to improve gait stability.  Baseline: see above Goal status: INITIAL  3.  Patient will maintain tandem stance for at least 10 seconds to improve stability when navigating on uneven terrain.  Baseline: unable Goal status: INITIAL  4.  Patient will report pain at worst rated as </= 4/10 to reduce current functional limitations.  Baseline: 8 Goal status: INITIAL    PLAN:  PT FREQUENCY: 1-2x/week  PT DURATION: 8 weeks  PLANNED INTERVENTIONS: 97164- PT Re-evaluation, 97750- Physical Performance Testing, 97110-Therapeutic exercises, 97530- Therapeutic activity, [id number]- Neuromuscular re-education, 97535- Self Care, 16109- Manual therapy, [id number]- Gait training, [phone]- Aquatic Therapy, [phone]- Ionotophoresis 4mg /ml Dexamethasone, Balance training, Taping, Dry Needling,  Cryotherapy, and Moist heat  PLAN FOR NEXT SESSION: review and progress HEP prn; progress pain free eversion/inversion strengthening; calf stretching; static balance activity. Hip abductor strengthening   Contina Strain, PT, DPT, ATC 05/02/24 4:14 PM

## 2024-05-07 ENCOUNTER — Ambulatory Visit: Payer: Self-pay

## 2024-05-07 DIAGNOSIS — M6281 Muscle weakness (generalized): Secondary | ICD-10-CM

## 2024-05-07 DIAGNOSIS — M25571 Pain in right ankle and joints of right foot: Secondary | ICD-10-CM

## 2024-05-07 DIAGNOSIS — R2689 Other abnormalities of gait and mobility: Secondary | ICD-10-CM

## 2024-05-07 DIAGNOSIS — R293 Abnormal posture: Secondary | ICD-10-CM

## 2024-05-07 DIAGNOSIS — M25572 Pain in left ankle and joints of left foot: Secondary | ICD-10-CM

## 2024-05-07 NOTE — Therapy (Signed)
 OUTPATIENT PHYSICAL THERAPY LOWER EXTREMITY TREATMENT   Patient Name: Rebecca Lewis MRN: [id number] DOB:[birth year], 78 y.o., female Today's Date: 05/07/2024  END OF SESSION:  PT End of Session - 05/07/24 1314     Visit Number 5    Number of Visits 17    Date for PT Re-Evaluation 06/16/24    Authorization Type [employer]    PT Start Time 1315    PT Stop Time 1400    PT Time Calculation (min) 45 min    Activity Tolerance Patient tolerated treatment well               Past Medical History:  Diagnosis Date   Abnormal liver function test 08/12/2015   Arthritis    GERD (gastroesophageal reflux disease)    H/O measles    H/O mumps    History of chicken pox 05/18/2015   Hyperglycemia 05/12/2015   Hyperlipidemia    Kidney stones    Neck pain 05/12/2015   Overweight 08/12/2015   Pain in joint, shoulder region 05/12/2015   right   Screen for colon cancer 05/18/2015   TMJ (temporomandibular joint syndrome) 05/18/2015   UTI (lower urinary tract infection)    Past Surgical History:  Procedure Laterality Date   APPENDECTOMY  1966   arthrotomy and menisectomy Right 05/01/15   CARPAL TUNNEL RELEASE  09/15/04   CATARACT EXTRACTION Right 03/03/09   CATARACT EXTRACTION Left 03/17/09   ENDOSCOPIC PLANTAR FASCIOTOMY Right 06/21/05   epicondylitis  08/28/97   Left arm, tennis elbow release   EXTRACORPOREAL SHOCK WAVE LITHOTRIPSY Right 02/27/09   hammer toes Right 03/03/09   right foot 2 hammer toes corrected   LITHOTRIPSY Right 04/09/14   Neck fusion  6/14.07   C5, C6, C7   right ulna shortening Right 11/02/07   SEPTOPLASTY  03/11/10   SHOULDER SURGERY Right 12/09/10   TMJ ARTHROPLASTY Left 03/27/13   ULNAR SHORTENING WITH BONE GRAFT  08/28/02   Patient Active Problem List   Diagnosis Date Noted   Pseudophakia of both eyes 12/04/2020   Aphakia of right eye 11/15/2020   Thoracic facet syndrome 07/22/2020   Cavus deformity of foot, acquired 12/23/2019   Spinal stenosis of cervical region 08/16/2019    Sprain of ligaments of thoracic spine 08/16/2019   Intraocular lens dislocation, initial encounter 07/02/2019   Myalgia 05/17/2019   Visual disturbance 05/17/2019   S/P cervical spinal fusion 08/02/2018   Chronic left shoulder pain 05/23/2018   Spondylosis of cervical 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 myelopathy or radiculopathy 05/23/2018   Whiplash injury to neck 05/23/2018   Work related injury 05/23/2018   Hammertoe of right foot 05/09/2018   Alterations of sensations 05/05/2018   Degenerative cervical disc 05/05/2018   Sacroiliitis (HCC) 05/05/2018   Vertigo 05/05/2018   Vitamin D deficiency 05/05/2018   Prediabetes 01/27/2018   Nausea 12/30/2017   New daily persistent headache 12/30/2017   Chronic or recurrent subluxation of carpometacarpal joint of thumb, right 05/10/2017   DDD (degenerative disc disease), lumbar 01/14/2017   Lumbar facet arthropathy 01/14/2017   Spondylosis 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 01/14/2017   Sacroiliac joint pain 10/14/2016   Osteopenia 04/23/2016   Sprain of ankle 04/08/2016   Post-operative state 04/08/2016   Tendon tear 01/20/2016   Spells 10/14/2015   Overweight 08/12/2015   Abnormal liver function test 08/12/2015   History of chicken pox 05/18/2015   Screen for colon cancer 05/18/2015   TMJ (temporomandibular joint syndrome) 05/18/2015   H/O measles  Pain in joint, shoulder region 05/12/2015   Neck pain 05/12/2015   Hyperglycemia 05/12/2015   Arthritis    Hyperlipidemia    Kidney stones    GERD (gastroesophageal reflux disease)    Tendon tear, ankle, right, subsequent encounter 04/27/2015   Cramping of feet 05/07/2013   Brachial neuritis 10/19/2012   Essential tremor 10/19/2012   Left cervical radiculopathy 10/19/2012   Sprain and strain of shoulder and upper arm 03/02/2012   Sprain of left shoulder girdle 03/02/2012   Thoracic sprain and strain 03/02/2012   Left knee pain 11/03/2011    PCP: Lorenso Romance,  MD  REFERRING PROVIDER: Charity Conch, DPM  REFERRING DIAG:  [phone] (ICD-10-CM) - Metatarsalgia of both feet  M77.9 (ICD-10-CM) - Tendonitis  M72.2 (ICD-10-CM) - Plantar fasciitis    THERAPY DIAG:  Pain in left ankle and joints of left foot  Pain in right ankle and joints of right foot  Muscle weakness (generalized)  Other abnormalities of gait and mobility  Abnormal posture  Rationale for Evaluation and Treatment: Rehabilitation  ONSET DATE: chronic   SUBJECTIVE:   SUBJECTIVE STATEMENT: Patient walked 2 miles and biked 2 miles on Thursday and she felt good with this. On Friday she walked a little over 3 miles and then cleaned her house, which caused her ankle to hurt a lot. She is still sleeping in the recliner as she is waiting to get her new mattress situated. She walked 2 miles this morning and did some of her exercises. Patient reports her foot felt great after her exercises, but is hurting now.   EVAL: Patient reports she has had a lot of changes to the orthotics she has been wearing to find the right fit. She was also found to have a leg length discrepancy with the RLE being shorter. The orthotists was able to add a little heel lift, which has made some difference. She has had 1 tendon repair on the LLE and 2 on the RLE per patient to the peroneal tendon. She is noticing that her foot is caving inward when she stands. At her most recent visit with Dr. Clydia Dart she was told that the tendons have stretched on the Lt and recommended that she start with PT. The pain is mostly localized to the Lt lateral ankle. The Rt ankle/foot is doing ok with metatarsal pad, but it is very painful to be barefoot on the Rt foot.   PERTINENT HISTORY: Per patient 2 peroneal tendon repairs on RLE, 1 on the LLE  Rt plantar fasciotomy  Vertigo  PAIN:  Are you having pain? Yes: NPRS scale: 6 Pain location: left lateral ankle Pain description: "feels like somebody is pulling something  tight"; burning Aggravating factors: certain positions laying in recliner, heavy cleaning activities Relieving factors: rest and support   PRECAUTIONS: None   WEIGHT BEARING RESTRICTIONS: No  FALLS:  Has patient fallen in last 6 months? No  LIVING ENVIRONMENT: Lives with: lives alone Lives in: House/apartment Stairs: Yes: External: 3 steps; on left going up Has following equipment at home: None  OCCUPATION: retired    PATIENT GOALS: "I want to find out why the ankle just continues to hurt and strengthen it."  NEXT MD VISIT: nothing scheduled   OBJECTIVE:  Note: Objective measures were completed at Evaluation unless otherwise noted.  DIAGNOSTIC FINDINGS: no recent imaging on file   PATIENT SURVEYS:  FADI: 63/100    SENSATION: Not tested  EDEMA:  No obvious swelling about the ankles  POSTURE: pes cavus   LOWER EXTREMITY ROM:  Active ROM Right eval Left eval  Hip flexion    Hip extension    Hip abduction    Hip adduction    Hip internal rotation    Hip external rotation    Knee flexion    Knee extension    Ankle dorsiflexion Lacking 2  Lacking 1   Ankle plantarflexion 72 74  Ankle inversion 16 16 pain  Ankle eversion 12 11   (Blank rows = not tested)  LOWER EXTREMITY MMT:  MMT Right eval Left eval  Hip flexion    Hip extension    Hip abduction 4 4  Hip adduction    Hip internal rotation    Hip external rotation    Knee flexion    Knee extension    Ankle dorsiflexion 5 5  Ankle plantarflexion DL calf raise  DL calf raise pain   Ankle inversion 5 4 pain  Ankle eversion 5 4 pain   (Blank rows = not tested)  LOWER EXTREMITY SPECIAL TESTS:  None   FUNCTIONAL TESTS:  Tandem stance- unable   GAIT: Distance walked: 20 ft  Assistive device utilized: None Level of assistance: Complete Independence Comments: Rt foot ER, excessive frontal plane movement (with shoes donned as it is too painful for patient to walk barefoot)  [facility] Adult PT  Treatment:                                                DATE: 05/07/24 Therapeutic Exercise: Calf stretch on wedge x 1 minute  Standing calf raise 2 x 10 HEP review   Neuromuscular re-ed: Standing on airex normal stance x 30 sec  Romberg on airex 3 x 30 sec  Standing on airex normal stance eyes closed 2 x 30 sec  Calf raise on airex 2 x 10   Self Care: Assisted patient in use of smart watch for logging walking activity and using timer to set for rest breaks Energy conservation techniques to allow for active rest    [facility] Adult PT Treatment:                                                DATE: 05/02/24 Therapeutic Exercise: Calf stretch on wedge x 1 minute Standing calf raise 2 x 10  Seated ankle inversion AROM 2 x 10   Neuromuscular re-ed: Sidelying ankle eversion 2 x 10  Sidelying hip abduction 2 x 10  Great toe extension x 10   Self Care: Discussed appropriate gym equipment to utilize at her church gym Recommended to utilize recumbent bike instead of prolonged walking activity to reduce stress on ankles Potential benefits of aquatic PT  [facility] Adult PT Treatment:                                                DATE: 04/30/24 Therapeutic Exercise: Seated calf raise 2 x 10 on step with 5 lb kettlebell  Seated ankle rockerboard 2 x 10; a/p  Manual Therapy: IASTM Lt peroneals, gastroc,soleus Neuromuscular re-ed: Ankle inversion/eversion isometric with ball  x 10 each  Resisted ankle plantarflexion green band 2 x 10  Resisted ankle dorsiflexion green band 2 x 10    [facility] Adult PT Treatment:                                                DATE: 04/25/24 Therapeutic Exercise: Sitting  Seated long sitting calf stretch 30 sec x 3  Plantar flexion green TB 3 sec x 10 x 2 sets Isometric eversion against ball 3 sec x 10  Isometric inversion against ball 3 sec x 10  Feet flat on floor leaning forward with press through the knees x 10  Therapeutic Activity: Sitting Sit to stand x 10  VC to move slowly stand to sit (pt does 30 sit to stand in the morning) Supine  Piriformis stretch 30 sec x 2 Standing  Single leg stance at counter UE support as needed for balance 10 sec x 3 R/L (difficult) Mini squat at counter x 10  Hip extension at counter 3 sec x 10 R/L   Hip abduction at counter leading with heel 3 sec x 10 R/L      PATIENT EDUCATION:  Education details: HEP review Person educated: Patient Education method: Explanation Education comprehension: verbalized understanding  HOME EXERCISE PROGRAM: Access Code: [email or web address] URL: [email or web address] Date: 05/07/2024 Prepared by: Forrestine Ike  Exercises - Long Sitting Calf Stretch with Strap  - 2 x daily - 7 x weekly - 3 sets - 30 sec  hold - Isometric Ankle Inversion  - 2 x daily - 7 x weekly - 2 sets - 10 reps - 5 sec  hold - Isometric Ankle Eversion at Wall  - 2 x daily - 7 x weekly - 2 sets - 10 reps - 5 sec  hold - Seated Ankle Plantarflexion with Resistance  - 2 x daily - 7 x weekly - 2 sets - 10 reps - Supine Piriformis Stretch with Leg Straight  - 1 x daily - 7 x weekly - 1 sets - 3 reps - 30 sec  hold - Standing Hip Abduction with Counter Support  - 1 x daily - 7 x weekly - 2-3 sets - 10 reps - 2-3 sec  hold - Standing Single Leg Stance with Counter Support  - 1 x daily - 7 x weekly - 1 sets - 3 reps - 10-20 sec  hold - Standing Heel Raise  - 1 x daily - 7 x weekly - 2 sets - 10 reps - Sidelying Hip Abduction  - 1 x daily - 7 x weekly - 2 sets - 10 reps - Seated Great Toe Extension  - 1 x daily - 7 x weekly - 2 sets - 10 reps  Patient Education - Network engineer - Housekeeping  ASSESSMENT:  CLINICAL IMPRESSION: Lengthy discussion on active rest/energy conservation as patient appears to be over doing it with walking and household activity. We discussed using timer on smart watch to set in order to take hourly rest breaks from household activities and use the walking  activity to track her mileage/steps. Introduced static balance activity on unstable surface with patient having the most difficulty maintaining balance with romberg stance on airex.   EVAL: Patient is a 78 y.o. female who was seen today for physical therapy evaluation and treatment for metatarsalgia, tendonitis, and plantar fascitis. She reports history  of chronic bilateral foot/ankle pain stating that she has undergone 2 peroneal tendon repairs on the RLE and 1 on the LLE. Her biggest complaint currently is Lt lateral ankle pain. Upon assessment she is noted to have significant pes cavus bilaterally, Lt ankle weakness with pain provoked with inversion,eversion, and plantarflexor MMT, balance impairments, and gait abnormalities. She will benefit from skilled PT to address the above stated deficits in order to optimize her function and assist in overall pain reduction.   OBJECTIVE IMPAIRMENTS: Abnormal gait, decreased activity tolerance, decreased balance, decreased endurance, difficulty walking, decreased ROM, decreased strength, impaired flexibility, improper body mechanics, postural dysfunction, and pain.    GOALS: Goals reviewed with patient? Yes  SHORT TERM GOALS: Target date: 05/16/2024   Patient will be independent and compliant with initial HEP.   Baseline: issued at eval Goal status: MET  2.  Patient will maintain stance on unstable surface for at least 10 seconds to improve stability when on uneven terrain.  Baseline: unable to maintain tandem stance  05/07/24: romberg on foam x 30 sec  Goal status: MET  3.  Patient will improve bilateral ankle DF AROM by at least 5 degrees to improve gait mechanics.  Baseline: see above  Goal status: INITIAL   LONG TERM GOALS: Target date: 06/16/24  Patient will score >/= 70 on the Foot and Ankle Disability Index (MDC 7) to signify clinically meaningful improvement in functional abilities.   Baseline: see above Goal status: INITIAL  2.   Patient will demonstrate 5/5 Lt ankle inversion/eversion strength to improve gait stability.  Baseline: see above Goal status: INITIAL  3.  Patient will maintain tandem stance for at least 10 seconds to improve stability when navigating on uneven terrain.  Baseline: unable Goal status: INITIAL  4.  Patient will report pain at worst rated as </= 4/10 to reduce current functional limitations.  Baseline: 8 Goal status: INITIAL    PLAN:  PT FREQUENCY: 1-2x/week  PT DURATION: 8 weeks  PLANNED INTERVENTIONS: 97164- PT Re-evaluation, 97750- Physical Performance Testing, 97110-Therapeutic exercises, 97530- Therapeutic activity, [id number]- Neuromuscular re-education, 97535- Self Care, [id number]- Manual therapy, [id number]- Gait training, [phone]- Aquatic Therapy, [phone]- Ionotophoresis 4mg /ml Dexamethasone, Balance training, Taping, Dry Needling, Cryotherapy, and Moist heat  PLAN FOR NEXT SESSION: review and progress HEP prn; progress pain free eversion/inversion strengthening; calf stretching; static balance activity. Hip abductor strengthening   Forrestine Ike, PT, DPT, ATC 05/07/24 2:02 PM

## 2024-05-09 ENCOUNTER — Ambulatory Visit: Payer: Self-pay

## 2024-05-09 DIAGNOSIS — M25572 Pain in left ankle and joints of left foot: Secondary | ICD-10-CM | POA: Diagnosis not present

## 2024-05-09 DIAGNOSIS — M6281 Muscle weakness (generalized): Secondary | ICD-10-CM

## 2024-05-09 DIAGNOSIS — M25571 Pain in right ankle and joints of right foot: Secondary | ICD-10-CM

## 2024-05-09 DIAGNOSIS — R293 Abnormal posture: Secondary | ICD-10-CM

## 2024-05-09 DIAGNOSIS — R2689 Other abnormalities of gait and mobility: Secondary | ICD-10-CM

## 2024-05-09 NOTE — Therapy (Signed)
 OUTPATIENT PHYSICAL THERAPY LOWER EXTREMITY TREATMENT   Patient Name: Larinda Herter MRN: [id number] DOB:05-Oct-1946, 78 y.o., female Today's Date: 05/09/2024  END OF SESSION:  PT End of Session - 05/09/24 1401     Visit Number 6    Number of Visits 17    Date for PT Re-Evaluation 06/16/24    Authorization Type [employer]    PT Start Time 1403    PT Stop Time 1442    PT Time Calculation (min) 39 min    Activity Tolerance Patient tolerated treatment well                Past Medical History:  Diagnosis Date   Abnormal liver function test 08/12/2015   Arthritis    GERD (gastroesophageal reflux disease)    H/O measles    H/O mumps    History of chicken pox 05/18/2015   Hyperglycemia 05/12/2015   Hyperlipidemia    Kidney stones    Neck pain 05/12/2015   Overweight 08/12/2015   Pain in joint, shoulder region 05/12/2015   right   Screen for colon cancer 05/18/2015   TMJ (temporomandibular joint syndrome) 05/18/2015   UTI (lower urinary tract infection)    Past Surgical History:  Procedure Laterality Date   APPENDECTOMY  1966   arthrotomy and menisectomy Right 05/01/15   CARPAL TUNNEL RELEASE  09/15/04   CATARACT EXTRACTION Right 03/03/09   CATARACT EXTRACTION Left 03/17/09   ENDOSCOPIC PLANTAR FASCIOTOMY Right 06/21/05   epicondylitis  08/28/97   Left arm, tennis elbow release   EXTRACORPOREAL SHOCK WAVE LITHOTRIPSY Right 02/27/09   hammer toes Right 03/03/09   right foot 2 hammer toes corrected   LITHOTRIPSY Right 04/09/14   Neck fusion  6/14.07   C5, C6, C7   right ulna shortening Right 11/02/07   SEPTOPLASTY  03/11/10   SHOULDER SURGERY Right 12/09/10   TMJ ARTHROPLASTY Left 03/27/13   ULNAR SHORTENING WITH BONE GRAFT  08/28/02   Patient Active Problem List   Diagnosis Date Noted   Pseudophakia of both eyes 12/04/2020   Aphakia of right eye 11/15/2020   Thoracic facet syndrome 07/22/2020   Cavus deformity of foot, acquired 12/23/2019   Spinal stenosis of cervical region 08/16/2019    Sprain of ligaments of thoracic spine 08/16/2019   Intraocular lens dislocation, initial encounter 07/02/2019   Myalgia 05/17/2019   Visual disturbance 05/17/2019   S/P cervical spinal fusion 08/02/2018   Chronic left shoulder pain 05/23/2018   Spondylosis of cervical 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 myelopathy or radiculopathy 05/23/2018   Whiplash injury to neck 05/23/2018   Work related injury 05/23/2018   Hammertoe of right foot 05/09/2018   Alterations of sensations 05/05/2018   Degenerative cervical disc 05/05/2018   Sacroiliitis (HCC) 05/05/2018   Vertigo 05/05/2018   Vitamin D deficiency 05/05/2018   Prediabetes 01/27/2018   Nausea 12/30/2017   New daily persistent headache 12/30/2017   Chronic or recurrent subluxation of carpometacarpal joint of thumb, right 05/10/2017   DDD (degenerative disc disease), lumbar 01/14/2017   Lumbar facet arthropathy 01/14/2017   Spondylosis 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 01/14/2017   Sacroiliac joint pain 10/14/2016   Osteopenia 04/23/2016   Sprain of ankle 04/08/2016   Post-operative state 04/08/2016   Tendon tear 01/20/2016   Spells 10/14/2015   Overweight 08/12/2015   Abnormal liver function test 08/12/2015   History of chicken pox 05/18/2015   Screen for colon cancer 05/18/2015   TMJ (temporomandibular joint syndrome) 05/18/2015   H/O measles  Pain in joint, shoulder region 05/12/2015   Neck pain 05/12/2015   Hyperglycemia 05/12/2015   Arthritis    Hyperlipidemia    Kidney stones    GERD (gastroesophageal reflux disease)    Tendon tear, ankle, right, subsequent encounter 04/27/2015   Cramping of feet 05/07/2013   Brachial neuritis 10/19/2012   Essential tremor 10/19/2012   Left cervical radiculopathy 10/19/2012   Sprain and strain of shoulder and upper arm 03/02/2012   Sprain of left shoulder girdle 03/02/2012   Thoracic sprain and strain 03/02/2012   Left knee pain 11/03/2011    PCP: Lorenso Romance,  MD  REFERRING PROVIDER: Charity Conch, DPM  REFERRING DIAG:  [phone] (ICD-10-CM) - Metatarsalgia of both feet  M77.9 (ICD-10-CM) - Tendonitis  M72.2 (ICD-10-CM) - Plantar fasciitis    THERAPY DIAG:  Pain in left ankle and joints of left foot  Pain in right ankle and joints of right foot  Muscle weakness (generalized)  Other abnormalities of gait and mobility  Abnormal posture  Rationale for Evaluation and Treatment: Rehabilitation  ONSET DATE: chronic   SUBJECTIVE:   SUBJECTIVE STATEMENT: Patient reports the ankle is sore. Still painful sleeping in the recliner. Has been completing her HEP.   EVAL: Patient reports she has had a lot of changes to the orthotics she has been wearing to find the right fit. She was also found to have a leg length discrepancy with the RLE being shorter. The orthotists was able to add a little heel lift, which has made some difference. She has had 1 tendon repair on the LLE and 2 on the RLE per patient to the peroneal tendon. She is noticing that her foot is caving inward when she stands. At her most recent visit with Dr. Clydia Dart she was told that the tendons have stretched on the Lt and recommended that she start with PT. The pain is mostly localized to the Lt lateral ankle. The Rt ankle/foot is doing ok with metatarsal pad, but it is very painful to be barefoot on the Rt foot.   PERTINENT HISTORY: Per patient 2 peroneal tendon repairs on RLE, 1 on the LLE  Rt plantar fasciotomy  Vertigo  PAIN:  Are you having pain? Yes: NPRS scale: 5 Pain location: left lateral ankle Pain description: sore Aggravating factors: certain positions laying in recliner, heavy cleaning activities Relieving factors: rest and support   PRECAUTIONS: None   WEIGHT BEARING RESTRICTIONS: No  FALLS:  Has patient fallen in last 6 months? No  LIVING ENVIRONMENT: Lives with: lives alone Lives in: House/apartment Stairs: Yes: External: 3 steps; on left going  up Has following equipment at home: None  OCCUPATION: retired    PATIENT GOALS: I want to find out why the ankle just continues to hurt and strengthen it.  NEXT MD VISIT: nothing scheduled   OBJECTIVE:  Note: Objective measures were completed at Evaluation unless otherwise noted.  DIAGNOSTIC FINDINGS: no recent imaging on file   PATIENT SURVEYS:  FADI: 63/100    SENSATION: Not tested  EDEMA:  No obvious swelling about the ankles    POSTURE: pes cavus   LOWER EXTREMITY ROM:  Active ROM Right eval Left eval  Hip flexion    Hip extension    Hip abduction    Hip adduction    Hip internal rotation    Hip external rotation    Knee flexion    Knee extension    Ankle dorsiflexion Lacking 2  Lacking 1   Ankle  plantarflexion 72 74  Ankle inversion 16 16 pain  Ankle eversion 12 11   (Blank rows = not tested)  LOWER EXTREMITY MMT:  MMT Right eval Left eval  Hip flexion    Hip extension    Hip abduction 4 4  Hip adduction    Hip internal rotation    Hip external rotation    Knee flexion    Knee extension    Ankle dorsiflexion 5 5  Ankle plantarflexion DL calf raise  DL calf raise pain   Ankle inversion 5 4 pain  Ankle eversion 5 4 pain   (Blank rows = not tested)  LOWER EXTREMITY SPECIAL TESTS:  None   FUNCTIONAL TESTS:  Tandem stance- unable   GAIT: Distance walked: 20 ft  Assistive device utilized: None Level of assistance: Complete Independence Comments: Rt foot ER, excessive frontal plane movement (with shoes donned as it is too painful for patient to walk barefoot)  [facility] Adult PT Treatment:                                                DATE: 05/09/24 Therapeutic Exercise: Calf stretch x 1 minute  Standing calf raise 2 x 10  Standing heel raise 2 x 10  Resisted ankle inversion red band 2 x 10   Neuromuscular re-ed: Standing hip abduction 2 x 10 red band Standing hip extension 2 x 10 red band  Semi-tandem 2 x 30 sec each  Tandem  multiple trials 5-10 seconds each  Standing march 2 x 10     [facility] Adult PT Treatment:                                                DATE: 05/07/24 Therapeutic Exercise: Calf stretch on wedge x 1 minute  Standing calf raise 2 x 10 HEP review   Neuromuscular re-ed: Standing on airex normal stance x 30 sec  Romberg on airex 3 x 30 sec  Standing on airex normal stance eyes closed 2 x 30 sec  Calf raise on airex 2 x 10   Self Care: Assisted patient in use of smart watch for logging walking activity and using timer to set for rest breaks Energy conservation techniques to allow for active rest    [facility] Adult PT Treatment:                                                DATE: 05/02/24 Therapeutic Exercise: Calf stretch on wedge x 1 minute Standing calf raise 2 x 10  Seated ankle inversion AROM 2 x 10   Neuromuscular re-ed: Sidelying ankle eversion 2 x 10  Sidelying hip abduction 2 x 10  Great toe extension x 10   Self Care: Discussed appropriate gym equipment to utilize at her church gym Recommended to utilize recumbent bike instead of prolonged walking activity to reduce stress on ankles Potential benefits of aquatic PT  [facility] Adult PT Treatment:  DATE: 04/30/24 Therapeutic Exercise: Seated calf raise 2 x 10 on step with 5 lb kettlebell  Seated ankle rockerboard 2 x 10; a/p  Manual Therapy: IASTM Lt peroneals, gastroc,soleus Neuromuscular re-ed: Ankle inversion/eversion isometric with ball x 10 each  Resisted ankle plantarflexion green band 2 x 10  Resisted ankle dorsiflexion green band 2 x 10      PATIENT EDUCATION:  Education details: HEP update Person educated: Patient Education method: Explanation, demo, cues, handout Education comprehension: verbalized understanding, returned demo, cues   HOME EXERCISE PROGRAM: Access Code: [email or web address] URL: [email or web address] Date: 05/09/2024 Prepared by: Forrestine Ike  Exercises - Long Sitting Calf Stretch with Strap  - 2 x daily - 7 x weekly - 3 sets - 30 sec  hold - Isometric Ankle Inversion  - 2 x daily - 7 x weekly - 2 sets - 10 reps - 5 sec  hold - Isometric Ankle Eversion at Wall  - 2 x daily - 7 x weekly - 2 sets - 10 reps - 5 sec  hold - Seated Ankle Plantarflexion with Resistance  - 2 x daily - 7 x weekly - 2 sets - 10 reps - Supine Piriformis Stretch with Leg Straight  - 1 x daily - 7 x weekly - 1 sets - 3 reps - 30 sec  hold - Standing Hip Abduction with Counter Support  - 1 x daily - 7 x weekly - 2-3 sets - 10 reps - 2-3 sec  hold - Standing Single Leg Stance with Counter Support  - 1 x daily - 7 x weekly - 1 sets - 3 reps - 10-20 sec  hold - Standing Heel Raise  - 1 x daily - 7 x weekly - 2 sets - 10 reps - Sidelying Hip Abduction  - 1 x daily - 7 x weekly - 2 sets - 10 reps - Seated Great Toe Extension  - 1 x daily - 7 x weekly - 2 sets - 10 reps - Standing Hip Abduction with Resistance at Ankles and Counter Support  - 1 x daily - 7 x weekly - 2 sets - 10 reps - Standing Hip Extension with Resistance at Ankles and Counter Support  - 1 x daily - 7 x weekly - 2 sets - 10 reps  ASSESSMENT:  CLINICAL IMPRESSION: Patient with moderate soreness about Lt lateral ankle upon arrival. Continued with ankle and hip strengthening with good tolerance. Mild difficulty maintaining neutral foot positioning with standing ankle strengthening. Mild low back discomfort with standing hip strengthening, but was able to complete prescribed sets/reps. Progressed resisted ankle strengthening without onset of ankle pain. Challenged with static and dynamic balance activity with ability to maintain tandem stance for approximately 5 seconds bilaterally.   EVAL: Patient is a 78 y.o. female who was seen today for physical therapy evaluation and treatment for metatarsalgia, tendonitis, and plantar fascitis. She reports history of chronic bilateral foot/ankle pain stating  that she has undergone 2 peroneal tendon repairs on the RLE and 1 on the LLE. Her biggest complaint currently is Lt lateral ankle pain. Upon assessment she is noted to have significant pes cavus bilaterally, Lt ankle weakness with pain provoked with inversion,eversion, and plantarflexor MMT, balance impairments, and gait abnormalities. She will benefit from skilled PT to address the above stated deficits in order to optimize her function and assist in overall pain reduction.   OBJECTIVE IMPAIRMENTS: Abnormal gait, decreased activity tolerance, decreased balance, decreased endurance, difficulty walking, decreased ROM, decreased strength, impaired  flexibility, improper body mechanics, postural dysfunction, and pain.    GOALS: Goals reviewed with patient? Yes  SHORT TERM GOALS: Target date: 05/16/2024   Patient will be independent and compliant with initial HEP.   Baseline: issued at eval Goal status: MET  2.  Patient will maintain stance on unstable surface for at least 10 seconds to improve stability when on uneven terrain.  Baseline: unable to maintain tandem stance  05/07/24: romberg on foam x 30 sec  Goal status: MET  3.  Patient will improve bilateral ankle DF AROM by at least 5 degrees to improve gait mechanics.  Baseline: see above  Goal status: INITIAL   LONG TERM GOALS: Target date: 06/16/24  Patient will score >/= 70 on the Foot and Ankle Disability Index (MDC 7) to signify clinically meaningful improvement in functional abilities.   Baseline: see above Goal status: INITIAL  2.  Patient will demonstrate 5/5 Lt ankle inversion/eversion strength to improve gait stability.  Baseline: see above Goal status: INITIAL  3.  Patient will maintain tandem stance for at least 10 seconds to improve stability when navigating on uneven terrain.  Baseline: unable Goal status: INITIAL  4.  Patient will report pain at worst rated as </= 4/10 to reduce current functional limitations.   Baseline: 8 Goal status: INITIAL    PLAN:  PT FREQUENCY: 1-2x/week  PT DURATION: 8 weeks  PLANNED INTERVENTIONS: 97164- PT Re-evaluation, 97750- Physical Performance Testing, 97110-Therapeutic exercises, 97530- Therapeutic activity, [id number]- Neuromuscular re-education, 97535- Self Care, [id number]- Manual therapy, [id number]- Gait training, [phone]- Aquatic Therapy, [phone]- Ionotophoresis 4mg /ml Dexamethasone, Balance training, Taping, Dry Needling, Cryotherapy, and Moist heat  PLAN FOR NEXT SESSION: review and progress HEP prn; progress pain free eversion/inversion strengthening; calf stretching; static balance activity. Hip abductor strengthening   Forrestine Ike, PT, DPT, ATC 05/09/24 2:43 PM

## 2024-05-16 ENCOUNTER — Ambulatory Visit

## 2024-05-16 DIAGNOSIS — R2689 Other abnormalities of gait and mobility: Secondary | ICD-10-CM

## 2024-05-16 DIAGNOSIS — M25572 Pain in left ankle and joints of left foot: Secondary | ICD-10-CM

## 2024-05-16 DIAGNOSIS — R293 Abnormal posture: Secondary | ICD-10-CM

## 2024-05-16 DIAGNOSIS — M6281 Muscle weakness (generalized): Secondary | ICD-10-CM

## 2024-05-16 DIAGNOSIS — M25571 Pain in right ankle and joints of right foot: Secondary | ICD-10-CM

## 2024-05-16 NOTE — Therapy (Signed)
 OUTPATIENT PHYSICAL THERAPY LOWER EXTREMITY TREATMENT   Patient Name: Rebecca Lewis MRN: [id number] DOB:1946-03-05, 78 y.o., female Today's Date: 05/16/2024  END OF SESSION:  PT End of Session - 05/16/24 1403     Visit Number 7    Number of Visits 17    Date for PT Re-Evaluation 06/16/24    Authorization Type [employer]    PT Start Time 1403    PT Stop Time 1442    PT Time Calculation (min) 39 min    Activity Tolerance Patient tolerated treatment well             Past Medical History:  Diagnosis Date   Abnormal liver function test 08/12/2015   Arthritis    GERD (gastroesophageal reflux disease)    H/O measles    H/O mumps    History of chicken pox 05/18/2015   Hyperglycemia 05/12/2015   Hyperlipidemia    Kidney stones    Neck pain 05/12/2015   Overweight 08/12/2015   Pain in joint, shoulder region 05/12/2015   right   Screen for colon cancer 05/18/2015   TMJ (temporomandibular joint syndrome) 05/18/2015   UTI (lower urinary tract infection)    Past Surgical History:  Procedure Laterality Date   APPENDECTOMY  1966   arthrotomy and menisectomy Right 05/01/15   CARPAL TUNNEL RELEASE  09/15/04   CATARACT EXTRACTION Right 03/03/09   CATARACT EXTRACTION Left 03/17/09   ENDOSCOPIC PLANTAR FASCIOTOMY Right 06/21/05   epicondylitis  08/28/97   Left arm, tennis elbow release   EXTRACORPOREAL SHOCK WAVE LITHOTRIPSY Right 02/27/09   hammer toes Right 03/03/09   right foot 2 hammer toes corrected   LITHOTRIPSY Right 04/09/14   Neck fusion  6/14.07   C5, C6, C7   right ulna shortening Right 11/02/07   SEPTOPLASTY  03/11/10   SHOULDER SURGERY Right 12/09/10   TMJ ARTHROPLASTY Left 03/27/13   ULNAR SHORTENING WITH BONE GRAFT  08/28/02   Patient Active Problem List   Diagnosis Date Noted   Pseudophakia of both eyes 12/04/2020   Aphakia of right eye 11/15/2020   Thoracic facet syndrome 07/22/2020   Cavus deformity of foot, acquired 12/23/2019   Spinal stenosis of cervical region 08/16/2019    Sprain of ligaments of thoracic spine 08/16/2019   Intraocular lens dislocation, initial encounter 07/02/2019   Myalgia 05/17/2019   Visual disturbance 05/17/2019   S/P cervical spinal fusion 08/02/2018   Chronic left shoulder pain 05/23/2018   Spondylosis of cervical 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 myelopathy or radiculopathy 05/23/2018   Whiplash injury to neck 05/23/2018   Work related injury 05/23/2018   Hammertoe of right foot 05/09/2018   Alterations of sensations 05/05/2018   Degenerative cervical disc 05/05/2018   Sacroiliitis (HCC) 05/05/2018   Vertigo 05/05/2018   Vitamin D deficiency 05/05/2018   Prediabetes 01/27/2018   Nausea 12/30/2017   New daily persistent headache 12/30/2017   Chronic or recurrent subluxation of carpometacarpal joint of thumb, right 05/10/2017   DDD (degenerative disc disease), lumbar 01/14/2017   Lumbar facet arthropathy 01/14/2017   Spondylosis 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 01/14/2017   Sacroiliac joint pain 10/14/2016   Osteopenia 04/23/2016   Sprain of ankle 04/08/2016   Post-operative state 04/08/2016   Tendon tear 01/20/2016   Spells 10/14/2015   Overweight 08/12/2015   Abnormal liver function test 08/12/2015   History of chicken pox 05/18/2015   Screen for colon cancer 05/18/2015   TMJ (temporomandibular joint syndrome) 05/18/2015   H/O measles    Pain  in joint, shoulder region 05/12/2015   Neck pain 05/12/2015   Hyperglycemia 05/12/2015   Arthritis    Hyperlipidemia    Kidney stones    GERD (gastroesophageal reflux disease)    Tendon tear, ankle, right, subsequent encounter 04/27/2015   Cramping of feet 05/07/2013   Brachial neuritis 10/19/2012   Essential tremor 10/19/2012   Left cervical radiculopathy 10/19/2012   Sprain and strain of shoulder and upper arm 03/02/2012   Sprain of left shoulder girdle 03/02/2012   Thoracic sprain and strain 03/02/2012   Left knee pain 11/03/2011    PCP: Lorenso Romance,  MD  REFERRING PROVIDER: Charity Conch, DPM  REFERRING DIAG:  [phone] (ICD-10-CM) - Metatarsalgia of both feet  M77.9 (ICD-10-CM) - Tendonitis  M72.2 (ICD-10-CM) - Plantar fasciitis    THERAPY DIAG:  Pain in left ankle and joints of left foot  Pain in right ankle and joints of right foot  Muscle weakness (generalized)  Other abnormalities of gait and mobility  Abnormal posture  Rationale for Evaluation and Treatment: Rehabilitation  ONSET DATE: chronic   SUBJECTIVE:   SUBJECTIVE STATEMENT: Patient reports she has been having GI issues since last session. She was put on antibiotic for this, but is still eating a pretty bland diet. The foot isn't hurting when she is walking on even surfaces, but notices some of the exercises cause pain.   EVAL: Patient reports she has had a lot of changes to the orthotics she has been wearing to find the right fit. She was also found to have a leg length discrepancy with the RLE being shorter. The orthotists was able to add a little heel lift, which has made some difference. She has had 1 tendon repair on the LLE and 2 on the RLE per patient to the peroneal tendon. She is noticing that her foot is caving inward when she stands. At her most recent visit with Dr. Clydia Dart she was told that the tendons have stretched on the Lt and recommended that she start with PT. The pain is mostly localized to the Lt lateral ankle. The Rt ankle/foot is doing ok with metatarsal pad, but it is very painful to be barefoot on the Rt foot.   PERTINENT HISTORY: Per patient 2 peroneal tendon repairs on RLE, 1 on the LLE  Rt plantar fasciotomy  Vertigo  PAIN:  Are you having pain? Yes: NPRS scale: 3 Pain location: left lateral ankle Pain description: sore Aggravating factors: certain positions laying in recliner, heavy cleaning activities Relieving factors: rest and support   PRECAUTIONS: None   WEIGHT BEARING RESTRICTIONS: No  FALLS:  Has patient  fallen in last 6 months? No  LIVING ENVIRONMENT: Lives with: lives alone Lives in: House/apartment Stairs: Yes: External: 3 steps; on left going up Has following equipment at home: None  OCCUPATION: retired    PATIENT GOALS: I want to find out why the ankle just continues to hurt and strengthen it.  NEXT MD VISIT: nothing scheduled   OBJECTIVE:  Note: Objective measures were completed at Evaluation unless otherwise noted.  DIAGNOSTIC FINDINGS: no recent imaging on file   PATIENT SURVEYS:  FADI: 63/100    SENSATION: Not tested  EDEMA:  No obvious swelling about the ankles    POSTURE: pes cavus   LOWER EXTREMITY ROM:  Active ROM Right eval Left eval 05/16/24   Hip flexion     Hip extension     Hip abduction     Hip adduction  Hip internal rotation     Hip external rotation     Knee flexion     Knee extension     Ankle dorsiflexion Lacking 2  Lacking 1  Lt: 4; Rt: 1  Ankle plantarflexion 72 74   Ankle inversion 16 16 pain WNL bilateral   Ankle eversion 12 11 WNL bilateral; pain Lt    (Blank rows = not tested)  LOWER EXTREMITY MMT:  MMT Right eval Left eval  Hip flexion    Hip extension    Hip abduction 4 4  Hip adduction    Hip internal rotation    Hip external rotation    Knee flexion    Knee extension    Ankle dorsiflexion 5 5  Ankle plantarflexion DL calf raise  DL calf raise pain   Ankle inversion 5 4 pain  Ankle eversion 5 4 pain   (Blank rows = not tested)  LOWER EXTREMITY SPECIAL TESTS:  None   FUNCTIONAL TESTS:  Tandem stance- unable   GAIT: Distance walked: 20 ft  Assistive device utilized: None Level of assistance: Complete Independence Comments: Rt foot ER, excessive frontal plane movement (with shoes donned as it is too painful for patient to walk barefoot)  [facility] Adult PT Treatment:                                                DATE: 05/16/24 Therapeutic Exercise: Ankle AROM  Seated calf stretch x 30 sec each   Resisted plantarflexion green band x 10 each Isometric eversion/inversion x 10; 5 sec hold  Standing calf raise 2 x 10   Neuromuscular re-ed: Great toe extension x 10 each  SLS LLE multiple reps  Standing hip abduction red band x 10 each Standing hip extension red band x 10 each     [facility] Adult PT Treatment:                                                DATE: 05/09/24 Therapeutic Exercise: Calf stretch x 1 minute  Standing calf raise 2 x 10  Standing heel raise 2 x 10  Resisted ankle inversion red band 2 x 10   Neuromuscular re-ed: Standing hip abduction 2 x 10 red band Standing hip extension 2 x 10 red band  Semi-tandem 2 x 30 sec each  Tandem multiple trials 5-10 seconds each  Standing march 2 x 10     [facility] Adult PT Treatment:                                                DATE: 05/07/24 Therapeutic Exercise: Calf stretch on wedge x 1 minute  Standing calf raise 2 x 10 HEP review   Neuromuscular re-ed: Standing on airex normal stance x 30 sec  Romberg on airex 3 x 30 sec  Standing on airex normal stance eyes closed 2 x 30 sec  Calf raise on airex 2 x 10   Self Care: Assisted patient in use of smart watch for logging walking activity and using timer to set for rest breaks Energy conservation techniques  to allow for active rest     PATIENT EDUCATION:  Education details: HEP review Person educated: Patient Education method: Explanation, demo, cues,  Education comprehension: verbalized understanding, returned demo, cues   HOME EXERCISE PROGRAM: Access Code: [email or web address] URL: [email or web address] Date: 05/09/2024 Prepared by: Forrestine Ike  Exercises - Long Sitting Calf Stretch with Strap  - 2 x daily - 7 x weekly - 3 sets - 30 sec  hold - Isometric Ankle Inversion  - 2 x daily - 7 x weekly - 2 sets - 10 reps - 5 sec  hold - Isometric Ankle Eversion at Wall  - 2 x daily - 7 x weekly - 2 sets - 10 reps - 5 sec  hold - Seated Ankle Plantarflexion  with Resistance  - 2 x daily - 7 x weekly - 2 sets - 10 reps - Supine Piriformis Stretch with Leg Straight  - 1 x daily - 7 x weekly - 1 sets - 3 reps - 30 sec  hold - Standing Hip Abduction with Counter Support  - 1 x daily - 7 x weekly - 2-3 sets - 10 reps - 2-3 sec  hold - Standing Single Leg Stance with Counter Support  - 1 x daily - 7 x weekly - 1 sets - 3 reps - 10-20 sec  hold - Standing Heel Raise  - 1 x daily - 7 x weekly - 2 sets - 10 reps - Sidelying Hip Abduction  - 1 x daily - 7 x weekly - 2 sets - 10 reps - Seated Great Toe Extension  - 1 x daily - 7 x weekly - 2 sets - 10 reps - Standing Hip Abduction with Resistance at Ankles and Counter Support  - 1 x daily - 7 x weekly - 2 sets - 10 reps - Standing Hip Extension with Resistance at Ankles and Counter Support  - 1 x daily - 7 x weekly - 2 sets - 10 reps  ASSESSMENT:  CLINICAL IMPRESSION: Bilateral DF AROM has improved bilaterally, but remains limited. We focused on performing/reviewing HEP as she reports some of her exercises have been causing pain about the ankle. Requires cues for setup of calf stretch and isometric ankle strengthening. Initial pain with eversion isometric, but once form corrected no onset of pain. Remains challenged with isolating great toe extension. She reported soreness about the Lt lateral ankle rated as a 5/10 at conclusion.   EVAL: Patient is a 78 y.o. female who was seen today for physical therapy evaluation and treatment for metatarsalgia, tendonitis, and plantar fascitis. She reports history of chronic bilateral foot/ankle pain stating that she has undergone 2 peroneal tendon repairs on the RLE and 1 on the LLE. Her biggest complaint currently is Lt lateral ankle pain. Upon assessment she is noted to have significant pes cavus bilaterally, Lt ankle weakness with pain provoked with inversion,eversion, and plantarflexor MMT, balance impairments, and gait abnormalities. She will benefit from skilled PT to  address the above stated deficits in order to optimize her function and assist in overall pain reduction.   OBJECTIVE IMPAIRMENTS: Abnormal gait, decreased activity tolerance, decreased balance, decreased endurance, difficulty walking, decreased ROM, decreased strength, impaired flexibility, improper body mechanics, postural dysfunction, and pain.    GOALS: Goals reviewed with patient? Yes  SHORT TERM GOALS: Target date: 05/16/2024   Patient will be independent and compliant with initial HEP.   Baseline: issued at eval Goal status: MET  2.  Patient will maintain stance on unstable  surface for at least 10 seconds to improve stability when on uneven terrain.  Baseline: unable to maintain tandem stance  05/07/24: romberg on foam x 30 sec  Goal status: MET  3.  Patient will improve bilateral ankle DF AROM by at least 5 degrees to improve gait mechanics.  Baseline: see above  Goal status: partially met    LONG TERM GOALS: Target date: 06/16/24  Patient will score >/= 70 on the Foot and Ankle Disability Index (MDC 7) to signify clinically meaningful improvement in functional abilities.   Baseline: see above Goal status: INITIAL  2.  Patient will demonstrate 5/5 Lt ankle inversion/eversion strength to improve gait stability.  Baseline: see above Goal status: INITIAL  3.  Patient will maintain tandem stance for at least 10 seconds to improve stability when navigating on uneven terrain.  Baseline: unable Goal status: INITIAL  4.  Patient will report pain at worst rated as </= 4/10 to reduce current functional limitations.  Baseline: 8 Goal status: INITIAL    PLAN:  PT FREQUENCY: 1-2x/week  PT DURATION: 8 weeks  PLANNED INTERVENTIONS: 97164- PT Re-evaluation, 97750- Physical Performance Testing, 97110-Therapeutic exercises, 97530- Therapeutic activity, [id number]- Neuromuscular re-education, 97535- Self Care, [id number]- Manual therapy, [id number]- Gait training, [phone]- Aquatic Therapy,  [phone]- Ionotophoresis 4mg /ml Dexamethasone, Balance training, Taping, Dry Needling, Cryotherapy, and Moist heat  PLAN FOR NEXT SESSION: review and progress HEP prn; progress pain free eversion/inversion strengthening; calf stretching; static balance activity. Hip abductor strengthening   Forrestine Ike, PT, DPT, ATC 05/16/24 2:43 PM

## 2024-05-18 ENCOUNTER — Ambulatory Visit: Admitting: Podiatry

## 2024-05-18 ENCOUNTER — Ambulatory Visit (INDEPENDENT_AMBULATORY_CARE_PROVIDER_SITE_OTHER)

## 2024-05-18 DIAGNOSIS — M2041 Other hammer toe(s) (acquired), right foot: Secondary | ICD-10-CM | POA: Diagnosis not present

## 2024-05-18 DIAGNOSIS — M216X9 Other acquired deformities of unspecified foot: Secondary | ICD-10-CM

## 2024-05-18 NOTE — Progress Notes (Signed)
 Subjective: Chief Complaint  Patient presents with   Hammer Toe    RM#11 Right foot concerns of toes changing shape.    78 year old female presents the office for above concerns.  She states that she wears 2 pairs of shoes that they are the same shoe.  When she wears the 1 shoe to the left that helps a lot and when she does not wear it she notices increased discomfort to her foot.  She is also concerned morning the toes of the right foot drop back.  She states it hurts to put shoes on her's.  She does not recall any injuries.  She is currently doing physical therapy for the left.  The patch that has been for the last appointment with the metatarsal pad, toe spacer has been helpful.  She is asking any more these.   Objective: AAO x3, NAD DP/PT pulses palpable bilaterally, CRT less than 3 seconds Sensation intact with Semmes Weinstein monofilament Cavus foot type is present.  Not able to elicit any area pinpoint tenderness.  Flexible hammertoe, hallux malleus noted of the hallux bilaterally.  Flexor, extensor tendons appear to be intact.  There is no edema, erythema.  Ankle range of motion intact.  No open lesions or pre-ulcerative lesions.  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  -X-rays reviewed.  Multiple views obtained.  Compared to prior x-rays of the right foot the digital contractures do not seem to be significant worsened.  There is no evidence of acute fracture. -I ordered a new offloading pads. We will make her new heel lift.  She will come by Monday to pick both of these products. -Continue range of motion exercises of the toes we discussed different toe exercises.  Continue physical therapy.  No follow-ups on file.  Rebecca Lewis DPM

## 2024-05-22 ENCOUNTER — Encounter: Payer: Self-pay | Admitting: Rehabilitative and Restorative Service Providers"

## 2024-05-22 ENCOUNTER — Telehealth: Payer: Self-pay | Admitting: Podiatry

## 2024-05-22 ENCOUNTER — Ambulatory Visit: Admitting: Rehabilitative and Restorative Service Providers"

## 2024-05-22 DIAGNOSIS — R2689 Other abnormalities of gait and mobility: Secondary | ICD-10-CM

## 2024-05-22 DIAGNOSIS — M25572 Pain in left ankle and joints of left foot: Secondary | ICD-10-CM | POA: Diagnosis not present

## 2024-05-22 DIAGNOSIS — R293 Abnormal posture: Secondary | ICD-10-CM

## 2024-05-22 DIAGNOSIS — M6281 Muscle weakness (generalized): Secondary | ICD-10-CM

## 2024-05-22 DIAGNOSIS — M25571 Pain in right ankle and joints of right foot: Secondary | ICD-10-CM

## 2024-05-22 NOTE — Telephone Encounter (Signed)
 Patient stated Do Not forget to notify her! :)

## 2024-05-22 NOTE — Therapy (Signed)
 OUTPATIENT PHYSICAL THERAPY LOWER EXTREMITY TREATMENT   Patient Name: Rebecca Lewis MRN: [id number] DOB:10/23/1946, 78 y.o., female Today's Date: 05/22/2024  END OF SESSION:  PT End of Session - 05/22/24 1443     Visit Number 8    Number of Visits 17    Date for PT Re-Evaluation 06/16/24    Authorization Type [employer]    PT Start Time 1445    PT Stop Time 1525    PT Time Calculation (min) 40 min    Activity Tolerance Patient tolerated treatment well             Past Medical History:  Diagnosis Date   Abnormal liver function test 08/12/2015   Arthritis    GERD (gastroesophageal reflux disease)    H/O measles    H/O mumps    History of chicken pox 05/18/2015   Hyperglycemia 05/12/2015   Hyperlipidemia    Kidney stones    Neck pain 05/12/2015   Overweight 08/12/2015   Pain in joint, shoulder region 05/12/2015   right   Screen for colon cancer 05/18/2015   TMJ (temporomandibular joint syndrome) 05/18/2015   UTI (lower urinary tract infection)    Past Surgical History:  Procedure Laterality Date   APPENDECTOMY  1966   arthrotomy and menisectomy Right 05/01/15   CARPAL TUNNEL RELEASE  09/15/04   CATARACT EXTRACTION Right 03/03/09   CATARACT EXTRACTION Left 03/17/09   ENDOSCOPIC PLANTAR FASCIOTOMY Right 06/21/05   epicondylitis  08/28/97   Left arm, tennis elbow release   EXTRACORPOREAL SHOCK WAVE LITHOTRIPSY Right 02/27/09   hammer toes Right 03/03/09   right foot 2 hammer toes corrected   LITHOTRIPSY Right 04/09/14   Neck fusion  6/14.07   C5, C6, C7   right ulna shortening Right 11/02/07   SEPTOPLASTY  03/11/10   SHOULDER SURGERY Right 12/09/10   TMJ ARTHROPLASTY Left 03/27/13   ULNAR SHORTENING WITH BONE GRAFT  08/28/02   Patient Active Problem List   Diagnosis Date Noted   Pseudophakia of both eyes 12/04/2020   Aphakia of right eye 11/15/2020   Thoracic facet syndrome 07/22/2020   Cavus deformity of foot, acquired 12/23/2019   Spinal stenosis of cervical region 08/16/2019    Sprain of ligaments of thoracic spine 08/16/2019   Intraocular lens dislocation, initial encounter 07/02/2019   Myalgia 05/17/2019   Visual disturbance 05/17/2019   S/P cervical spinal fusion 08/02/2018   Chronic left shoulder pain 05/23/2018   Spondylosis of cervical 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 myelopathy or radiculopathy 05/23/2018   Whiplash injury to neck 05/23/2018   Work related injury 05/23/2018   Hammertoe of right foot 05/09/2018   Alterations of sensations 05/05/2018   Degenerative cervical disc 05/05/2018   Sacroiliitis (HCC) 05/05/2018   Vertigo 05/05/2018   Vitamin D deficiency 05/05/2018   Prediabetes 01/27/2018   Nausea 12/30/2017   New daily persistent headache 12/30/2017   Chronic or recurrent subluxation of carpometacarpal joint of thumb, right 05/10/2017   DDD (degenerative disc disease), lumbar 01/14/2017   Lumbar facet arthropathy 01/14/2017   Spondylosis 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 01/14/2017   Sacroiliac joint pain 10/14/2016   Osteopenia 04/23/2016   Sprain of ankle 04/08/2016   Post-operative state 04/08/2016   Tendon tear 01/20/2016   Spells 10/14/2015   Overweight 08/12/2015   Abnormal liver function test 08/12/2015   History of chicken pox 05/18/2015   Screen for colon cancer 05/18/2015   TMJ (temporomandibular joint syndrome) 05/18/2015   H/O measles    Pain  in joint, shoulder region 05/12/2015   Neck pain 05/12/2015   Hyperglycemia 05/12/2015   Arthritis    Hyperlipidemia    Kidney stones    GERD (gastroesophageal reflux disease)    Tendon tear, ankle, right, subsequent encounter 04/27/2015   Cramping of feet 05/07/2013   Brachial neuritis 10/19/2012   Essential tremor 10/19/2012   Left cervical radiculopathy 10/19/2012   Sprain and strain of shoulder and upper arm 03/02/2012   Sprain of left shoulder girdle 03/02/2012   Thoracic sprain and strain 03/02/2012   Left knee pain 11/03/2011    PCP: Clarance Joen HERO,  MD  REFERRING PROVIDER: Gershon Donnice SAUNDERS, DPM  REFERRING DIAG:  [id number] (ICD-10-CM) - Metatarsalgia of both feet  M77.9 (ICD-10-CM) - Tendonitis  M72.2 (ICD-10-CM) - Plantar fasciitis    THERAPY DIAG:  Pain in left ankle and joints of left foot  Pain in right ankle and joints of right foot  Muscle weakness (generalized)  Other abnormalities of gait and mobility  Abnormal posture  Rationale for Evaluation and Treatment: Rehabilitation  ONSET DATE: chronic   SUBJECTIVE:   SUBJECTIVE STATEMENT: Patient reports that she saw Dr Alona Friday and he is making adjustments for her inserts. She worked in her yard for a couple of hours Friday and again Saturday. She has increased pain in the L IT band area and her feet due to increased activity. The R foot is fine but the L one is hurting. She has difficulty taking orthotics in and out of her shoes due to her hands surgeries. Some shoes with some inserts are better than others. Waiting on Dr Alona to return orthotic for R foot. Has toe socks on today and would like to skip the big toe extension so she doesn't have to take socks off and on.   EVAL: Patient reports she has had a lot of changes to the orthotics she has been wearing to find the right fit. She was also found to have a leg length discrepancy with the RLE being shorter. The orthotists was able to add a little heel lift, which has made some difference. She has had 1 tendon repair on the LLE and 2 on the RLE per patient to the peroneal tendon. She is noticing that her foot is caving inward when she stands. At her most recent visit with Dr. Gershon she was told that the tendons have stretched on the Lt and recommended that she start with PT. The pain is mostly localized to the Lt lateral ankle. The Rt ankle/foot is doing ok with metatarsal pad, but it is very painful to be barefoot on the Rt foot.   PERTINENT HISTORY: Per patient 2 peroneal tendon repairs on RLE, 1 on the LLE   Rt plantar fasciotomy  Vertigo  PAIN:  Are you having pain? Yes: NPRS scale: 5 Pain location: left lateral ankle Pain description: sore Aggravating factors: certain positions laying in recliner, heavy cleaning activities Relieving factors: rest and support   PRECAUTIONS: None   WEIGHT BEARING RESTRICTIONS: No  FALLS:  Has patient fallen in last 6 months? No  LIVING ENVIRONMENT: Lives with: lives alone Lives in: House/apartment Stairs: Yes: External: 3 steps; on left going up Has following equipment at home: None  OCCUPATION: retired    PATIENT GOALS: I want to find out why the ankle just continues to hurt and strengthen it.  NEXT MD VISIT: nothing scheduled   OBJECTIVE:  Note: Objective measures were completed at Evaluation unless otherwise noted.  DIAGNOSTIC  FINDINGS: no recent imaging on file   PATIENT SURVEYS:  FADI: 63/100    SENSATION: Not tested  EDEMA:  No obvious swelling about the ankles    POSTURE: pes cavus   LOWER EXTREMITY ROM:  Active ROM Right eval Left eval 05/16/24   Hip flexion     Hip extension     Hip abduction     Hip adduction     Hip internal rotation     Hip external rotation     Knee flexion     Knee extension     Ankle dorsiflexion Lacking 2  Lacking 1  Lt: 4; Rt: 1  Ankle plantarflexion 72 74   Ankle inversion 16 16 pain WNL bilateral   Ankle eversion 12 11 WNL bilateral; pain Lt    (Blank rows = not tested)  LOWER EXTREMITY MMT:  MMT Right eval Left eval  Hip flexion    Hip extension    Hip abduction 4 4  Hip adduction    Hip internal rotation    Hip external rotation    Knee flexion    Knee extension    Ankle dorsiflexion 5 5  Ankle plantarflexion DL calf raise  DL calf raise pain   Ankle inversion 5 4 pain  Ankle eversion 5 4 pain   (Blank rows = not tested)  LOWER EXTREMITY SPECIAL TESTS:  None   FUNCTIONAL TESTS:  Tandem stance- unable   GAIT: Distance walked: 20 ft  Assistive device  utilized: None Level of assistance: Complete Independence Comments: Rt foot ER, excessive frontal plane movement (with shoes donned as it is too painful for patient to walk barefoot)    [facility] Adult PT Treatment:                                                DATE: 05/22/24 Therapeutic Exercise: Nustep L6 x 6  Ankle AROM PF/DF Seated calf stretch x 30 sec x 2 each  Resisted plantarflexion green band x 10 R/L Isometric eversion/inversion x 10; 5 sec hold  Standing calf raise x 5 (discomfort L foot)   Neuromuscular re-ed: SLS L/R LE multiple reps (discomfort L - limited reps)  Standing hip abduction red band x 10 R/L  Standing hip extension red band x 10 R/L    [facility] Adult PT Treatment:                                                DATE: 05/16/24 Therapeutic Exercise: Ankle AROM  Seated calf stretch x 30 sec each  Resisted plantarflexion green band x 10 each Isometric eversion/inversion x 10; 5 sec hold  Standing calf raise 2 x 10   Neuromuscular re-ed: Great toe extension x 10 each  SLS LLE multiple reps  Standing hip abduction red band x 10 each Standing hip extension red band x 10 each     [facility] Adult PT Treatment:                                                DATE: 05/09/24 Therapeutic Exercise: Calf stretch x  1 minute  Standing calf raise 2 x 10  Standing heel raise 2 x 10  Resisted ankle inversion red band 2 x 10   Neuromuscular re-ed: Standing hip abduction 2 x 10 red band Standing hip extension 2 x 10 red band  Semi-tandem 2 x 30 sec each  Tandem multiple trials 5-10 seconds each  Standing march 2 x 10     PATIENT EDUCATION:  Education details: HEP review Person educated: Patient Education method: Programmer, multimedia, demo, cues,  Education comprehension: verbalized understanding, returned demo, cues   HOME EXERCISE PROGRAM: Access Code: [email or web address] URL: [email or web address] Date: 05/09/2024 Prepared by: Lucie Meeter  Exercises - Long Sitting  Calf Stretch with Strap  - 2 x daily - 7 x weekly - 3 sets - 30 sec  hold - Isometric Ankle Inversion  - 2 x daily - 7 x weekly - 2 sets - 10 reps - 5 sec  hold - Isometric Ankle Eversion at Wall  - 2 x daily - 7 x weekly - 2 sets - 10 reps - 5 sec  hold - Seated Ankle Plantarflexion with Resistance  - 2 x daily - 7 x weekly - 2 sets - 10 reps - Supine Piriformis Stretch with Leg Straight  - 1 x daily - 7 x weekly - 1 sets - 3 reps - 30 sec  hold - Standing Hip Abduction with Counter Support  - 1 x daily - 7 x weekly - 2-3 sets - 10 reps - 2-3 sec  hold - Standing Single Leg Stance with Counter Support  - 1 x daily - 7 x weekly - 1 sets - 3 reps - 10-20 sec  hold - Standing Heel Raise  - 1 x daily - 7 x weekly - 2 sets - 10 reps - Sidelying Hip Abduction  - 1 x daily - 7 x weekly - 2 sets - 10 reps - Seated Great Toe Extension  - 1 x daily - 7 x weekly - 2 sets - 10 reps - Standing Hip Abduction with Resistance at Ankles and Counter Support  - 1 x daily - 7 x weekly - 2 sets - 10 reps - Standing Hip Extension with Resistance at Ankles and Counter Support  - 1 x daily - 7 x weekly - 2 sets - 10 reps  ASSESSMENT:  CLINICAL IMPRESSION: Patient reports that she has increased from working in the yard several hours last week. She is waiting on adjustments for orthotics from Dr Alona. Treatment continued to with ankle ROM and strengthening bilat ankles/feet. Some increased discomfort L foot with SLS and heel raises.     EVAL: Patient is a 78 y.o. female who was seen today for physical therapy evaluation and treatment for metatarsalgia, tendonitis, and plantar fascitis. She reports history of chronic bilateral foot/ankle pain stating that she has undergone 2 peroneal tendon repairs on the RLE and 1 on the LLE. Her biggest complaint currently is Lt lateral ankle pain. Upon assessment she is noted to have significant pes cavus bilaterally, Lt ankle weakness with pain provoked with inversion,eversion, and  plantarflexor MMT, balance impairments, and gait abnormalities. She will benefit from skilled PT to address the above stated deficits in order to optimize her function and assist in overall pain reduction.   OBJECTIVE IMPAIRMENTS: Abnormal gait, decreased activity tolerance, decreased balance, decreased endurance, difficulty walking, decreased ROM, decreased strength, impaired flexibility, improper body mechanics, postural dysfunction, and pain.    GOALS: Goals reviewed with patient? Yes  SHORT  TERM GOALS: Target date: 05/16/2024   Patient will be independent and compliant with initial HEP.   Baseline: issued at eval Goal status: MET  2.  Patient will maintain stance on unstable surface for at least 10 seconds to improve stability when on uneven terrain.  Baseline: unable to maintain tandem stance  05/07/24: romberg on foam x 30 sec  Goal status: MET  3.  Patient will improve bilateral ankle DF AROM by at least 5 degrees to improve gait mechanics.  Baseline: see above  Goal status: partially met    LONG TERM GOALS: Target date: 06/16/24  Patient will score >/= 70 on the Foot and Ankle Disability Index (MDC 7) to signify clinically meaningful improvement in functional abilities.   Baseline: see above Goal status: INITIAL  2.  Patient will demonstrate 5/5 Lt ankle inversion/eversion strength to improve gait stability.  Baseline: see above Goal status: INITIAL  3.  Patient will maintain tandem stance for at least 10 seconds to improve stability when navigating on uneven terrain.  Baseline: unable Goal status: INITIAL  4.  Patient will report pain at worst rated as </= 4/10 to reduce current functional limitations.  Baseline: 8 Goal status: INITIAL    PLAN:  PT FREQUENCY: 1-2x/week  PT DURATION: 8 weeks  PLANNED INTERVENTIONS: 97164- PT Re-evaluation, 97750- Physical Performance Testing, 97110-Therapeutic exercises, 97530- Therapeutic activity, [id number]- Neuromuscular  re-education, 97535- Self Care, [id number]- Manual therapy, [id number]- Gait training, [id number]- Aquatic Therapy, [phone]- Ionotophoresis 4mg /ml Dexamethasone, Balance training, Taping, Dry Needling, Cryotherapy, and Moist heat  PLAN FOR NEXT SESSION: review and progress HEP prn; progress pain free eversion/inversion strengthening; calf stretching; static balance activity. Hip abductor strengthening   Edinson Domeier P. Ina PT, MPH 05/22/24 3:27 PM

## 2024-05-23 ENCOUNTER — Encounter: Admitting: Rehabilitative and Restorative Service Providers"

## 2024-05-28 ENCOUNTER — Ambulatory Visit

## 2024-05-28 DIAGNOSIS — R293 Abnormal posture: Secondary | ICD-10-CM

## 2024-05-28 DIAGNOSIS — M6281 Muscle weakness (generalized): Secondary | ICD-10-CM

## 2024-05-28 DIAGNOSIS — R2689 Other abnormalities of gait and mobility: Secondary | ICD-10-CM

## 2024-05-28 DIAGNOSIS — M25572 Pain in left ankle and joints of left foot: Secondary | ICD-10-CM

## 2024-05-28 DIAGNOSIS — M25571 Pain in right ankle and joints of right foot: Secondary | ICD-10-CM

## 2024-05-28 NOTE — Therapy (Signed)
 OUTPATIENT PHYSICAL THERAPY LOWER EXTREMITY TREATMENT   Patient Name: Rebecca Lewis MRN: [id number] DOB:11/13/1946, 78 y.o., female Today's Date: 05/28/2024  END OF SESSION:  PT End of Session - 05/28/24 1401     Visit Number 9    Number of Visits 17    Date for PT Re-Evaluation 06/16/24    Authorization Type [employer]    PT Start Time 1401    PT Stop Time 1443    PT Time Calculation (min) 42 min    Activity Tolerance Patient tolerated treatment well              Past Medical History:  Diagnosis Date   Abnormal liver function test 08/12/2015   Arthritis    GERD (gastroesophageal reflux disease)    H/O measles    H/O mumps    History of chicken pox 05/18/2015   Hyperglycemia 05/12/2015   Hyperlipidemia    Kidney stones    Neck pain 05/12/2015   Overweight 08/12/2015   Pain in joint, shoulder region 05/12/2015   right   Screen for colon cancer 05/18/2015   TMJ (temporomandibular joint syndrome) 05/18/2015   UTI (lower urinary tract infection)    Past Surgical History:  Procedure Laterality Date   APPENDECTOMY  1966   arthrotomy and menisectomy Right 05/01/15   CARPAL TUNNEL RELEASE  09/15/04   CATARACT EXTRACTION Right 03/03/09   CATARACT EXTRACTION Left 03/17/09   ENDOSCOPIC PLANTAR FASCIOTOMY Right 06/21/05   epicondylitis  08/28/97   Left arm, tennis elbow release   EXTRACORPOREAL SHOCK WAVE LITHOTRIPSY Right 02/27/09   hammer toes Right 03/03/09   right foot 2 hammer toes corrected   LITHOTRIPSY Right 04/09/14   Neck fusion  6/14.07   C5, C6, C7   right ulna shortening Right 11/02/07   SEPTOPLASTY  03/11/10   SHOULDER SURGERY Right 12/09/10   TMJ ARTHROPLASTY Left 03/27/13   ULNAR SHORTENING WITH BONE GRAFT  08/28/02   Patient Active Problem List   Diagnosis Date Noted   Pseudophakia of both eyes 12/04/2020   Aphakia of right eye 11/15/2020   Thoracic facet syndrome 07/22/2020   Cavus deformity of foot, acquired 12/23/2019   Spinal stenosis of cervical region 08/16/2019    Sprain of ligaments of thoracic spine 08/16/2019   Intraocular lens dislocation, initial encounter 07/02/2019   Myalgia 05/17/2019   Visual disturbance 05/17/2019   S/P cervical spinal fusion 08/02/2018   Chronic left shoulder pain 05/23/2018   Spondylosis of cervical 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 myelopathy or radiculopathy 05/23/2018   Whiplash injury to neck 05/23/2018   Work related injury 05/23/2018   Hammertoe of right foot 05/09/2018   Alterations of sensations 05/05/2018   Degenerative cervical disc 05/05/2018   Sacroiliitis (HCC) 05/05/2018   Vertigo 05/05/2018   Vitamin D deficiency 05/05/2018   Prediabetes 01/27/2018   Nausea 12/30/2017   New daily persistent headache 12/30/2017   Chronic or recurrent subluxation of carpometacarpal joint of thumb, right 05/10/2017   DDD (degenerative disc disease), lumbar 01/14/2017   Lumbar facet arthropathy 01/14/2017   Spondylosis 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 01/14/2017   Sacroiliac joint pain 10/14/2016   Osteopenia 04/23/2016   Sprain of ankle 04/08/2016   Post-operative state 04/08/2016   Tendon tear 01/20/2016   Spells 10/14/2015   Overweight 08/12/2015   Abnormal liver function test 08/12/2015   History of chicken pox 05/18/2015   Screen for colon cancer 05/18/2015   TMJ (temporomandibular joint syndrome) 05/18/2015   H/O measles  Pain in joint, shoulder region 05/12/2015   Neck pain 05/12/2015   Hyperglycemia 05/12/2015   Arthritis    Hyperlipidemia    Kidney stones    GERD (gastroesophageal reflux disease)    Tendon tear, ankle, right, subsequent encounter 04/27/2015   Cramping of feet 05/07/2013   Brachial neuritis 10/19/2012   Essential tremor 10/19/2012   Left cervical radiculopathy 10/19/2012   Sprain and strain of shoulder and upper arm 03/02/2012   Sprain of left shoulder girdle 03/02/2012   Thoracic sprain and strain 03/02/2012   Left knee pain 11/03/2011    PCP: Clarance Joen HERO,  MD  REFERRING PROVIDER: Gershon Donnice SAUNDERS, DPM  REFERRING DIAG:  [phone] (ICD-10-CM) - Metatarsalgia of both feet  M77.9 (ICD-10-CM) - Tendonitis  M72.2 (ICD-10-CM) - Plantar fasciitis    THERAPY DIAG:  Pain in left ankle and joints of left foot  Pain in right ankle and joints of right foot  Muscle weakness (generalized)  Other abnormalities of gait and mobility  Abnormal posture  Rationale for Evaluation and Treatment: Rehabilitation  ONSET DATE: chronic   SUBJECTIVE:   SUBJECTIVE STATEMENT: Patient reports when she wears the compression socks her ankle feels better. She is having some swelling in the leg and feels like this aggravates the foot. She has plans to f/u with PA for the swelling. Walking does not bother the ankle, but standing does.   EVAL: Patient reports she has had a lot of changes to the orthotics she has been wearing to find the right fit. She was also found to have a leg length discrepancy with the RLE being shorter. The orthotists was able to add a little heel lift, which has made some difference. She has had 1 tendon repair on the LLE and 2 on the RLE per patient to the peroneal tendon. She is noticing that her foot is caving inward when she stands. At her most recent visit with Dr. Gershon she was told that the tendons have stretched on the Lt and recommended that she start with PT. The pain is mostly localized to the Lt lateral ankle. The Rt ankle/foot is doing ok with metatarsal pad, but it is very painful to be barefoot on the Rt foot.   PERTINENT HISTORY: Per patient 2 peroneal tendon repairs on RLE, 1 on the LLE  Rt plantar fasciotomy  Vertigo  PAIN:  Are you having pain? Yes: NPRS scale: 5 Pain location: left lateral ankle Pain description: tight tendon Aggravating factors: certain positions laying in recliner, heavy cleaning activities Relieving factors: rest and support   PRECAUTIONS: None   WEIGHT BEARING RESTRICTIONS: No  FALLS:   Has patient fallen in last 6 months? No  LIVING ENVIRONMENT: Lives with: lives alone Lives in: House/apartment Stairs: Yes: External: 3 steps; on left going up Has following equipment at home: None  OCCUPATION: retired    PATIENT GOALS: I want to find out why the ankle just continues to hurt and strengthen it.  NEXT MD VISIT: nothing scheduled   OBJECTIVE:  Note: Objective measures were completed at Evaluation unless otherwise noted.  DIAGNOSTIC FINDINGS: no recent imaging on file   PATIENT SURVEYS:  FADI: 63/100    SENSATION: Not tested  EDEMA:  No obvious swelling about the ankles    POSTURE: pes cavus   LOWER EXTREMITY ROM:  Active ROM Right eval Left eval 05/16/24   Hip flexion     Hip extension     Hip abduction     Hip adduction  Hip internal rotation     Hip external rotation     Knee flexion     Knee extension     Ankle dorsiflexion Lacking 2  Lacking 1  Lt: 4; Rt: 1  Ankle plantarflexion 72 74   Ankle inversion 16 16 pain WNL bilateral   Ankle eversion 12 11 WNL bilateral; pain Lt    (Blank rows = not tested)  LOWER EXTREMITY MMT:  MMT Right eval Left eval  Hip flexion    Hip extension    Hip abduction 4 4  Hip adduction    Hip internal rotation    Hip external rotation    Knee flexion    Knee extension    Ankle dorsiflexion 5 5  Ankle plantarflexion DL calf raise  DL calf raise pain   Ankle inversion 5 4 pain  Ankle eversion 5 4 pain   (Blank rows = not tested)  LOWER EXTREMITY SPECIAL TESTS:  None   FUNCTIONAL TESTS:  Tandem stance- unable   GAIT: Distance walked: 20 ft  Assistive device utilized: None Level of assistance: Complete Independence Comments: Rt foot ER, excessive frontal plane movement (with shoes donned as it is too painful for patient to walk barefoot)   [facility] Adult PT Treatment:                                                DATE: 05/28/24 Therapeutic Exercise: Ankle eversion yellow band 2 x  10 Resisted ankle plantarflexion blue band 2 x 10  Reviewed and updated HEP   Neuromuscular re-ed: Eccentric calf raise 2 x 10 (7 second set on LLE due to discomfort)  Tandem multiple trials- 12 seconds max Semi-tandem 2 x 30 sec each    [facility] Adult PT Treatment:                                                DATE: 05/22/24 Therapeutic Exercise: Nustep L6 x 6  Ankle AROM PF/DF Seated calf stretch x 30 sec x 2 each  Resisted plantarflexion green band x 10 R/L Isometric eversion/inversion x 10; 5 sec hold  Standing calf raise x 5 (discomfort L foot)   Neuromuscular re-ed: SLS L/R LE multiple reps (discomfort L - limited reps)  Standing hip abduction red band x 10 R/L  Standing hip extension red band x 10 R/L    [facility] Adult PT Treatment:                                                DATE: 05/16/24 Therapeutic Exercise: Ankle AROM  Seated calf stretch x 30 sec each  Resisted plantarflexion green band x 10 each Isometric eversion/inversion x 10; 5 sec hold  Standing calf raise 2 x 10   Neuromuscular re-ed: Great toe extension x 10 each  SLS LLE multiple reps  Standing hip abduction red band x 10 each Standing hip extension red band x 10 each     [facility] Adult PT Treatment:  DATE: 05/09/24 Therapeutic Exercise: Calf stretch x 1 minute  Standing calf raise 2 x 10  Standing heel raise 2 x 10  Resisted ankle inversion red band 2 x 10   Neuromuscular re-ed: Standing hip abduction 2 x 10 red band Standing hip extension 2 x 10 red band  Semi-tandem 2 x 30 sec each  Tandem multiple trials 5-10 seconds each  Standing march 2 x 10     PATIENT EDUCATION:  Education details: HEP update Person educated: Patient Education method: Explanation, demo, cues, handout Education comprehension: verbalized understanding, returned demo, cues   HOME EXERCISE PROGRAM: Access Code: [email or web address] URL: [email or web address] Date:  05/28/2024 Prepared by: Lucie Meeter  Exercises - Long Sitting Calf Stretch with Strap  - 2 x daily - 7 x weekly - 3 sets - 30 sec  hold - Supine Piriformis Stretch with Leg Straight  - 1 x daily - 7 x weekly - 1 sets - 3 reps - 30 sec  hold - Standing Hip Abduction with Counter Support  - 1 x daily - 7 x weekly - 2-3 sets - 10 reps - 2-3 sec  hold - Standing Single Leg Stance with Counter Support  - 1 x daily - 7 x weekly - 1 sets - 3 reps - 10-20 sec  hold - Standing Heel Raise  - 1 x daily - 7 x weekly - 2 sets - 10 reps - Sidelying Hip Abduction  - 1 x daily - 7 x weekly - 2 sets - 10 reps - Seated Great Toe Extension  - 1 x daily - 7 x weekly - 2 sets - 10 reps - Standing Hip Abduction with Resistance at Ankles and Counter Support  - 1 x daily - 7 x weekly - 2 sets - 10 reps - Standing Hip Extension with Resistance at Ankles and Counter Support  - 1 x daily - 7 x weekly - 2 sets - 10 reps - Seated Ankle Inversion with Resistance and Legs Crossed  - 1 x daily - 7 x weekly - 2 sets - 10 reps - Long Sitting Ankle Eversion with Resistance  - 1 x daily - 7 x weekly - 2 sets - 10 reps - Seated Ankle Plantarflexion with Resistance  - 1 x daily - 7 x weekly - 2 sets - 10 reps  ASSESSMENT:  CLINICAL IMPRESSION: Continued with progression of ankle strengthening and balance activity with fairly good tolerance. She reported soreness towards end of second set with eccentric calf raise on the LLE, so discontinued after 7 reps. She demonstrates improved balance with ability to maintain tandem for 12 seconds bilaterally, having met this LTG. She was able to complete resisted ankle eversion without an increase in ankle pain.     EVAL: Patient is a 78 y.o. female who was seen today for physical therapy evaluation and treatment for metatarsalgia, tendonitis, and plantar fascitis. She reports history of chronic bilateral foot/ankle pain stating that she has undergone 2 peroneal tendon repairs on the RLE and  1 on the LLE. Her biggest complaint currently is Lt lateral ankle pain. Upon assessment she is noted to have significant pes cavus bilaterally, Lt ankle weakness with pain provoked with inversion,eversion, and plantarflexor MMT, balance impairments, and gait abnormalities. She will benefit from skilled PT to address the above stated deficits in order to optimize her function and assist in overall pain reduction.   OBJECTIVE IMPAIRMENTS: Abnormal gait, decreased activity tolerance, decreased balance, decreased endurance, difficulty walking, decreased ROM,  decreased strength, impaired flexibility, improper body mechanics, postural dysfunction, and pain.    GOALS: Goals reviewed with patient? Yes  SHORT TERM GOALS: Target date: 05/16/2024   Patient will be independent and compliant with initial HEP.   Baseline: issued at eval Goal status: MET  2.  Patient will maintain stance on unstable surface for at least 10 seconds to improve stability when on uneven terrain.  Baseline: unable to maintain tandem stance  05/07/24: romberg on foam x 30 sec  Goal status: MET  3.  Patient will improve bilateral ankle DF AROM by at least 5 degrees to improve gait mechanics.  Baseline: see above  Goal status: partially met    LONG TERM GOALS: Target date: 06/16/24  Patient will score >/= 70 on the Foot and Ankle Disability Index (MDC 7) to signify clinically meaningful improvement in functional abilities.   Baseline: see above Goal status: INITIAL  2.  Patient will demonstrate 5/5 Lt ankle inversion/eversion strength to improve gait stability.  Baseline: see above Goal status: INITIAL  3.  Patient will maintain tandem stance for at least 10 seconds to improve stability when navigating on uneven terrain.  Baseline: unable Goal status: MET  4.  Patient will report pain at worst rated as </= 4/10 to reduce current functional limitations.  Baseline: 8 Goal status: INITIAL    PLAN:  PT FREQUENCY:  1-2x/week  PT DURATION: 8 weeks  PLANNED INTERVENTIONS: 97164- PT Re-evaluation, 97750- Physical Performance Testing, 97110-Therapeutic exercises, 97530- Therapeutic activity, [id number]- Neuromuscular re-education, 97535- Self Care, [id number]- Manual therapy, [id number]- Gait training, [phone]- Aquatic Therapy, [phone]- Ionotophoresis 4mg /ml Dexamethasone, Balance training, Taping, Dry Needling, Cryotherapy, and Moist heat  PLAN FOR NEXT SESSION: review and progress HEP prn; progress pain free eversion/inversion strengthening; calf stretching; static balance activity. Hip abductor strengthening; progress note, potential d/c   Lucie Meeter, PT, DPT, ATC 05/28/24 2:44 PM

## 2024-05-30 ENCOUNTER — Ambulatory Visit: Attending: Podiatry

## 2024-05-30 DIAGNOSIS — R2689 Other abnormalities of gait and mobility: Secondary | ICD-10-CM | POA: Insufficient documentation

## 2024-05-30 DIAGNOSIS — M6281 Muscle weakness (generalized): Secondary | ICD-10-CM | POA: Diagnosis present

## 2024-05-30 DIAGNOSIS — M25571 Pain in right ankle and joints of right foot: Secondary | ICD-10-CM | POA: Insufficient documentation

## 2024-05-30 DIAGNOSIS — M25572 Pain in left ankle and joints of left foot: Secondary | ICD-10-CM | POA: Diagnosis present

## 2024-05-30 DIAGNOSIS — R293 Abnormal posture: Secondary | ICD-10-CM | POA: Diagnosis present

## 2024-05-30 NOTE — Therapy (Signed)
 OUTPATIENT PHYSICAL THERAPY LOWER EXTREMITY TREATMENT  Progress Note Reporting Period 04/18/24 to 05/30/24  See note below for Objective Data and Assessment of Progress/Goals.    PHYSICAL THERAPY DISCHARGE SUMMARY  Visits from Start of Care: 10  Current functional level related to goals / functional outcomes: See goals below   Remaining deficits: See impression below   Education / Equipment: See education below    Patient agrees to discharge. Patient goals were mostly met. Patient is being discharged due to independent with HEP.    Patient Name: Rebecca Lewis MRN: [id number] DOB:Apr 04, 1946, 78 y.o., female, female Today's Date: 05/30/2024  END OF SESSION:  PT End of Session - 05/30/24 1314     Visit Number 10    Number of Visits 17    Date for PT Re-Evaluation 06/16/24    Authorization Type [employer]    PT Start Time 1314    PT Stop Time 1352    PT Time Calculation (min) 38 min    Activity Tolerance Patient tolerated treatment well               Past Medical History:  Diagnosis Date   Abnormal liver function test 08/12/2015   Arthritis    GERD (gastroesophageal reflux disease)    H/O measles    H/O mumps    History of chicken pox 05/18/2015   Hyperglycemia 05/12/2015   Hyperlipidemia    Kidney stones    Neck pain 05/12/2015   Overweight 08/12/2015   Pain in joint, shoulder region 05/12/2015   right   Screen for colon cancer 05/18/2015   TMJ (temporomandibular joint syndrome) 05/18/2015   UTI (lower urinary tract infection)    Past Surgical History:  Procedure Laterality Date   APPENDECTOMY  1966   arthrotomy and menisectomy Right 05/01/15   CARPAL TUNNEL RELEASE  09/15/04   CATARACT EXTRACTION Right 03/03/09   CATARACT EXTRACTION Left 03/17/09   ENDOSCOPIC PLANTAR FASCIOTOMY Right 06/21/05   epicondylitis  08/28/97   Left arm, tennis elbow release   EXTRACORPOREAL SHOCK WAVE LITHOTRIPSY Right 02/27/09   hammer toes Right 03/03/09   right foot 2 hammer toes corrected    LITHOTRIPSY Right 04/09/14   Neck fusion  6/14.07   C5, C6, C7   right ulna shortening Right 11/02/07   SEPTOPLASTY  03/11/10   SHOULDER SURGERY Right 12/09/10   TMJ ARTHROPLASTY Left 03/27/13   ULNAR SHORTENING WITH BONE GRAFT  08/28/02   Patient Active Problem List   Diagnosis Date Noted   Pseudophakia of both eyes 12/04/2020   Aphakia of right eye 11/15/2020   Thoracic facet syndrome 07/22/2020   Cavus deformity of foot, acquired 12/23/2019   Spinal stenosis of cervical region 08/16/2019   Sprain of ligaments of thoracic spine 08/16/2019   Intraocular lens dislocation, initial encounter 07/02/2019   Myalgia 05/17/2019   Visual disturbance 05/17/2019   S/P cervical spinal fusion 08/02/2018   Chronic left shoulder pain 05/23/2018   Spondylosis of cervical 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 myelopathy or radiculopathy 05/23/2018   Whiplash injury to neck 05/23/2018   Work related injury 05/23/2018   Hammertoe of right foot 05/09/2018   Alterations of sensations 05/05/2018   Degenerative cervical disc 05/05/2018   Sacroiliitis (HCC) 05/05/2018   Vertigo 05/05/2018   Vitamin D deficiency 05/05/2018   Prediabetes 01/27/2018   Nausea 12/30/2017   New daily persistent headache 12/30/2017   Chronic or recurrent subluxation of carpometacarpal joint of thumb, right 05/10/2017   DDD (degenerative disc disease), lumbar 01/14/2017  Lumbar facet arthropathy 01/14/2017   Spondylosis 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 01/14/2017   Sacroiliac joint pain 10/14/2016   Osteopenia 04/23/2016   Sprain of ankle 04/08/2016   Post-operative state 04/08/2016   Tendon tear 01/20/2016   Spells 10/14/2015   Overweight 08/12/2015   Abnormal liver function test 08/12/2015   History of chicken pox 05/18/2015   Screen for colon cancer 05/18/2015   TMJ (temporomandibular joint syndrome) 05/18/2015   H/O measles    Pain in joint, shoulder region 05/12/2015   Neck pain 05/12/2015   Hyperglycemia  05/12/2015   Arthritis    Hyperlipidemia    Kidney stones    GERD (gastroesophageal reflux disease)    Tendon tear, ankle, right, subsequent encounter 04/27/2015   Cramping of feet 05/07/2013   Brachial neuritis 10/19/2012   Essential tremor 10/19/2012   Left cervical radiculopathy 10/19/2012   Sprain and strain of shoulder and upper arm 03/02/2012   Sprain of left shoulder girdle 03/02/2012   Thoracic sprain and strain 03/02/2012   Left knee pain 11/03/2011    PCP: Clarance Joen HERO, MD  REFERRING PROVIDER: Gershon Donnice SAUNDERS, DPM  REFERRING DIAG:  [phone] (ICD-10-CM) - Metatarsalgia of both feet  M77.9 (ICD-10-CM) - Tendonitis  M72.2 (ICD-10-CM) - Plantar fasciitis    THERAPY DIAG:  Pain in left ankle and joints of left foot  Pain in right ankle and joints of right foot  Muscle weakness (generalized)  Other abnormalities of gait and mobility  Abnormal posture  Rationale for Evaluation and Treatment: Rehabilitation  ONSET DATE: chronic   SUBJECTIVE:   SUBJECTIVE STATEMENT: Patient reports she walked this morning and did her exercises. States it is doing good. Notices the pain more after she sits for awhile.   EVAL: Patient reports she has had a lot of changes to the orthotics she has been wearing to find the right fit. She was also found to have a leg length discrepancy with the RLE being shorter. The orthotists was able to add a little heel lift, which has made some difference. She has had 1 tendon repair on the LLE and 2 on the RLE per patient to the peroneal tendon. She is noticing that her foot is caving inward when she stands. At her most recent visit with Dr. Gershon she was told that the tendons have stretched on the Lt and recommended that she start with PT. The pain is mostly localized to the Lt lateral ankle. The Rt ankle/foot is doing ok with metatarsal pad, but it is very painful to be barefoot on the Rt foot.   PERTINENT HISTORY: Per patient 2  peroneal tendon repairs on RLE, 1 on the LLE  Rt plantar fasciotomy  Vertigo  PAIN:  Are you having pain? Yes: NPRS scale: 5 Pain location: left lateral ankle Pain description: tightness  Aggravating factors: certain positions laying in recliner, heavy cleaning activities Relieving factors: rest and support   PRECAUTIONS: None   WEIGHT BEARING RESTRICTIONS: No  FALLS:  Has patient fallen in last 6 months? No  LIVING ENVIRONMENT: Lives with: lives alone Lives in: House/apartment Stairs: Yes: External: 3 steps; on left going up Has following equipment at home: None  OCCUPATION: retired    PATIENT GOALS: I want to find out why the ankle just continues to hurt and strengthen it.  NEXT MD VISIT: nothing scheduled   OBJECTIVE:  Note: Objective measures were completed at Evaluation unless otherwise noted.  DIAGNOSTIC FINDINGS: no recent imaging on file  PATIENT SURVEYS:  FADI: 63/100  05/30/24: FADI: 92/100   SENSATION: Not tested  EDEMA:  No obvious swelling about the ankles    POSTURE: pes cavus   LOWER EXTREMITY ROM:  Active ROM Right eval Left eval 05/16/24  05/30/24  Hip flexion      Hip extension      Hip abduction      Hip adduction      Hip internal rotation      Hip external rotation      Knee flexion      Knee extension      Ankle dorsiflexion Lacking 2  Lacking 1  Lt: 4; Rt: 1 Lt: 5; Rt: 4  Ankle plantarflexion 72 74    Ankle inversion 16 16 pain WNL bilateral    Ankle eversion 12 11 WNL bilateral; pain Lt     (Blank rows = not tested)  LOWER EXTREMITY MMT:  MMT Right eval Left eval 05/30/24 Left    Hip flexion     Hip extension     Hip abduction 4 4   Hip adduction     Hip internal rotation     Hip external rotation     Knee flexion     Knee extension     Ankle dorsiflexion 5 5 5   Ankle plantarflexion DL calf raise  DL calf raise pain    Ankle inversion 5 4 pain 5  Ankle eversion 5 4 pain 5   (Blank rows = not  tested)  LOWER EXTREMITY SPECIAL TESTS:  None   FUNCTIONAL TESTS:  Tandem stance- unable   GAIT: Distance walked: 20 ft  Assistive device utilized: None Level of assistance: Complete Independence Comments: Rt foot ER, excessive frontal plane movement (with shoes donned as it is too painful for patient to walk barefoot)  [facility] Adult PT Treatment:                                                DATE: 05/30/24 Therapeutic Exercise: Reviewed and updated HEP performing as needed and discussing frequency, sets, reps, and ways to progress independently.   Therapeutic Activity: Re-assessment to determine overall progress, educating patient on progress towards goals.    [facility] Adult PT Treatment:                                                DATE: 05/28/24 Therapeutic Exercise: Ankle eversion yellow band 2 x 10 Resisted ankle plantarflexion blue band 2 x 10  Reviewed and updated HEP   Neuromuscular re-ed: Eccentric calf raise 2 x 10 (7 second set on LLE due to discomfort)  Tandem multiple trials- 12 seconds max Semi-tandem 2 x 30 sec each    [facility] Adult PT Treatment:                                                DATE: 05/22/24 Therapeutic Exercise: Nustep L6 x 6  Ankle AROM PF/DF Seated calf stretch x 30 sec x 2 each  Resisted plantarflexion green band x 10 R/L Isometric eversion/inversion x 10; 5 sec hold  Standing calf raise x 5 (discomfort L foot)   Neuromuscular re-ed: SLS L/R LE multiple reps (discomfort L - limited reps)  Standing hip abduction red band x 10 R/L  Standing hip extension red band x 10 R/L    [facility] Adult PT Treatment:                                                DATE: 05/16/24 Therapeutic Exercise: Ankle AROM  Seated calf stretch x 30 sec each  Resisted plantarflexion green band x 10 each Isometric eversion/inversion x 10; 5 sec hold  Standing calf raise 2 x 10   Neuromuscular re-ed: Great toe extension x 10 each  SLS LLE multiple reps  Standing hip  abduction red band x 10 each Standing hip extension red band x 10 each       PATIENT EDUCATION:  Education details: HEP update; d/c education  Person educated: Patient Education method: Explanation, demo, handout Education comprehension: verbalized understanding, returned demo,   HOME EXERCISE PROGRAM: Access Code: [email or web address] URL: [email or web address] Date: 05/30/2024 Prepared by: Lucie Meeter  Exercises - Long Sitting Calf Stretch with Strap  - 1 x daily - 7 x weekly - 3 sets - 30 sec  hold - Supine Piriformis Stretch with Leg Straight  - 1 x daily - 7 x weekly - 1 sets - 3 reps - 30 sec  hold - Standing Single Leg Stance with Counter Support  - 1 x daily - 7 x weekly - 1 sets - 3 reps - 10-20 sec  hold - Tandem Stance  - 1 x daily - 7 x weekly - 3 sets - 30 sec  hold - Sidelying Hip Abduction  - 1 x daily - 3 x weekly - 2 sets - 10 reps - Standing Heel Raise  - 1 x daily - 3 x weekly - 2 sets - 10 reps - Seated Great Toe Extension  - 1 x daily - 3 x weekly - 2 sets - 10 reps - Standing Hip Abduction with Resistance at Ankles and Counter Support  - 1 x daily - 3 x weekly - 2 sets - 10 reps - Standing Hip Extension with Resistance at Ankles and Counter Support  - 1 x daily - 3 x weekly - 2 sets - 10 reps - Seated Ankle Inversion with Resistance and Legs Crossed  - 1 x daily - 3 x weekly - 2 sets - 10 reps - Long Sitting Ankle Eversion with Resistance  - 1 x daily - 3 x weekly - 2 sets - 10 reps - Seated Ankle Plantarflexion with Resistance  - 1 x daily - 3 x weekly - 2 sets - 10 reps  ASSESSMENT:  CLINICAL IMPRESSION: Rebecca Lewis has completed 10 PT visits for her chronic bilateral foot/ankle pain. She demonstrates overall improvements in ankle strength, ROM, and balance since the start of care. She has met the majority of established functional goals, with exception of pain goal (rating pain at worst as 5). She demonstrates independence with advanced home program that was  issued today and is appropriate for discharge at this time with patient in agreement with this plan.     EVAL: Patient is a 78 y.o. female who was seen today for physical therapy evaluation and treatment for metatarsalgia, tendonitis, and plantar fascitis. She reports history of chronic bilateral foot/ankle  pain stating that she has undergone 2 peroneal tendon repairs on the RLE and 1 on the LLE. Her biggest complaint currently is Lt lateral ankle pain. Upon assessment she is noted to have significant pes cavus bilaterally, Lt ankle weakness with pain provoked with inversion,eversion, and plantarflexor MMT, balance impairments, and gait abnormalities. She will benefit from skilled PT to address the above stated deficits in order to optimize her function and assist in overall pain reduction.   OBJECTIVE IMPAIRMENTS: Abnormal gait, decreased activity tolerance, decreased balance, decreased endurance, difficulty walking, decreased ROM, decreased strength, impaired flexibility, improper body mechanics, postural dysfunction, and pain.    GOALS: Goals reviewed with patient? Yes  SHORT TERM GOALS: Target date: 05/16/2024   Patient will be independent and compliant with initial HEP.   Baseline: issued at eval Goal status: MET  2.  Patient will maintain stance on unstable surface for at least 10 seconds to improve stability when on uneven terrain.  Baseline: unable to maintain tandem stance  05/07/24: romberg on foam x 30 sec  Goal status: MET  3.  Patient will improve bilateral ankle DF AROM by at least 5 degrees to improve gait mechanics.  Baseline: see above  Goal status: MET   LONG TERM GOALS: Target date: 06/16/24  Patient will score >/= 70 on the Foot and Ankle Disability Index (MDC 7) to signify clinically meaningful improvement in functional abilities.   Baseline: see above Goal status: MET  2.  Patient will demonstrate 5/5 Lt ankle inversion/eversion strength to improve gait  stability.  Baseline: see above Goal status: MET  3.  Patient will maintain tandem stance for at least 10 seconds to improve stability when navigating on uneven terrain.  Baseline: unable 05/28/24: 12 seconds  Goal status: MET  4.  Patient will report pain at worst rated as </= 4/10 to reduce current functional limitations.  Baseline: 8 05/30/24: at worst 4-5  Goal status: partially met     PLAN:  PT FREQUENCY: 1-2x/week  PT DURATION: 8 weeks  PLANNED INTERVENTIONS: 97164- PT Re-evaluation, 97750- Physical Performance Testing, 97110-Therapeutic exercises, 97530- Therapeutic activity, [id number]- Neuromuscular re-education, 97535- Self Care, [id number]- Manual therapy, [id number]- Gait training, [phone]- Aquatic Therapy, [phone]- Ionotophoresis 4mg /ml Dexamethasone, Balance training, Taping, Dry Needling, Cryotherapy, and Moist heat    Lucie Meeter, PT, DPT, ATC 05/30/24 3:10 PM

## 2024-06-12 ENCOUNTER — Telehealth: Payer: Self-pay

## 2024-06-12 ENCOUNTER — Other Ambulatory Visit: Payer: Self-pay | Admitting: Surgery

## 2024-06-12 DIAGNOSIS — I872 Venous insufficiency (chronic) (peripheral): Secondary | ICD-10-CM

## 2024-06-12 NOTE — Telephone Encounter (Signed)
 Patient called c/o bilateral leg swelling increasing over the last several month. Advised patient to use compression stockings and elevate legs above her heart.  Appt made for venous reflux and f/u w/PA.

## 2024-07-26 NOTE — Progress Notes (Signed)
 History of the Present Illness:  Rebecca Lewis is an 78 y.o. female who presents in follow-up regarding a history of work related R>L axial neck with radiation to the R scapular region as well as non-worked related B buttock pain.   Specifically, she comes in today s/p B SI joint injections (07/04/24) reporting significant improvement of the B buttock pain. She currently reports improvement of pain in this region.  Currently, her cervical symptoms have remained well managed following injection in the cervical injection in 11/2023.   Please see prior documentation for work related neck symptoms and treatment.   Notable Procedures: B SI joint injections (07/04/24)  R C4-C5 TF ESI (05/17/23 & 12/02/23)  R C5-C6 TF ESI in early in treatment (3 total) with Dr. Franklin (2002) Thoracic ESIs (right sided) as well as B T4-T5 & T5-T6 RFA  C5-C7 cervical fusion with Dr. Carolee on 05/12/2006 TMJ surgery with Dr. Jackee approximately 6 years ago Multiple lumbar related procedures with Dr. Correne   Pain level currently: 4/10 only during activity   Imaging/Data:   CT cervical spine: 03/24/2023  One or more of the following techniques were utilized for dose reduction:  -   Automated exposure control.  -   Adjustment of the mA or kV according to patient size.  -   Use of iterative image reconstruction technique   FINDINGS:  ACDF changes at C5-C7. The cervical vertebral body heights appear preserved. 1.4 mm anterolisthesis at C4-C5. 2 mm anterolisthesis at C7-T1. Levocurvature of the visualized cervicothoracic spine. No destructive osseous change or acute fracture line demonstrated. ACDF hardware appears intact. No evidence of abnormal periprosthetic lucency. There is osseous fusion of the C5-C6 and C6-C7 disc spaces. No acute paraspinal soft tissue abnormality. Vascular calcifications noted.   #  Please note a limited evaluation of the spinal canal without intrathecal contrast material.   #  C2-C3: Partial bony  ankylosis of the right facet. Bony ankylosis of the left facet. No significant stenosis.  #  C3-C4: Bony ankylosis of the right-sided facet joint. Left-sided facet degenerative change. No significant stenosis.  #  C4-C5: Severe right and moderate left facet degenerative change. Anterolisthesis. No significant central canal or left foraminal stenosis. Approximate mild to moderate right foraminal stenosis.  #  C5-C6: ACDF changes. Ankylosis of the right-sided facet joint. No significant stenosis demonstrated.  #  C6-C7: ACDF changes. No significant stenosis.  #  C7-T1: Right-sided facet degenerative change. Slight anterolisthesis. No significant central canal or left foraminal stenosis. Mild right foraminal stenosis.   IMPRESSION:  1. ACDF changes at C5-C7. The hardware appears intact. There is osseous fusion of the C5-C6 and C6-C7 disc spaces. 2.  Severe right and moderate left facet degenerative change with slight anterolisthesis at C4-C5. There may be a small amount of bony ankylosis across the right-sided facet joint. Approximately mild to moderate right foraminal stenosis.  3.  Right-sided facet joint change and slight anterolisthesis at C7-T1. Approximately mild right foraminal stenosis.  4.  Several levels of facet joint bony ankylosis as discussed in detail above.  5.  Levocurvature of the visualized cervicothoracic spine.  Electronically Signed by: Ozell Rio, MD on 03/24/2023    Assessment: Non DOL/work related B buttock pain which is consistent with B sacroiliitis. Work related, chronic, continued right sided neck pain with radiation to the R scapular region; cervical radiculitis. Avoid NSAIDs due to potential cardiac issues   Plan: We will plan to perform B SI joint injection (own insurance) as  well as R C4-C5 TF ESI as needed (under DOL) Continue Tylenol  prn  Continue HEP learned during recent PT course Continue Tumeric and Omega 3 fatty acid supplements Continue lidocaine   patches Paperwork provided to fill out and return RTC prn

## 2024-08-03 ENCOUNTER — Other Ambulatory Visit: Payer: Self-pay | Admitting: Surgery

## 2024-08-03 DIAGNOSIS — I872 Venous insufficiency (chronic) (peripheral): Secondary | ICD-10-CM

## 2024-09-03 ENCOUNTER — Ambulatory Visit (HOSPITAL_COMMUNITY)
Admission: RE | Admit: 2024-09-03 | Discharge: 2024-09-03 | Disposition: A | Discharge status: Home / Self Care | Source: Ambulatory Visit | Attending: Surgery | Admitting: Surgery

## 2024-09-03 ENCOUNTER — Ambulatory Visit: Admitting: Physician Assistant

## 2024-09-03 ENCOUNTER — Encounter: Payer: Self-pay | Admitting: Physician Assistant

## 2024-09-03 DIAGNOSIS — I872 Venous insufficiency (chronic) (peripheral): Secondary | ICD-10-CM | POA: Diagnosis present

## 2024-09-03 NOTE — Progress Notes (Signed)
 [facility]           [facility] [place]  History and Physical   Rebecca Lewis is a 78 y.o. female who presents with leg swelling  She was seen in 2023 with c/o swelling in the left leg.  She was not having any open wounds or weeping.  She did not have DVT and was not a candidate for laser ablation.   She returns today for evaluation.  She continues to have leg swelling with left worse than right.  She does not have any hx of blood clots in her legs.  She does have some family hx of leg swelling with her mom.  She states that her brother had some swelling and weeping from his lower legs as well.  She has used compression but does not wear them every day.  She states she does wear them if she is going on a long road trip or sitting for long periods of time.  She states that sitting for long periods of time make her leg swelling worse.   She states she did used to elevate her legs with the lounge doctor, however, she started having some pain in her back and therefore had to stop elevating.  She states she has changed her mattress and is going to try elevating again.  She states that she has a goal of walking about 4 miles every morning.  She states that over the past several months, her legs and feet have felt more cold.    She states she has hx of diverticulitis but has been having some spasms/abdominal pain.  She denies any post prandial pain and actually feels better after she eats.    The pt is on a statin for cholesterol management.  The pt is not on a daily aspirin.   Other AC:  none The pt is on BB for hypertension.   The pt is not on medication for diabetes.   Tobacco hx:  former  Pt does not have family hx of AAA.  Past Medical History:  Diagnosis Date   Abnormal liver function test 08/12/2015   Arthritis    GERD (gastroesophageal reflux disease)    H/O measles    H/O mumps    History of chicken pox 05/18/2015   Hyperglycemia 05/12/2015   Hyperlipidemia     Kidney stones    Neck pain 05/12/2015   Overweight 08/12/2015   Pain in joint, shoulder region 05/12/2015   right   Screen for colon cancer 05/18/2015   TMJ (temporomandibular joint syndrome) 05/18/2015   UTI (lower urinary tract infection)     Past Surgical History:  Procedure Laterality Date   APPENDECTOMY  1966   arthrotomy and menisectomy Right 05/01/15   CARPAL TUNNEL RELEASE  09/15/04   CATARACT EXTRACTION Right 03/03/09   CATARACT EXTRACTION Left 03/17/09   ENDOSCOPIC PLANTAR FASCIOTOMY Right 06/21/05   epicondylitis  08/28/97   Left arm, tennis elbow release   EXTRACORPOREAL SHOCK WAVE LITHOTRIPSY Right 02/27/09   hammer toes Right 03/03/09   right foot 2 hammer toes corrected   LITHOTRIPSY Right 04/09/14   Neck fusion  6/14.07   C5, C6, C7   right ulna shortening Right 11/02/07   SEPTOPLASTY  03/11/10   SHOULDER SURGERY Right 12/09/10   TMJ ARTHROPLASTY Left 03/27/13   ULNAR SHORTENING WITH BONE GRAFT  08/28/02    Social History   Socioeconomic History   Marital status: Divorced  Spouse name: Not on file   Number of children: 2   Years of education: Not on file   Highest education level: Not on file  Occupational History   Occupation: retired  Tobacco Use   Smoking status: Former   Smokeless tobacco: Never   Tobacco comments:    quit in 1977  Substance and Sexual Activity   Alcohol use: No    Alcohol/week: 0.0 standard drinks of alcohol   Drug use: No   Sexual activity: Yes    Birth control/protection: None    Comment: lives alon, no dietary restrictions  Other Topics Concern   Not on file  Social History Narrative   Not on file   Social Drivers of Health   Financial Resource Strain: Low Risk  (01/31/2024)   Received from [facility]   Overall Financial Resource Strain (CARDIA)    Difficulty of Paying Living Expenses: Not very hard  Food Insecurity: No Food Insecurity (01/31/2024)   Received from [facility]   Hunger Vital Sign    Within the past 12 months,  you worried that your food would run out before you got the money to buy more.: Never true    Within the past 12 months, the food you bought just didn't last and you didn't have money to get more.: Never true  Transportation Needs: No Transportation Needs (01/31/2024)   Received from [facility] - Transportation    Lack of Transportation (Medical): No    Lack of Transportation (Non-Medical): No  Physical Activity: Sufficiently Active (01/31/2024)   Received from [facility]   Exercise Vital Sign    On average, how many days per week do you engage in moderate to strenuous exercise (like a brisk walk)?: 5 days    On average, how many minutes do you engage in exercise at this level?: 90 min  Stress: Patient Declined (03/22/2022)   Received from [facility] - Occupational Stress Questionnaire    Feeling of Stress : Patient declined  Social Connections: Moderately Integrated (01/31/2024)   Received from [facility]   Social Network    How would you rate your social network (family, work, friends)?: Adequate participation with social networks  Intimate Partner Violence: Patient Declined (01/31/2024)   Received from [facility]   HITS    Over the last 12 months how often did your partner physically hurt you?: Patient declined    Over the last 12 months how often did your partner insult you or talk down to you?: Patient declined    Over the last 12 months how often did your partner threaten you with physical harm?: Patient declined    Over the last 12 months how often did your partner scream or curse at you?: Patient declined     Family History  Problem Relation Age of Onset   Cancer Mother        lymphoma, melanoma   Heart disease Father 36       MI, arteriothrombosis   Cancer Sister        lung?   Heart disease Brother    Heart disease Son    Heart disease Paternal Uncle    Alcohol abuse Maternal Grandfather    Heart disease  Maternal Grandfather    Dementia Paternal Grandmother    Dementia Paternal Grandfather     Current Outpatient Medications  Medication Sig Dispense Refill   Bromfenac Sodium (PROLENSA) 0.07 % SOLN Apply to  eye.     bupivacaine (MARCAINE) 0.25 % injection by Epidural route.     calcium  elemental as carbonate (BARIATRIC TUMS ULTRA) 400 MG chewable tablet Chew by mouth.     Cholecalciferol 50 MCG (2000 UT) TBDP Take 1 tablet by mouth daily.     Chromium Picolinate 500 MCG CAPS Take 1,000 mcg by mouth daily.     diclofenac  Sodium (VOLTAREN ) 1 % GEL Apply 2 g topically 4 (four) times daily. Rub into affected area of foot 2 to 4 times daily 100 g 2   dorzolamide (TRUSOPT) 2 % ophthalmic solution Apply to eye.     Ferrous Sulfate (IRON) 325 (65 FE) MG TABS Take 1 tablet by mouth every 3 (three) days.     Flaxseed, Linseed, (FLAXSEED OIL) 1000 MG CAPS Take 1 tablet by mouth daily.     ibuprofen  (ADVIL ) 800 MG tablet Take 1 tablet (800 mg total) by mouth every 8 (eight) hours as needed. 30 tablet 0   L-Methylfolate-B6-B12 (FOLTX) 1.13-25-2 MG TABS TAKE 1 TABLET EVERY THIRD DAY     lidocaine  (LIDODERM ) 5 % Place 1 patch onto the skin daily. 30 patch 1   Magnesium Citrate 100 MG TABS Take 200 mg by mouth 2 (two) times daily.     meloxicam  (MOBIC ) 15 MG tablet Take 15 mg by mouth daily.     meloxicam  (MOBIC ) 7.5 MG tablet Take 7.5 mg by mouth daily.     metoprolol  tartrate (LOPRESSOR ) 100 MG tablet Take 2 hours prior to CT scan 1 tablet 0   Omega-3 Fatty Acids (FISH OIL) 1000 MG CAPS Take 950 mg by mouth daily.     Potassium 99 MG TABS Take 1 tablet by mouth daily.     rosuvastatin  (CRESTOR ) 10 MG tablet Take by mouth.      rosuvastatin  (CRESTOR ) 20 MG tablet Take 1 tablet (20 mg total) by mouth daily. 90 tablet 3   Turmeric 450 MG CAPS Take by mouth.     UNABLE TO FIND      UNABLE TO FIND Take by mouth.     UNABLE TO FIND Take by mouth.     vitamin B-12 (CYANOCOBALAMIN) 1000 MCG tablet Take 1,000  mcg by mouth every 3 (three) days.     ZIOPTAN 0.0015 % SOLN      No current facility-administered medications for this visit.    Allergies  Allergen Reactions   Vicodin [Hydrocodone-Acetaminophen ] Nausea And Vomiting    headache Headache  headache   Wasp Venom Swelling    No anaphylaxis No anaphylaxis    Aleve [Naproxen Sodium]     Pain across Forehead Pain across Forehead   Bee Venom Other (See Comments)    Other reaction(s): Other (See Comments) Other reaction(s): Unknown Other reaction(s): Unknown   Ibuprofen      Pain across forhead Pain across forhead   Poison Ivy Extract [Poison Ivy Extract]    Pregabalin     Other reaction(s): Other (See Comments) Weight gain, depression.   Tramadol     Other reaction(s): Other (See Comments)    REVIEW OF SYSTEMS:   [X]  denotes positive finding, [ ]  denotes negative finding Cardiac  Comments:  Chest pain or chest pressure:    Shortness of breath upon exertion:    Short of breath when lying flat:    Irregular heart rhythm:        Vascular    Pain in calf, thigh, or hip brought on by ambulation:    Pain  in feet at night that wakes you up from your sleep:     Blood clot in your veins:    Leg swelling:  x       Pulmonary    Oxygen at home:    Productive cough:     Wheezing:         Neurologic    Sudden weakness in arms or legs:     Sudden numbness in arms or legs:     Sudden onset of difficulty speaking or slurred speech:    Temporary loss of vision in one eye:     Problems with dizziness:         Gastrointestinal    Blood in stool:     Vomited blood:         Genitourinary    Burning when urinating:     Blood in urine:        Psychiatric    Major depression:         Hematologic    Bleeding problems:    Problems with blood clotting too easily:        Skin    Rashes or ulcers:        Constitutional    Fever or chills:      PHYSICAL EXAMINATION:   General:  WDWN in NAD; vital signs documented  above Gait: Not observed HENT: WNL, normocephalic Pulmonary: normal non-labored breathing without wheezing Cardiac: regular HR; without carotid bruits Abdomen: soft, NT, aortic pulse is not palpable Skin: without rashes Vascular Exam/Pulses:  Right Left  Radial 2+ (normal) 2+ (normal)  DP 2+ (normal) 2+ (normal)  PT 2+ (normal) 2+ (normal)   Extremities: mild BLE swelling with some pitting edema around where her ankle socks were. No non healing wounds or weeping present.   Neurologic: A&O X 3;  moving all extremities equally Psychiatric:  The pt has Normal affect.   Non-Invasive Vascular Imaging:   Venous duplex on 09/03/2024: +--------------+---------+------+-----------+------------+--------+  LEFT         Reflux NoRefluxReflux TimeDiameter cmsComments                          Yes                                   +--------------+---------+------+-----------+------------+--------+  CFV                    yes   >1 second                       +--------------+---------+------+-----------+------------+--------+  FV mid                  yes   >1 second                       +--------------+---------+------+-----------+------------+--------+  Popliteal              yes   >1 second                       +--------------+---------+------+-----------+------------+--------+  GSV at SFJ              yes    >500 ms      0.82              +--------------+---------+------+-----------+------------+--------+  GSV prox thigh  yes    >500 ms      0.34              +--------------+---------+------+-----------+------------+--------+  GSV mid thigh           yes    >500 ms      0.32              +--------------+---------+------+-----------+------------+--------+  GSV dist thigh                              0.27              +--------------+---------+------+-----------+------------+--------+  GSV at knee             yes     >500 ms      0.38              +--------------+---------+------+-----------+------------+--------+  GSV prox calf           yes    >500 ms      0.34              +--------------+---------+------+-----------+------------+--------+  GSV mid calf            yes    >500 ms      0.29              +--------------+---------+------+-----------+------------+--------+  SSV at SPJ                                  0.2               +--------------+---------+------+-----------+------------+--------+  SSV prox calf                               0.23              +--------------+---------+------+-----------+------------+--------+   Summary:  Left:  - No evidence of deep vein thrombosis seen in the left lower extremity, from the common femoral through the popliteal veins.  - No evidence of superficial venous thrombosis in the left lower extremity.  - Venous reflux is noted in the left common femoral vein.  - Venous reflux is noted in the left sapheno-femoral junction.  - Venous reflux is noted in the left greater saphenous vein in the thigh.  - Venous reflux is noted in the left greater saphenous vein in the calf.  - Venous reflux is noted in the left femoral vein.  - Venous reflux is noted in the left popliteal vein.     Mima Cranmore is a 78 y.o. female who presents with: BLE swelling with left greater than right    -pt has easily palpable pedal pulses bilaterally.  Reassured her that her arterial flow was good and her DP/PT pulses are easily palpable in both feet.  -in the left lower extremity, the pt does not have evidence of DVT.  Pt does have venous reflux in the deep venous system as well as the GSV at the [facility] and in the GSV in thigh down to the calf, but vein is not of adequate size for laser ablation.  -discussed with her about continuing to wear her compression and put them on in the morning and take them off at night.  Encouraged her to continue to wear them on  long road trips and while walking.   -discussed  the importance of leg elevation and how to elevate properly - pt is advised to elevate their legs and a diagram is given to them to demonstrate for pt to lay flat on their back with knees elevated and slightly bent with their feet higher than their knees, which puts their feet higher than their heart for 15 minutes per day.  Discussed with her if she cannot tolerate for an extended period, she can elevate what she can tolerate.   -pt is advised to continue as much walking as possible and avoid sitting or standing for long periods of time.  She is walking every morning.  -discussed importance of exercise and that water aerobics would also be beneficial.  She has access to a pool at her church.   -handout with recommendations given -pt will f/u as needed.   Lucie Apt, [facility] [phone]  Clinic MD:  [place]

## 2024-10-22 ENCOUNTER — Ambulatory Visit: Admitting: Podiatry

## 2024-10-22 ENCOUNTER — Encounter: Payer: Self-pay | Admitting: Podiatry

## 2024-10-22 ENCOUNTER — Ambulatory Visit (INDEPENDENT_AMBULATORY_CARE_PROVIDER_SITE_OTHER)

## 2024-10-22 DIAGNOSIS — M25372 Other instability, left ankle: Secondary | ICD-10-CM

## 2024-10-22 DIAGNOSIS — G8929 Other chronic pain: Secondary | ICD-10-CM | POA: Diagnosis not present

## 2024-10-22 DIAGNOSIS — M25572 Pain in left ankle and joints of left foot: Secondary | ICD-10-CM

## 2024-10-22 DIAGNOSIS — M216X9 Other acquired deformities of unspecified foot: Secondary | ICD-10-CM

## 2024-10-22 NOTE — Progress Notes (Unsigned)
 Subjective: Chief Complaint  Patient presents with   Foot Pain    Anterior ankle pain. Sharp pain. Wearing new sneakers([employer]) 7 pain. Did P/T.   78 year old female presents the office today above concerns.  She is been getting increased discomfort to her left ankle and she feels more unstable.  She does not report any recent injuries or changes to this area but seems to be getting worse gradually.  She also gets ongoing pain to the ball of her right foot pointing to submetatarsal 1.  She is interested in new inserts or something to help decrease the pressure and help with her pain.  Objective: AAO x3, NAD DP/PT pulses palpable bilaterally, CRT less than 3 seconds She has tenderness palpation on anterior lateral aspect the left ankle.  There is an increased anterior drawer but is also positive bilaterally.  There is no significant edema or erythema.  There is no area of pinpoint tenderness.  Ankle range of motion intact.  On the right foot there is tenderness submetatarsal 1.  There is no specific pinpoint tenderness. No pain with calf compression, swelling, warmth, erythema  Assessment: Left ankle instability, cavus foot structure  Plan: -All treatment options discussed with the patient including all alternatives, risks, complications.  -Repeat x-rays were obtained and reviewed.  There is no evidence of acute fracture.  Narrowing at the first MTPJ.  Previous was fracture present. -Given ongoing nature of her symptoms have recommended and ordered an MRI of the left ankle given the ankle instability and ongoing pain.  She is attempted to modifications, physical therapy, multiple orthotics. -We did check on orthotic coverage and see if she is able to get another pair this year.  If needed we will get a new orthotic with a different top-cover to help offload submetatarsal 1. -Patient encouraged to call the office with any questions, concerns, change in symptoms.   No follow-ups on  file.  Donnice JONELLE Fees DPM

## 2024-10-24 NOTE — Telephone Encounter (Signed)
 Prentice- can you check to see if she had one or two pairs of orthotics ordered this year? If she has only had one, she states she gets 2 per year and would like another pair. If we do another pair, I would like to make some adjustments to the order. Thanks!

## 2024-11-07 ENCOUNTER — Encounter: Payer: Self-pay | Admitting: Podiatry

## 2024-11-12 ENCOUNTER — Other Ambulatory Visit

## 2024-11-17 ENCOUNTER — Inpatient Hospital Stay: Admission: RE | Admit: 2024-11-17 | Discharge: 2024-11-17 | Attending: Podiatry

## 2024-11-17 DIAGNOSIS — M25372 Other instability, left ankle: Secondary | ICD-10-CM

## 2024-12-03 ENCOUNTER — Ambulatory Visit: Payer: Self-pay | Admitting: Podiatry

## 2024-12-05 NOTE — Telephone Encounter (Signed)
 I am not sure who can help us  with this but can we verify if the department of labor (workers comp) is going to pay for another set of inserts this year? She has an appt Monday. Thanks!

## 2024-12-10 ENCOUNTER — Ambulatory Visit: Admitting: Podiatry

## 2024-12-10 DIAGNOSIS — M25372 Other instability, left ankle: Secondary | ICD-10-CM | POA: Diagnosis not present

## 2024-12-10 DIAGNOSIS — M25572 Pain in left ankle and joints of left foot: Secondary | ICD-10-CM | POA: Diagnosis not present

## 2024-12-10 DIAGNOSIS — G8929 Other chronic pain: Secondary | ICD-10-CM | POA: Diagnosis not present

## 2024-12-10 DIAGNOSIS — M216X9 Other acquired deformities of unspecified foot: Secondary | ICD-10-CM

## 2024-12-12 NOTE — Progress Notes (Signed)
 Subjective: Chief Complaint  Patient presents with   Foot Orthotics    79 year old female presents the office today above concerns.  States that she still gets unsteadiness on her feet.  She presents today to discuss the MRI but also given ongoing pain to the right foot.  She states that when she wears a small stockinette underneath the sock it takes the pressure off and helps more than anything she has tried so far.  Still waiting approval for new inserts  Objective: AAO x3, NAD DP/PT pulses palpable bilaterally, CRT less than 3 seconds She has tenderness palpation on anterior lateral aspect the left ankle.  There is no specific area of pinpoint tenderness.  No edema, erythema.  On the right foot there is prominence of the first metatarsal head in all the metatarsals mostly submetatarsal 1 resulting in a hyperkeratotic lesion.  There is no skin breakdown.  This seems to be the where she has majority of her tenderness at this time.  Digital contractures present. No pain with calf compression, swelling, warmth, erythema  Assessment: Left ankle instability, cavus foot structure  Plan: -All treatment options discussed with the patient including all alternatives, risks, complications.  -Reviewed the MRI.  I do think she is continue orthotics and we are still working on trying to get insurance approval for orthotics.  Today I did modify her existing orthotics and add a small piece of cushioning right foot submetatarsal 1 to see if this will decrease the pressure for now.  Continue to try to stay active as much as possible. -Moisturizing of the calluses  Return in about 6 weeks (around 01/21/2025).  Donnice JONELLE Fees DPM

## 2024-12-14 ENCOUNTER — Encounter: Payer: Self-pay | Admitting: Podiatry

## 2024-12-16 NOTE — Telephone Encounter (Signed)
 Rebecca Lewis, any update on this? Thanks!
